# Patient Record
Sex: Male | Born: 1950 | Race: White | Hispanic: No | Marital: Married | Smoking: Former smoker
Health system: Southern US, Community
[De-identification: ages and names within clinical notes are randomized; demographics above are authoritative.]

## PROBLEM LIST (undated history)

## (undated) DIAGNOSIS — K56699 Other intestinal obstruction unspecified as to partial versus complete obstruction: Secondary | ICD-10-CM

## (undated) DIAGNOSIS — K219 Gastro-esophageal reflux disease without esophagitis: Secondary | ICD-10-CM

## (undated) DIAGNOSIS — Z720 Tobacco use: Secondary | ICD-10-CM

## (undated) HISTORY — DX: Tobacco use: Z72.0

## (undated) HISTORY — PX: TONSILLECTOMY: SUR1361

## (undated) HISTORY — PX: VASECTOMY: SHX75

## (undated) HISTORY — DX: Other intestinal obstruction unspecified as to partial versus complete obstruction: K56.699

---

## 2022-03-06 ENCOUNTER — Other Ambulatory Visit: Payer: Self-pay

## 2022-03-06 ENCOUNTER — Emergency Department (HOSPITAL_COMMUNITY)
Admission: EM | Admit: 2022-03-06 | Discharge: 2022-03-06 | Disposition: A | Discharge status: Home / Self Care | Payer: Medicare Other | Attending: Emergency Medicine | Admitting: Emergency Medicine

## 2022-03-06 ENCOUNTER — Encounter (HOSPITAL_COMMUNITY): Payer: Self-pay | Admitting: *Deleted

## 2022-03-06 ENCOUNTER — Emergency Department (HOSPITAL_COMMUNITY): Payer: Medicare Other

## 2022-03-06 DIAGNOSIS — Z7982 Long term (current) use of aspirin: Secondary | ICD-10-CM | POA: Diagnosis not present

## 2022-03-06 DIAGNOSIS — K5900 Constipation, unspecified: Secondary | ICD-10-CM | POA: Diagnosis not present

## 2022-03-06 DIAGNOSIS — R110 Nausea: Secondary | ICD-10-CM | POA: Insufficient documentation

## 2022-03-06 DIAGNOSIS — R1013 Epigastric pain: Secondary | ICD-10-CM | POA: Insufficient documentation

## 2022-03-06 LAB — CBC WITH DIFFERENTIAL/PLATELET
Abs Immature Granulocytes: 0.02 10*3/uL (ref 0.00–0.07)
Basophils Absolute: 0.1 10*3/uL (ref 0.0–0.1)
Basophils Relative: 2 %
Eosinophils Absolute: 0.3 10*3/uL (ref 0.0–0.5)
Eosinophils Relative: 4 %
HCT: 41.3 % (ref 39.0–52.0)
Hemoglobin: 13.8 g/dL (ref 13.0–17.0)
Immature Granulocytes: 0 %
Lymphocytes Relative: 28 %
Lymphs Abs: 2.1 10*3/uL (ref 0.7–4.0)
MCH: 32.3 pg (ref 26.0–34.0)
MCHC: 33.4 g/dL (ref 30.0–36.0)
MCV: 96.7 fL (ref 80.0–100.0)
Monocytes Absolute: 0.6 10*3/uL (ref 0.1–1.0)
Monocytes Relative: 8 %
Neutro Abs: 4.3 10*3/uL (ref 1.7–7.7)
Neutrophils Relative %: 58 %
Platelets: 331 10*3/uL (ref 150–400)
RBC: 4.27 MIL/uL (ref 4.22–5.81)
RDW: 12.7 % (ref 11.5–15.5)
WBC: 7.5 10*3/uL (ref 4.0–10.5)
nRBC: 0 % (ref 0.0–0.2)

## 2022-03-06 LAB — COMPREHENSIVE METABOLIC PANEL
ALT: 8 U/L (ref 0–44)
AST: 17 U/L (ref 15–41)
Albumin: 4 g/dL (ref 3.5–5.0)
Alkaline Phosphatase: 79 U/L (ref 38–126)
Anion gap: 5 (ref 5–15)
BUN: 17 mg/dL (ref 8–23)
CO2: 27 mmol/L (ref 22–32)
Calcium: 9.5 mg/dL (ref 8.9–10.3)
Chloride: 108 mmol/L (ref 98–111)
Creatinine, Ser: 0.97 mg/dL (ref 0.61–1.24)
GFR, Estimated: >60 mL/min (ref >60)
Glucose, Bld: 108 mg/dL — ABNORMAL HIGH (ref 70–99)
Potassium: 4.5 mmol/L (ref 3.5–5.1)
Sodium: 140 mmol/L (ref 135–145)
Total Bilirubin: 1.8 mg/dL — ABNORMAL HIGH (ref 0.3–1.2)
Total Protein: 7.1 g/dL (ref 6.5–8.1)

## 2022-03-06 LAB — LIPASE, BLOOD: Lipase: 42 U/L (ref 11–51)

## 2022-03-06 MED ORDER — ALUM & MAG HYDROXIDE-SIMETH 200-200-20 MG/5ML PO SUSP
30.0000 mL | Freq: Once | ORAL | Status: AC
  Administered 2022-03-06: 30 mL via ORAL
  Filled 2022-03-06: qty 30

## 2022-03-06 MED ORDER — FAMOTIDINE 20 MG PO TABS
20.0000 mg | ORAL_TABLET | Freq: Once | ORAL | Status: AC
  Administered 2022-03-06: 20 mg via ORAL
  Filled 2022-03-06: qty 1

## 2022-03-06 MED ORDER — OMEPRAZOLE 20 MG PO CPDR: 20.0000 mg | DELAYED_RELEASE_CAPSULE | Freq: Every day | ORAL | 0 refills | Status: DC

## 2022-03-06 NOTE — ED Provider Notes (Signed)
?[facility] ?Provider Note ? ? ?CSN: [id number] ?Arrival date & time: 03/06/22  0951 ? ?  ? ?History ? ?Chief Complaint  ?Patient presents with  ? Abdominal Pain  ? ? ?Nicholas Dominguez is a 71 y.o. male. ? ?HPI ? ?Without medical history presents  with complaints of epigastric tenderness.  has been going on for the last year but has gotten more frequent over the last couple weeks, he states pain is mainly his epigastric region described as a cramping like sensation, he notes its worsen after he lays down or is on his side, he notices worsening after large meals or few hours after he eats, he states he will have intermittent nausea without vomiting, he states that he has been slightly constipated last couple days last bowel movement was on Sunday, he has no severe abdominal history, denies alcohol use, does not smoke, he does state that he uses NSAIDs regularly, he has no history of stomach ulcers, no history of pancreatitis, he has no cardiac history, not treated for hypertension or diabetes, he does note that he does not frequent the doctor often.  He denies ever having any chest pain shortness of breath does not describe this pain as a stabbing or tearing like sensation does not rating to the back. ? ?Wife is at bedside able to validate the story. ? ?Home Medications ?Prior to Admission medications   ?Medication Sig Start Date End Date Taking? Authorizing Provider  ?aspirin 81 MG EC tablet Take 81 mg by mouth daily.   Yes [provider]  ?Multiple Vitamin (MULTI VITAMIN) TABS Take 1 tablet by mouth daily.   Yes [provider]  ?niacin 50 MG tablet Take 1 tablet by mouth daily.   Yes [provider]  ?Omega-3 Fatty Acids (FISH OIL) 1000 MG CAPS Take 1 capsule by mouth daily.   Yes [provider]  ?omeprazole (PRILOSEC) 20 MG capsule Take 1 capsule (20 mg total) by mouth daily. 03/06/22 04/05/22 Yes Marcello Fennel, PA-C  ?   ? ?Allergies    ?Patient has no  known allergies.   ? ?Review of Systems   ?Review of Systems  ?Constitutional:  Negative for chills and fever.  ?Respiratory:  Negative for shortness of breath.   ?Cardiovascular:  Negative for chest pain.  ?Gastrointestinal:  Positive for abdominal pain and constipation. Negative for nausea and vomiting.  ?Neurological:  Negative for headaches.  ? ?Physical Exam ?Updated Vital Signs ?BP 135/71   Pulse 71   Temp 97.8 ?F (36.6 ?C) (Oral)   Resp 17   Ht '5\' 11"'$  (1.803 m)   Wt 89.1 kg   SpO2 98%   BMI 27.41 kg/m?  ?Physical Exam ?Vitals and nursing note reviewed.  ?Constitutional:   ?   General: He is not in acute distress. ?   Appearance: He is not ill-appearing.  ?HENT:  ?   Head: Normocephalic and atraumatic.  ?   Nose: No congestion.  ?Eyes:  ?   Conjunctiva/sclera: Conjunctivae normal.  ?Cardiovascular:  ?   Rate and Rhythm: Normal rate and regular rhythm.  ?   Pulses: Normal pulses.  ?   Heart sounds: No murmur heard. ?  No friction rub. No gallop.  ?Pulmonary:  ?   Effort: No respiratory distress.  ?   Breath sounds: No wheezing, rhonchi or rales.  ?Abdominal:  ?   Palpations: Abdomen is soft.  ?   Tenderness: There is abdominal tenderness. There is no right CVA  tenderness or left CVA tenderness.  ?   Comments: Abdomen nondistended no active bowel sounds dull to percussion, there is no guarding rebound tenderness or peritoneal sign negative Murphy sign McBurney point, has slight tenderness in the lower abdomen.  ?Musculoskeletal:  ?   Right lower leg: No edema.  ?   Left lower leg: No edema.  ?Skin: ?   General: Skin is warm and dry.  ?Neurological:  ?   Mental Status: He is alert.  ?Psychiatric:     ?   Mood and Affect: Mood normal.  ? ? ?ED Results / Procedures / Treatments   ?Labs ?(all labs ordered are listed, but only abnormal results are displayed) ?Labs Reviewed  ?COMPREHENSIVE METABOLIC PANEL - Abnormal; Notable for the following components:  ?    Result Value  ? Glucose, Bld 108 (*)   ? Total  Bilirubin 1.8 (*)   ? All other components within normal limits  ?CBC WITH DIFFERENTIAL/PLATELET  ?LIPASE, BLOOD  ? ? ?EKG ?EKG Interpretation ? ?Date/Time:  Monday Mar 06 2022 11:08:32 EDT ?Ventricular Rate:  72 ?PR Interval:  188 ?QRS Duration: 102 ?QT Interval:  413 ?QTC Calculation: 452 ?R Axis:   51 ?Text Interpretation: Sinus rhythm No old tracing to compare Confirmed by Daleen Bo [phone]) on 03/06/2022 11:23:50 AM ? ?Radiology ?DG Abdomen Acute W/Chest ? ?Result Date: 03/06/2022 ?CLINICAL DATA:  Chest pain, epigastric pain EXAM: DG ABDOMEN ACUTE WITH 1 VIEW CHEST COMPARISON:  None Available. FINDINGS: Cardiac size is within normal limits. Left hemidiaphragm is elevated. Lung fields are clear of any infiltrates or pulmonary edema. There is no pleural effusion or pneumothorax. Bowel gas pattern is nonspecific. There is no pneumoperitoneum. Small amount of stool is seen in the colon. There is no fecal impaction in the rectosigmoid. No abnormal masses or calcifications are seen. Kidneys are partly obscured by bowel contents. Degenerative changes are noted in the lumbar spine. IMPRESSION: Nonspecific bowel gas pattern. No abnormal masses or calcifications are seen. There are no focal pulmonary infiltrates. Electronically Signed   By: Elmer Picker M.D.   On: 03/06/2022 11:51   ? ?Procedures ?Procedures  ? ? ?Medications Ordered in ED ?Medications  ?alum & mag hydroxide-simeth (MAALOX/MYLANTA) 200-200-20 MG/5ML suspension 30 mL (30 mLs Oral Given 03/06/22 1104)  ?famotidine (PEPCID) tablet 20 mg (20 mg Oral Given 03/06/22 1104)  ? ? ?ED Course/ Medical Decision Making/ A&P ?  ?                        ?Medical Decision Making ?Amount and/or Complexity of Data Reviewed ?Labs: ordered. ?Radiology: ordered. ? ?Risk ?OTC drugs. ? ? ?This patient presents to the ED for concern of epigastric pain, this involves an extensive number of treatment options, and is a complaint that carries with it a high risk of complications  and morbidity.  The differential diagnosis includes ACS, dissection, GERD, obstruction ? ? ? ?Additional history obtained: ? ?Additional history obtained from wife at bedside ?External records from outside source obtained and reviewed including N/A ? ? ?Co morbidities that complicate the patient evaluation ? ?N/A ? ?Social Determinants of Health: ? ?Does not see a primary care provider-we will provide 1 after discharge ? ? ? ?Lab Tests: ? ?I Ordered, and personally interpreted labs.  The pertinent results include: CBC unremarkable, CMP showed glucose of 108, T. bili 1.8, lipase 42 ? ? ?Imaging Studies ordered: ? ?I ordered imaging studies including acute chest abdomen ?I independently  visualized and interpreted imaging which showed negative acute findings ?I agree with the radiologist interpretation ? ? ?Cardiac Monitoring: ? ?The patient was maintained on a cardiac monitor.  I personally viewed and interpreted the cardiac monitored which showed an underlying rhythm of: EKG without signs of ischemia ? ? ?Medicines ordered and prescription drug management: ? ?I ordered medication including GI cocktail, Pepcid ?I have reviewed the patients home medicines and have made adjustments as needed ? ?Critical Interventions: ? ?N/A ? ? ?Reevaluation: ? ?Presents with epigastric tenderness, he has slight tenderness in his lower abdomen, endorses constipation, unclear etiology suspects likely GERD/hiatal hernia will obtain basic lab work-up imaging provide GI cocktail and reassess. ? ?Reassessed resting comfortably having no complaints, he is ready for discharge. ? ? ?Consultations Obtained: ? ?N/A ? ? ? ?Test Considered: ? ?CT and pelvis-we will defer as my suspicion for intra-abdominal abnormality is very low at this time, he has nonsurgical abdomen, abdomen soft nontender, lab work is unremarkable. ? ? ? ?Rule out ?I have low suspicion for ACS he denies any chest pain, shortness of breath, he has low risk factors, EKG without  signs of ischemia.  I have low suspicion for AAA/dissection as presentation atypical, no bulging mass, pain is not midline, does not rating to the back, is provoked with p.o. intake.low suspicion for lower lobe pn

## 2022-03-06 NOTE — ED Triage Notes (Signed)
Pt c/o epigastric pain that has gotten worse; pt describes the pain as a cramping feeling and states he has been belching more than usual ?

## 2022-03-06 NOTE — Discharge Instructions (Signed)
Lab work and exam are reassuring, possible you have GERD/hiatal hernia, start you on an acid pill please take as prescribed. ? ?Regards to your constipation  recommend taking MiraLAX 1 capful daily for the next 7 days please hydrate as this will only work if you are hydrated. ? ?Please follow-up with GI for further evaluation. ? ?Come back to the emergency department if you develop chest pain, shortness of breath, severe abdominal pain, uncontrolled nausea, vomiting, diarrhea. ? ?

## 2022-03-06 NOTE — ED Provider Notes (Signed)
?  Face-to-face evaluation ? ? ?History: Patient presenting with ongoing symptoms, worsening in frequency consistent with dyspepsia.  He is taking omeprazole without relief.  He occasionally uses Pepto-Bismol with partial relief.  Symptoms tend to occur more at nighttime ? ?Physical exam: Alert, calm, cooperative.  No respiratory distress.  Abdomen soft and nontender.  Findings discussed with the patient and all questions were answered ? ?MDM: Evaluation for  ?Chief Complaint  ?Patient presents with  ? Abdominal Pain  ?  ? ?Patient with signs and symptoms of GERD, ongoing for several weeks.  He is currently taking omeprazole without relief.  He is stable for discharge.  Will increase treatment regimen with Protonix twice daily and supplement that with antacid of choice.  Referred to GI for further evaluation and possible EGD ? ?Medical screening examination/treatment/procedure(s) were conducted as a shared visit with non-physician practitioner(s) and myself.  I personally evaluated the patient during the encounter ? ?  ?Daleen Bo, MD ?03/07/22 1524 ? ?

## 2022-03-07 ENCOUNTER — Telehealth: Payer: Self-pay | Admitting: Internal Medicine

## 2022-03-07 NOTE — Telephone Encounter (Signed)
ER REFERRAL  

## 2022-03-15 ENCOUNTER — Ambulatory Visit (INDEPENDENT_AMBULATORY_CARE_PROVIDER_SITE_OTHER): Payer: Medicare Other | Admitting: Internal Medicine

## 2022-03-15 ENCOUNTER — Encounter: Payer: Self-pay | Admitting: Internal Medicine

## 2022-03-15 ENCOUNTER — Other Ambulatory Visit: Payer: Self-pay

## 2022-03-15 VITALS — BP 130/77 | HR 74 | Temp 97.5°F | Ht 71.0 in | Wt 193.6 lb

## 2022-03-15 DIAGNOSIS — G8929 Other chronic pain: Secondary | ICD-10-CM

## 2022-03-15 DIAGNOSIS — K219 Gastro-esophageal reflux disease without esophagitis: Secondary | ICD-10-CM | POA: Diagnosis not present

## 2022-03-15 DIAGNOSIS — Z1211 Encounter for screening for malignant neoplasm of colon: Secondary | ICD-10-CM | POA: Diagnosis not present

## 2022-03-15 DIAGNOSIS — R197 Diarrhea, unspecified: Secondary | ICD-10-CM

## 2022-03-15 DIAGNOSIS — R1013 Epigastric pain: Secondary | ICD-10-CM | POA: Diagnosis not present

## 2022-03-15 DIAGNOSIS — R634 Abnormal weight loss: Secondary | ICD-10-CM

## 2022-03-15 NOTE — H&P (View-Only) (Signed)
Primary Care Physician:  Pcp, No Primary Gastroenterologist:  Dr. Abbey Chatters  Chief Complaint  Patient presents with   Abdominal Pain    Was given omeprazole 20 mg once daily in the ER. Was seeing some improvement until Sat when he went to [employer] and ate [place].     HPI:   Nicholas Dominguez is a 71 y.o. male who presents to clinic today for ER referral.  Patient states he has had abdominal pain new onset for the last month.  Primarily epigastric and periumbilical.  Comes and goes though has progressively worsened to now as it is occurring every day.  Also notes change in bowel movements with constipation and diarrhea.  No melena hematochezia.  Does note nearly 30 pound weight loss.  States his appetite is poor.  Eating does make his symptoms worse.  Has been on omeprazole which does not seem to help.  No dysphagia or odynophagia.  Does notice occasional reflux at night.  Symptoms of gotten so bad he cannot lay flat at times.  Work-up in the ER unremarkable including blood work.  Abdominal x-ray without obstruction.  No nausea or vomiting.  No previous upper endoscopy or colonoscopy.  No colon cancer modality.  Relatively healthy, does not take any medications besides a daily baby aspirin.  No family history of colorectal malignancy.  Past Medical History:  Diagnosis Date   Tobacco use     Past Surgical History:  Procedure Laterality Date   TONSILLECTOMY     VASECTOMY      Current Outpatient Medications  Medication Sig Dispense Refill   aspirin 81 MG EC tablet Take 81 mg by mouth daily.     Multiple Vitamin (MULTI VITAMIN) TABS Take 1 tablet by mouth daily.     niacin 500 MG tablet Take 1 tablet by mouth daily.     Omega-3 Fatty Acids (FISH OIL) 1000 MG CAPS Take 1 capsule by mouth daily.     omeprazole (PRILOSEC) 20 MG capsule Take 1 capsule (20 mg total) by mouth daily. 30 capsule 0   No current facility-administered medications for this visit.    Allergies as of 03/15/2022    (No Known Allergies)    Family History  Problem Relation Age of Onset   Heart disease Mother    Prostate cancer Father    Stroke Father     Social History   Socioeconomic History   Marital status: Married    Spouse name: Not on file   Number of children: Not on file   Years of education: Not on file   Highest education level: Not on file  Occupational History   Not on file  Tobacco Use   Smoking status: Some Days    Packs/day: 0.25    Types: Cigarettes   Smokeless tobacco: Never  Vaping Use   Vaping Use: Never used  Substance and Sexual Activity   Alcohol use: Not Currently   Drug use: Not Currently   Sexual activity: Yes  Other Topics Concern   Not on file  Social History Narrative   Not on file   Social Determinants of Health   Financial Resource Strain: Not on file  Food Insecurity: Not on file  Transportation Needs: Not on file  Physical Activity: Not on file  Stress: Not on file  Social Connections: Not on file  Intimate Partner Violence: Not on file    Subjective: Review of Systems  Constitutional:  Negative for chills and fever.  HENT:  Negative for congestion and hearing loss.   Eyes:  Negative for blurred vision and double vision.  Respiratory:  Negative for cough and shortness of breath.   Cardiovascular:  Negative for chest pain and palpitations.  Gastrointestinal:  Positive for abdominal pain. Negative for blood in stool, constipation, diarrhea, heartburn, melena and vomiting.  Genitourinary:  Negative for dysuria and urgency.  Musculoskeletal:  Negative for joint pain and myalgias.  Skin:  Negative for itching and rash.  Neurological:  Negative for dizziness and headaches.  Psychiatric/Behavioral:  Negative for depression. The patient is not nervous/anxious.       Objective: BP 130/77 (BP Location: Right Arm, Patient Position: Sitting, Cuff Size: Normal)   Pulse 74   Temp (!) 97.5 F (36.4 C) (Temporal)   Ht '5\' 11"'$  (1.803 m)   Wt 193  lb 9.6 oz (87.8 kg)   SpO2 99%   BMI 27.00 kg/m  Physical Exam Constitutional:      Appearance: Normal appearance.  HENT:     Head: Normocephalic and atraumatic.  Eyes:     Extraocular Movements: Extraocular movements intact.     Conjunctiva/sclera: Conjunctivae normal.  Cardiovascular:     Rate and Rhythm: Normal rate and regular rhythm.  Pulmonary:     Effort: Pulmonary effort is normal.     Breath sounds: Normal breath sounds.  Abdominal:     General: Bowel sounds are normal.     Palpations: Abdomen is soft.     Tenderness: There is abdominal tenderness.  Musculoskeletal:        General: Normal range of motion.     Cervical back: Normal range of motion and neck supple.  Skin:    General: Skin is warm.  Neurological:     General: No focal deficit present.     Mental Status: He is alert and oriented to person, place, and time.  Psychiatric:        Mood and Affect: Mood normal.        Behavior: Behavior normal.     Assessment: *Abdominal pain-worsening *GERD *Weight loss *Colon cancer screening *Constipation mixed with diarrhea  Plan: Etiology of patient's symptoms unclear.  Given the acute worsening nature of his symptoms as well as abdominal tenderness today, will order CT abdomen pelvis with contrast to further evaluate.  If unremarkable we will proceed with EGD to evaluate for peptic ulcer disease, esophagitis, gastritis, H. Pylori, duodenitis, or other. Will also evaluate for esophageal stricture, Schatzki's ring, esophageal web or other.   At the same time, I will perform colonoscopy for both colon cancer screening purposes as well as to evaluate for weight loss, change in bowel habits.  The risks including infection, bleed, or perforation as well as benefits, limitations, alternatives and imponderables have been reviewed with the patient. Potential for esophageal dilation, biopsy, etc. have also been reviewed.  Questions have been answered. All parties  agreeable.  Continue on omeprazole daily for now.  May need to make adjustments pending above work-up.  03/15/2022 2:16 PM   Disclaimer: This note was dictated with voice recognition software. Similar sounding words can inadvertently be transcribed and may not be corrected upon review.

## 2022-03-15 NOTE — Progress Notes (Signed)
? ? ?Primary Care Physician:  Pcp, No ?Primary Gastroenterologist:  Dr. Abbey Chatters ? ?Chief Complaint  ?Patient presents with  ? Abdominal Pain  ?  Was given omeprazole 20 mg once daily in the ER. Was seeing some improvement until Sat when he went to [employer] and ate [place].   ? ? ?HPI:   ?Nicholas Dominguez is a 71 y.o. male who presents to clinic today for ER referral.  Patient states he has had abdominal pain new onset for the last month.  Primarily epigastric and periumbilical.  Comes and goes though has progressively worsened to now as it is occurring every day.  Also notes change in bowel movements with constipation and diarrhea.  No melena hematochezia.  Does note nearly 30 pound weight loss.  States his appetite is poor.  Eating does make his symptoms worse.  Has been on omeprazole which does not seem to help.  No dysphagia or odynophagia.  Does notice occasional reflux at night.  Symptoms of gotten so bad he cannot lay flat at times.  Work-up in the ER unremarkable including blood work.  Abdominal x-ray without obstruction.  No nausea or vomiting.  No previous upper endoscopy or colonoscopy.  No colon cancer modality.  Relatively healthy, does not take any medications besides a daily baby aspirin.  No family history of colorectal malignancy. ? ?Past Medical History:  ?Diagnosis Date  ? Tobacco use   ? ? ?Past Surgical History:  ?Procedure Laterality Date  ? TONSILLECTOMY    ? VASECTOMY    ? ? ?Current Outpatient Medications  ?Medication Sig Dispense Refill  ? aspirin 81 MG EC tablet Take 81 mg by mouth daily.    ? Multiple Vitamin (MULTI VITAMIN) TABS Take 1 tablet by mouth daily.    ? niacin 500 MG tablet Take 1 tablet by mouth daily.    ? Omega-3 Fatty Acids (FISH OIL) 1000 MG CAPS Take 1 capsule by mouth daily.    ? omeprazole (PRILOSEC) 20 MG capsule Take 1 capsule (20 mg total) by mouth daily. 30 capsule 0  ? ?No current facility-administered medications for this visit.  ? ? ?Allergies as of 03/15/2022  ?  (No Known Allergies)  ? ? ?Family History  ?Problem Relation Age of Onset  ? Heart disease Mother   ? Prostate cancer Father   ? Stroke Father   ? ? ?Social History  ? ?Socioeconomic History  ? Marital status: Married  ?  Spouse name: Not on file  ? Number of children: Not on file  ? Years of education: Not on file  ? Highest education level: Not on file  ?Occupational History  ? Not on file  ?Tobacco Use  ? Smoking status: Some Days  ?  Packs/day: 0.25  ?  Types: Cigarettes  ? Smokeless tobacco: Never  ?Vaping Use  ? Vaping Use: Never used  ?Substance and Sexual Activity  ? Alcohol use: Not Currently  ? Drug use: Not Currently  ? Sexual activity: Yes  ?Other Topics Concern  ? Not on file  ?Social History Narrative  ? Not on file  ? ?Social Determinants of Health  ? ?Financial Resource Strain: Not on file  ?Food Insecurity: Not on file  ?Transportation Needs: Not on file  ?Physical Activity: Not on file  ?Stress: Not on file  ?Social Connections: Not on file  ?Intimate Partner Violence: Not on file  ? ? ?Subjective: ?Review of Systems  ?Constitutional:  Negative for chills and fever.  ?HENT:  Negative for congestion and hearing loss.   ?Eyes:  Negative for blurred vision and double vision.  ?Respiratory:  Negative for cough and shortness of breath.   ?Cardiovascular:  Negative for chest pain and palpitations.  ?Gastrointestinal:  Positive for abdominal pain. Negative for blood in stool, constipation, diarrhea, heartburn, melena and vomiting.  ?Genitourinary:  Negative for dysuria and urgency.  ?Musculoskeletal:  Negative for joint pain and myalgias.  ?Skin:  Negative for itching and rash.  ?Neurological:  Negative for dizziness and headaches.  ?Psychiatric/Behavioral:  Negative for depression. The patient is not nervous/anxious.    ? ? ? ?Objective: ?BP 130/77 (BP Location: Right Arm, Patient Position: Sitting, Cuff Size: Normal)   Pulse 74   Temp (!) 97.5 ?F (36.4 ?C) (Temporal)   Ht '5\' 11"'$  (1.803 m)   Wt 193  lb 9.6 oz (87.8 kg)   SpO2 99%   BMI 27.00 kg/m?  ?Physical Exam ?Constitutional:   ?   Appearance: Normal appearance.  ?HENT:  ?   Head: Normocephalic and atraumatic.  ?Eyes:  ?   Extraocular Movements: Extraocular movements intact.  ?   Conjunctiva/sclera: Conjunctivae normal.  ?Cardiovascular:  ?   Rate and Rhythm: Normal rate and regular rhythm.  ?Pulmonary:  ?   Effort: Pulmonary effort is normal.  ?   Breath sounds: Normal breath sounds.  ?Abdominal:  ?   General: Bowel sounds are normal.  ?   Palpations: Abdomen is soft.  ?   Tenderness: There is abdominal tenderness.  ?Musculoskeletal:     ?   General: Normal range of motion.  ?   Cervical back: Normal range of motion and neck supple.  ?Skin: ?   General: Skin is warm.  ?Neurological:  ?   General: No focal deficit present.  ?   Mental Status: He is alert and oriented to person, place, and time.  ?Psychiatric:     ?   Mood and Affect: Mood normal.     ?   Behavior: Behavior normal.  ? ? ? ?Assessment: ?*Abdominal pain-worsening ?*GERD ?*Weight loss ?*Colon cancer screening ?*Constipation mixed with diarrhea ? ?Plan: ?Etiology of patient's symptoms unclear.  Given the acute worsening nature of his symptoms as well as abdominal tenderness today, will order CT abdomen pelvis with contrast to further evaluate. ? ?If unremarkable we will proceed with EGD to evaluate for peptic ulcer disease, esophagitis, gastritis, H. Pylori, duodenitis, or other. Will also evaluate for esophageal stricture, Schatzki's ring, esophageal web or other.  ? ?At the same time, I will perform colonoscopy for both colon cancer screening purposes as well as to evaluate for weight loss, change in bowel habits. ? ?The risks including infection, bleed, or perforation as well as benefits, limitations, alternatives and imponderables have been reviewed with the patient. Potential for esophageal dilation, biopsy, etc. have also been reviewed.  Questions have been answered. All parties  agreeable. ? ?Continue on omeprazole daily for now.  May need to make adjustments pending above work-up. ? ?03/15/2022 2:16 PM ? ? ?Disclaimer: This note was dictated with voice recognition software. Similar sounding words can inadvertently be transcribed and may not be corrected upon review. ? ?

## 2022-03-15 NOTE — Progress Notes (Unsigned)
T

## 2022-03-15 NOTE — Patient Instructions (Signed)
I am going to order CT of your abdomen pelvis to further evaluate your abdominal pain.  We will call you with these results. ? ?If this is unremarkable, we will proceed with upper endoscopy and colonoscopy to further evaluate. ? ?Continue on omeprazole daily for now.  We may need to make adjustments pending above work-up. ? ?If you have acute worsening of your symptoms, would recommend you go to the ER for further evaluation. ? ?It was very nice meeting both of you today. ? ?Dr. Abbey Chatters ? ?At [facility] we value your feedback. You may receive a survey about your visit today. Please share your experience as we strive to create trusting relationships with our patients to provide genuine, compassionate, quality care. ? ?We appreciate your understanding and patience as we review any laboratory studies, imaging, and other diagnostic tests that are ordered as we care for you. Our office policy is 5 business days for review of these results, and any emergent or urgent results are addressed in a timely manner for your best interest. If you do not hear from our office in 1 week, please contact us.  ? ?We also encourage the use of MyChart, which contains your medical information for your review as well. If you are not enrolled in this feature, an access code is on this after visit summary for your convenience. Thank you for allowing [place] to be involved in your care. ? ?It was great to see you today!  I hope you have a great rest of your Spring! ? ? ? ?Nicholas Dominguez. Abbey Chatters, D.O. ?Gastroenterology and Hepatology ?[facility] ? ?

## 2022-03-22 ENCOUNTER — Ambulatory Visit (HOSPITAL_COMMUNITY)
Admission: RE | Admit: 2022-03-22 | Discharge: 2022-03-22 | Disposition: A | Discharge status: Home / Self Care | Payer: Medicare Other | Source: Ambulatory Visit | Attending: Internal Medicine | Admitting: Internal Medicine

## 2022-03-22 ENCOUNTER — Encounter (HOSPITAL_COMMUNITY): Payer: Self-pay | Admitting: Radiology

## 2022-03-22 DIAGNOSIS — R634 Abnormal weight loss: Secondary | ICD-10-CM | POA: Insufficient documentation

## 2022-03-22 DIAGNOSIS — R197 Diarrhea, unspecified: Secondary | ICD-10-CM | POA: Diagnosis present

## 2022-03-22 DIAGNOSIS — G8929 Other chronic pain: Secondary | ICD-10-CM | POA: Diagnosis present

## 2022-03-22 DIAGNOSIS — R1013 Epigastric pain: Secondary | ICD-10-CM | POA: Insufficient documentation

## 2022-03-22 MED ORDER — IOHEXOL 300 MG/ML  SOLN
100.0000 mL | Freq: Once | INTRAMUSCULAR | Status: AC | PRN
  Administered 2022-03-22: 100 mL via INTRAVENOUS

## 2022-03-29 ENCOUNTER — Telehealth: Payer: Self-pay

## 2022-03-29 NOTE — Telephone Encounter (Signed)
Nicholas Dominguez please let patient know CT unremarkable from a GI standpoint, need to proceed with EGD and colonoscopy to further evaluate.  [name] can we schedule patient for EGD and colonoscopy, diagnosis weight loss, abdominal pain, diarrhea, ASA 3.

## 2022-03-29 NOTE — Telephone Encounter (Signed)
Pt and his wife called to find out the results of CT scan. Please advise

## 2022-03-30 MED ORDER — PEG 3350-KCL-NA BICARB-NACL 420 G PO SOLR: ORAL | 0 refills | Status: DC

## 2022-03-30 NOTE — Telephone Encounter (Signed)
Noted  

## 2022-03-30 NOTE — Telephone Encounter (Signed)
Spoke with spouse and is aware of pre-op appt details. She voiced understanding

## 2022-03-30 NOTE — Addendum Note (Signed)
Addended by: Cheron Every on: 03/30/2022 08:40 AM   Modules accepted: Orders

## 2022-03-30 NOTE — Telephone Encounter (Addendum)
Spoke with spouse. Aware of results. Patient scheduled for procedure 6/8 at 11am. Aware will send instructions with prep Rx to pharmacy. Will call back with pre-op appt. Spouse aware to call back if [facility] does not give her the instructions.

## 2022-03-31 NOTE — Patient Instructions (Signed)
Nicholas Dominguez  03/31/2022     '@[email or web address]'$ @   Your procedure is scheduled on 04/06/22.  Report to [facility] at 0930 A.M.  Call this number if you have problems the morning of surgery:  [phone]   Remember:  Do not eat or drink after midnight.   Take these medicines the morning of surgery with A SIP OF WATER prilosec.    Do not wear jewelry, make-up or nail polish.  Do not wear lotions, powders, or perfumes, or deodorant.  Do not shave 48 hours prior to surgery.  Men may shave face and neck.  Do not bring valuables to the hospital.  [facility] is not responsible for any belongings or valuables.  Contacts, dentures or bridgework may not be worn into surgery.  Leave your suitcase in the car.  After surgery it may be brought to your room.  For patients admitted to the hospital, discharge time will be determined by your treatment team.  Patients discharged the day of surgery will not be allowed to drive home.   Name and phone number of your driver:   family  Special instructions:  FOLLOW DIET & PREP INSTRUCTIONS THAT WAS PROVIDED FROM THE DOCTOR'S OFFICE.  Please read over the following fact sheets that you were given. Anesthesia Post-op Instructions and Care and Recovery After Surgery      PATIENT INSTRUCTIONS POST-ANESTHESIA  IMMEDIATELY FOLLOWING SURGERY:  Do not drive or operate machinery for the first twenty four hours after surgery.  Do not make any important decisions for twenty four hours after surgery or while taking narcotic pain medications or sedatives.  If you develop intractable nausea and vomiting or a severe headache please notify your doctor immediately.  FOLLOW-UP:  Please make an appointment with your surgeon as instructed. You do not need to follow up with anesthesia unless specifically instructed to do so.  WOUND CARE INSTRUCTIONS (if applicable):  Keep a dry clean dressing on the anesthesia/puncture wound site if there is drainage.  Once the  wound has quit draining you may leave it open to air.  Generally you should leave the bandage intact for twenty four hours unless there is drainage.  If the epidural site drains for more than 36-48 hours please call the anesthesia department.  QUESTIONS?:  Please feel free to call your physician or the hospital operator if you have any questions, and they will be happy to assist you.      Colonoscopy, Adult A colonoscopy is a procedure to look at the entire large intestine. This procedure is done using a long, thin, flexible tube that has a camera on the end. You may have a colonoscopy: As a part of normal colorectal screening. If you have certain symptoms, such as: A low number of red blood cells in your blood (anemia). Diarrhea that does not go away. Pain in your abdomen. Blood in your stool. A colonoscopy can help screen for and diagnose medical problems, including: An abnormal growth of cells or tissue (tumor). Abnormal growths within the lining of your intestine (polyps). Inflammation. Areas of bleeding. Tell your health care provider about: Any allergies you have. All medicines you are taking, including vitamins, herbs, eye drops, creams, and over-the-counter medicines. Any problems you or family members have had with anesthetic medicines. Any bleeding problems you have. Any surgeries you have had. Any medical conditions you have. Any problems you have had with having bowel movements. Whether you are pregnant or may be pregnant. What are the risks?  Generally, this is a safe procedure. However, problems may occur, including: Bleeding. Damage to your intestine. Allergic reactions to medicines given during the procedure. Infection. This is rare. What happens before the procedure? Eating and drinking restrictions Follow instructions from your health care provider about eating or drinking restrictions, which may include: A few days before the procedure: Follow a low-fiber  diet. Avoid nuts, seeds, dried fruit, raw fruits, and vegetables. 1-3 days before the procedure: Eat only gelatin dessert or ice pops. Drink only clear liquids, such as water, clear juice, clear broth or bouillon, black coffee or tea, or clear soft drinks or sports drinks. Avoid liquids that contain red or purple dye. The day of the procedure: Do not eat solid foods. You may continue to drink clear liquids until up to 2 hours before the procedure. Do not eat or drink anything starting 2 hours before the procedure, or within the time period that your health care provider recommends. Bowel prep If you were prescribed a bowel prep to take by mouth (orally) to clean out your colon: Take it as told by your health care provider. Starting the day before your procedure, you will need to drink a large amount of liquid medicine. The liquid will cause you to have many bowel movements of loose stool until your stool becomes almost clear or light green. If your skin or the opening between the buttocks (anus) gets irritated from diarrhea, you may relieve the irritation using: Wipes with medicine in them, such as adult wet wipes with aloe and vitamin E. A product to soothe skin, such as petroleum jelly. If you vomit while drinking the bowel prep: Take a break for up to 60 minutes. Begin the bowel prep again. Call your health care provider if you keep vomiting or you cannot take the bowel prep without vomiting. To clean out your colon, you may also be given: Laxative medicines. These help you have a bowel movement. Instructions for enema use. An enema is liquid medicine injected into your rectum. Medicines Ask your health care provider about: Changing or stopping your regular medicines or supplements. This is especially important if you are taking iron supplements, diabetes medicines, or blood thinners. Taking medicines such as aspirin and ibuprofen. These medicines can thin your blood. Do not take these  medicines unless your health care provider tells you to take them. Taking over-the-counter medicines, vitamins, herbs, and supplements. General instructions Ask your health care provider what steps will be taken to help prevent infection. These may include washing skin with a germ-killing soap. If you will be going home right after the procedure, plan to have a responsible adult: Take you home from the hospital or clinic. You will not be allowed to drive. Care for you for the time you are told. What happens during the procedure?  An IV will be inserted into one of your veins. You will be given a medicine to make you fall asleep (general anesthetic). You will lie on your side with your knees bent. A lubricant will be put on the tube. Then the tube will be: Inserted into your anus. Gently eased through all parts of your large intestine. Air will be sent into your colon to keep it open. This may cause some pressure or cramping. Images will be taken with the camera and will appear on a screen. A small tissue sample may be removed to be looked at under a microscope (biopsy). The tissue may be sent to a lab for testing if any  signs of problems are found. If small polyps are found, they may be removed and checked for cancer cells. When the procedure is finished, the tube will be removed. The procedure may vary among health care providers and hospitals. What happens after the procedure? Your blood pressure, heart rate, breathing rate, and blood oxygen level will be monitored until you leave the hospital or clinic. You may have a small amount of blood in your stool. You may pass gas and have mild cramping or bloating in your abdomen. This is caused by the air that was used to open your colon during the exam. If you were given a sedative during the procedure, it can affect you for several hours. Do not drive or operate machinery until your health care provider says that it is safe. It is up to you to  get the results of your procedure. Ask your health care provider, or the department that is doing the procedure, when your results will be ready. Summary A colonoscopy is a procedure to look at the entire large intestine. Follow instructions from your health care provider about eating and drinking before the procedure. If you were prescribed an oral bowel prep to clean out your colon, take it as told by your health care provider. During the colonoscopy, a flexible tube with a camera on its end is inserted into the anus and then passed into all parts of the large intestine. This information is not intended to replace advice given to you by your health care provider. Make sure you discuss any questions you have with your health care provider. Document Revised: 10/10/2021 Document Reviewed: 06/08/2021 Elsevier Patient Education  [place]. Colonoscopy, Adult, Care After The following information offers guidance on how to care for yourself after your procedure. Your health care provider may also give you more specific instructions. If you have problems or questions, contact your health care provider. What can I expect after the procedure? After the procedure, it is common to have: A small amount of blood in your stool for 24 hours after the procedure. Some gas. Mild cramping or bloating of your abdomen. Follow these instructions at home: Eating and drinking  Drink enough fluid to keep your urine pale yellow. Follow instructions from your health care provider about eating or drinking restrictions. Resume your normal diet as told by your health care provider. Avoid heavy or fried foods that are hard to digest. Activity Rest as told by your health care provider. Avoid sitting for a long time without moving. Get up to take short walks every 1-2 hours. This is important to improve blood flow and breathing. Ask for help if you feel weak or unsteady. Return to your normal activities as told by  your health care provider. Ask your health care provider what activities are safe for you. Managing cramping and bloating  Try walking around when you have cramps or feel bloated. If directed, apply heat to your abdomen as told by your health care provider. Use the heat source that your health care provider recommends, such as a moist heat pack or a heating pad. Place a towel between your skin and the heat source. Leave the heat on for 20-30 minutes. Remove the heat if your skin turns bright red. This is especially important if you are unable to feel pain, heat, or cold. You have a greater risk of getting burned. General instructions If you were given a sedative during the procedure, it can affect you for several hours. Do  not drive or operate machinery until your health care provider says that it is safe. For the first 24 hours after the procedure: Do not sign important documents. Do not drink alcohol. Do your regular daily activities at a slower pace than normal. Eat soft foods that are easy to digest. Take over-the-counter and prescription medicines only as told by your health care provider. Keep all follow-up visits. This is important. Contact a health care provider if: You have blood in your stool 2-3 days after the procedure. Get help right away if: You have more than a small spotting of blood in your stool. You have large blood clots in your stool. You have swelling of your abdomen. You have nausea or vomiting. You have a fever. You have increasing pain in your abdomen that is not relieved with medicine. These symptoms may be an emergency. Get help right away. Call 911. Do not wait to see if the symptoms will go away. Do not drive yourself to the hospital. Summary After the procedure, it is common to have a small amount of blood in your stool. You may also have mild cramping and bloating of your abdomen. If you were given a sedative during the procedure, it can affect you for  several hours. Do not drive or operate machinery until your health care provider says that it is safe. Get help right away if you have a lot of blood in your stool, nausea or vomiting, a fever, or increased pain in your abdomen. This information is not intended to replace advice given to you by your health care provider. Make sure you discuss any questions you have with your health care provider. Document Revised: 06/08/2021 Document Reviewed: 06/08/2021 Elsevier Patient Education  [place] Endoscopy, Adult Upper endoscopy is a procedure to look inside the upper GI (gastrointestinal) tract. The upper GI tract is made up of: The esophagus. This is the part of the body that moves food from your mouth to your stomach. The stomach. The duodenum. This is the first part of your small intestine. This procedure is also called esophagogastroduodenoscopy (EGD) or gastroscopy. In this procedure, your health care provider passes a thin, flexible tube (endoscope) through your mouth and down your esophagus into your stomach and into your duodenum. A small camera is attached to the end of the tube. Images from the camera appear on a monitor in the exam room. During this procedure, your health care provider may also remove a small piece of tissue to be sent to a lab and examined under a microscope (biopsy). Your health care provider may do an upper endoscopy to diagnose cancers of the upper GI tract. You may also have this procedure to find the cause of other conditions, such as: Stomach pain. Heartburn. Pain or problems when swallowing. Nausea and vomiting. Stomach bleeding. Stomach ulcers. Tell a health care provider about: Any allergies you have. All medicines you are taking, including vitamins, herbs, eye drops, creams, and over-the-counter medicines. Any problems you or family members have had with anesthetic medicines. Any bleeding problems you have. Any surgeries you have had. Any  medical conditions you have. Whether you are pregnant or may be pregnant. What are the risks? Generally, this is a safe procedure. However, problems may occur, including: Infection. Bleeding. Allergic reactions to medicines. A tear or hole (perforation) in the esophagus, stomach, or duodenum. What happens before the procedure? When to stop eating and drinking Follow instructions from your health care provider about what you may  eat and drink before your procedure. These may include: 8 hours before your procedure Stop eating most foods. Do not eat meat, fried foods, or fatty foods. Eat only light foods, such as toast or crackers. All liquids are okay except energy drinks and alcohol. 6 hours before your procedure Stop eating. Drink only clear liquids, such as water, clear fruit juice, black coffee, plain tea, and sports drinks. Do not drink energy drinks or alcohol. 2 hours before your procedure Stop drinking all liquids. You may be allowed to take medicines with small sips of water. If you do not follow your health care provider's instructions, your procedure may be delayed or canceled. Medicines Ask your health care provider about: Changing or stopping your regular medicines. This is especially important if you are taking diabetes medicines or blood thinners. Taking medicines such as aspirin and ibuprofen. These medicines can thin your blood. Do not take these medicines unless your health care provider tells you to take them. Taking over-the-counter medicines, vitamins, herbs, and supplements. General instructions If you will be going home right after the procedure, plan to have a responsible adult: Take you home from the hospital or clinic. You will not be allowed to drive. Care for you for the time you are told. What happens during the procedure?  An IV will be inserted into one of your veins. You may be given one or more of the following: A medicine to help you relax  (sedative). A medicine to numb the throat (local anesthetic). You will lie on your left side on an exam table. Your health care provider will pass the endoscope through your mouth and down your esophagus. Your health care provider will use the scope to check the inside of your esophagus, stomach, and duodenum. Biopsies may be taken. The endoscope will be removed. The procedure may vary among health care providers and hospitals. What can I expect after the procedure? After your procedure, it is common to have: A sore throat. Mild stomach pain or discomfort. Bloating. Nausea. When your throat is no longer numb, you may be given some fluids to drink. Your blood pressure, heart rate, breathing rate, and blood oxygen level will be monitored until you leave the hospital or clinic. It is up to you to get the results of your procedure. Ask your health care provider, or the department that is doing the procedure, when your results will be ready. Follow these instructions at home:  Follow instructions from your health care provider about eating or drinking restrictions. Take over-the-counter and prescription medicines only as told by your health care provider. Return to your normal activities as told by your health care provider. Ask your health care provider what activities are safe for you. If you were given a sedative during the procedure, it can affect you for several hours. Do not drive or operate machinery until your health care provider says that it is safe. Keep all follow-up visits. This is important. Contact a health care provider if: You have a sore throat that lasts longer than 1 day. You have trouble swallowing. Get help right away if: You vomit blood or your vomit looks like coffee grounds. You have a fever. You have bloody, black, or tarry stools. You have a severe sore throat or you cannot swallow. You have difficulty breathing or severe pain in your chest. These symptoms may be  an emergency. Get help right away. Call 911. Do not wait to see if the symptoms will go away. Do not  drive yourself to the hospital. Summary Upper endoscopy is a procedure to look inside the upper GI tract. During the procedure, an IV will be inserted into one of your veins. You may be given a medicine to help you relax. The endoscope will be passed through your mouth and down your esophagus. After the procedure, follow instructions from your health care provider about eating or drinking restrictions. This information is not intended to replace advice given to you by your health care provider. Make sure you discuss any questions you have with your health care provider. Document Revised: 07/12/2021 Document Reviewed: 07/12/2021 Elsevier Patient Education  [place] Endoscopy, Adult, Care After This sheet gives you information about how to care for yourself after your procedure. Your health care provider may also give you more specific instructions. If you have problems or questions, contact your health care provider. What can I expect after the procedure? After the procedure, it is common to have: A sore throat. Mild stomach pain or discomfort. Bloating. Nausea. Follow these instructions at home:  Follow instructions from your health care provider about what to eat or drink after your procedure. Return to your normal activities as told by your health care provider. Ask your health care provider what activities are safe for you. Take over-the-counter and prescription medicines only as told by your health care provider. If you were given a sedative during the procedure, it can affect you for several hours. Do not drive or operate machinery until your health care provider says that it is safe. Keep all follow-up visits as told by your health care provider. This is important. Contact a health care provider if you have: A sore throat that lasts longer than one day. Trouble  swallowing. Get help right away if: You vomit blood or your vomit looks like coffee grounds. You have: A fever. Bloody, black, or tarry stools. A severe sore throat or you cannot swallow. Difficulty breathing. Severe pain in your chest or abdomen. Summary After the procedure, it is common to have a sore throat, mild stomach discomfort, bloating, and nausea. If you were given a sedative during the procedure, it can affect you for several hours. Do not drive or operate machinery until your health care provider says that it is safe. Follow instructions from your health care provider about what to eat or drink after your procedure. Return to your normal activities as told by your health care provider. This information is not intended to replace advice given to you by your health care provider. Make sure you discuss any questions you have with your health care provider. Document Revised: 08/22/2019 Document Reviewed: 03/18/2018 Elsevier Patient Education  [place].

## 2022-04-03 ENCOUNTER — Encounter (HOSPITAL_COMMUNITY)
Admission: RE | Admit: 2022-04-03 | Discharge: 2022-04-03 | Disposition: A | Discharge status: Home / Self Care | Payer: Medicare Other | Source: Ambulatory Visit | Attending: Internal Medicine | Admitting: Internal Medicine

## 2022-04-06 ENCOUNTER — Encounter (HOSPITAL_COMMUNITY)
Admission: RE | Disposition: A | Discharge status: Home / Self Care | Payer: Self-pay | Source: Home / Self Care | Attending: Internal Medicine

## 2022-04-06 ENCOUNTER — Ambulatory Visit (HOSPITAL_BASED_OUTPATIENT_CLINIC_OR_DEPARTMENT_OTHER): Payer: Medicare Other | Admitting: Certified Registered Nurse Anesthetist

## 2022-04-06 ENCOUNTER — Ambulatory Visit (HOSPITAL_COMMUNITY)
Admission: RE | Admit: 2022-04-06 | Discharge: 2022-04-06 | Disposition: A | Discharge status: Home / Self Care | Payer: Medicare Other | Attending: Internal Medicine | Admitting: Internal Medicine

## 2022-04-06 ENCOUNTER — Encounter (HOSPITAL_COMMUNITY): Payer: Self-pay

## 2022-04-06 ENCOUNTER — Ambulatory Visit (HOSPITAL_COMMUNITY): Payer: Medicare Other | Admitting: Certified Registered Nurse Anesthetist

## 2022-04-06 DIAGNOSIS — D120 Benign neoplasm of cecum: Secondary | ICD-10-CM | POA: Diagnosis not present

## 2022-04-06 DIAGNOSIS — K219 Gastro-esophageal reflux disease without esophagitis: Secondary | ICD-10-CM | POA: Insufficient documentation

## 2022-04-06 DIAGNOSIS — K2970 Gastritis, unspecified, without bleeding: Secondary | ICD-10-CM

## 2022-04-06 DIAGNOSIS — Z1211 Encounter for screening for malignant neoplasm of colon: Secondary | ICD-10-CM | POA: Diagnosis present

## 2022-04-06 DIAGNOSIS — F17200 Nicotine dependence, unspecified, uncomplicated: Secondary | ICD-10-CM | POA: Diagnosis not present

## 2022-04-06 DIAGNOSIS — K648 Other hemorrhoids: Secondary | ICD-10-CM

## 2022-04-06 DIAGNOSIS — R634 Abnormal weight loss: Secondary | ICD-10-CM | POA: Insufficient documentation

## 2022-04-06 DIAGNOSIS — K5730 Diverticulosis of large intestine without perforation or abscess without bleeding: Secondary | ICD-10-CM | POA: Diagnosis not present

## 2022-04-06 DIAGNOSIS — K635 Polyp of colon: Secondary | ICD-10-CM | POA: Insufficient documentation

## 2022-04-06 DIAGNOSIS — K2950 Unspecified chronic gastritis without bleeding: Secondary | ICD-10-CM | POA: Diagnosis not present

## 2022-04-06 SURGERY — COLONOSCOPY WITH PROPOFOL
Anesthesia: General

## 2022-04-06 MED ORDER — OMEPRAZOLE 40 MG PO CPDR: 40.0000 mg | DELAYED_RELEASE_CAPSULE | Freq: Every day | ORAL | 11 refills | Status: DC

## 2022-04-06 MED ORDER — PHENYLEPHRINE HCL (PRESSORS) 10 MG/ML IV SOLN
INTRAVENOUS | Status: DC | PRN
  Administered 2022-04-06: 50 ug via INTRAVENOUS

## 2022-04-06 MED ORDER — PROPOFOL 500 MG/50ML IV EMUL
INTRAVENOUS | Status: AC
  Filled 2022-04-06: qty 50

## 2022-04-06 MED ORDER — LACTATED RINGERS IV SOLN: INTRAVENOUS | Status: DC

## 2022-04-06 MED ORDER — PROPOFOL 10 MG/ML IV BOLUS
INTRAVENOUS | Status: DC | PRN
  Administered 2022-04-06: 100 mg via INTRAVENOUS

## 2022-04-06 MED ORDER — LACTATED RINGERS IV SOLN: INTRAVENOUS | Status: DC | PRN

## 2022-04-06 MED ORDER — PROPOFOL 500 MG/50ML IV EMUL
INTRAVENOUS | Status: DC | PRN
  Administered 2022-04-06: 180 ug/kg/min via INTRAVENOUS

## 2022-04-06 NOTE — Interval H&P Note (Signed)
History and Physical Interval Note:  04/06/2022 10:27 AM  Nicholas Dominguez  has presented today for surgery, with the diagnosis of weight loss, abdominal pain, diarrhea.  The various methods of treatment have been discussed with the patient and family. After consideration of risks, benefits and other options for treatment, the patient has consented to  Procedure(s) with comments: COLONOSCOPY WITH PROPOFOL (N/A) - 11:00am ESOPHAGOGASTRODUODENOSCOPY (EGD) WITH PROPOFOL (N/A) as a surgical intervention.  The patient's history has been reviewed, patient examined, no change in status, stable for surgery.  I have reviewed the patient's chart and labs.  Questions were answered to the patient's satisfaction.     Eloise Harman

## 2022-04-06 NOTE — Op Note (Signed)
[facility] Patient Name: Nicholas Dominguez Procedure Date: 04/06/2022 10:47 AM MRN: [id number] Date of Birth: 22-May-1951 Attending MD: Elon Alas. Abbey Chatters DO CSN: [id number] Age: 71 Admit Type: Outpatient Procedure:                Colonoscopy Indications:              Screening for colorectal malignant neoplasm Providers:                Elon Alas. Abbey Chatters, DO, Charlsie Quest. Insurance claims handler, Therapist, sports,                            Suzan Garibaldi. Risa Grill, Technician Referring MD:              Medicines:                See the Anesthesia note for documentation of the                            administered medications Complications:            No immediate complications. Estimated Blood Loss:     Estimated blood loss was minimal. Procedure:                Pre-Anesthesia Assessment:                           - The anesthesia plan was to use monitored                            anesthesia care (MAC).                           After obtaining informed consent, the colonoscope                            was passed under direct vision. Throughout the                            procedure, the patient's blood pressure, pulse, and                            oxygen saturations were monitored continuously. The                            PCF-HQ190L ([id number]) was introduced through the                            anus and advanced to the the cecum, identified by                            appendiceal orifice and ileocecal valve. The                            colonoscopy was performed without difficulty. The                            patient tolerated the  procedure well. The quality                            of the bowel preparation was evaluated using the                            BBPS [facility] Bowel Preparation Scale) with scores                            of: Right Colon = 3, Transverse Colon = 3 and Left                            Colon = 3 (entire mucosa seen well with no residual                             staining, small fragments of stool or opaque                            liquid). The total BBPS score equals 9. Scope In: 10:49:02 AM Scope Out: 11:04:50 AM Scope Withdrawal Time: 0 hours 12 minutes 52 seconds  Total Procedure Duration: 0 hours 15 minutes 48 seconds  Findings:      The perianal and digital rectal examinations were normal.      Non-bleeding internal hemorrhoids were found during endoscopy.      Multiple medium-mouthed diverticula were found in the sigmoid colon.      A 5 mm polyp was found in the sigmoid colon. The polyp was sessile. The       polyp was removed with a cold snare. Resection and retrieval were       complete.      A 7 mm polyp was found in the cecum. The polyp was flat. The polyp was       removed with a cold snare. Resection and retrieval were complete.      The terminal ileum appeared normal. Impression:               - Non-bleeding internal hemorrhoids.                           - Diverticulosis in the sigmoid colon.                           - One 5 mm polyp in the sigmoid colon, removed with                            a cold snare. Resected and retrieved.                           - One 7 mm polyp in the cecum, removed with a cold                            snare. Resected and retrieved.                           - The examined portion of the ileum was  normal. Moderate Sedation:      Per Anesthesia Care Recommendation:           - Patient has a contact number available for                            emergencies. The signs and symptoms of potential                            delayed complications were discussed with the                            patient. Return to normal activities tomorrow.                            Written discharge instructions were provided to the                            patient.                           - Resume previous diet.                           - Continue present medications.                           - Await  pathology results.                           - Repeat colonoscopy in 5 years for surveillance. Procedure Code(s):        --- Professional ---                           [phone], Colonoscopy, flexible; with removal of                            tumor(s), polyp(s), or other lesion(s) by snare                            technique Diagnosis Code(s):        --- Professional ---                           Z12.11, Encounter for screening for malignant                            neoplasm of colon                           K64.8, Other hemorrhoids                           K63.5, Polyp of colon                           K57.30, Diverticulosis of large intestine without  perforation or abscess without bleeding CPT copyright 2019 American [employer]. All rights reserved. The codes documented in this report are preliminary and upon coder review may  be revised to meet current compliance requirements. Elon Alas. Abbey Chatters, DO [place] Abbey Chatters, DO 04/06/2022 11:08:42 AM This report has been signed electronically. Number of Addenda: 0

## 2022-04-06 NOTE — Anesthesia Preprocedure Evaluation (Signed)
Anesthesia Evaluation  Patient identified by MRN, date of birth, ID band Patient awake    Reviewed: Allergy & Precautions, H&P , NPO status , Patient's Chart, lab work & pertinent test results, reviewed documented beta blocker date and time   Airway Mallampati: II  TM Distance: >3 FB Neck ROM: full    Dental no notable dental hx.    Pulmonary neg pulmonary ROS, Current Smoker and Patient abstained from smoking.,    Pulmonary exam normal breath sounds clear to auscultation       Cardiovascular Exercise Tolerance: Good negative cardio ROS   Rhythm:regular Rate:Normal     Neuro/Psych negative neurological ROS  negative psych ROS   GI/Hepatic Neg liver ROS, GERD  Medicated,  Endo/Other  negative endocrine ROS  Renal/GU negative Renal ROS  negative genitourinary   Musculoskeletal   Abdominal   Peds  Hematology negative hematology ROS (+)   Anesthesia Other Findings   Reproductive/Obstetrics negative OB ROS                             Anesthesia Physical Anesthesia Plan  ASA: 2  Anesthesia Plan: General   Post-op Pain Management:    Induction:   PONV Risk Score and Plan: Propofol infusion  Airway Management Planned:   Additional Equipment:   Intra-op Plan:   Post-operative Plan:   Informed Consent: I have reviewed the patients History and Physical, chart, labs and discussed the procedure including the risks, benefits and alternatives for the proposed anesthesia with the patient or authorized representative who has indicated his/her understanding and acceptance.     Dental Advisory Given  Plan Discussed with: CRNA  Anesthesia Plan Comments:         Anesthesia Quick Evaluation

## 2022-04-06 NOTE — Op Note (Signed)
[facility] Patient Name: Nicholas Dominguez Procedure Date: 04/06/2022 10:26 AM MRN: [id number] Date of Birth: 10/02/1951 Attending MD: Elon Alas. Edgar Frisk CSN: [id number] Age: 71 Admit Type: Outpatient Procedure:                Upper GI endoscopy Indications:              Epigastric abdominal pain, Weight loss Providers:                Elon Alas. Abbey Chatters, DO, Charlsie Quest. Insurance claims handler, Therapist, sports,                            Suzan Garibaldi. Risa Grill, Technician Referring MD:              Medicines:                See the Anesthesia note for documentation of the                            administered medications Complications:            No immediate complications. Estimated Blood Loss:     Estimated blood loss was minimal. Procedure:                Pre-Anesthesia Assessment:                           - The anesthesia plan was to use monitored                            anesthesia care (MAC).                           After obtaining informed consent, the endoscope was                            passed under direct vision. Throughout the                            procedure, the patient's blood pressure, pulse, and                            oxygen saturations were monitored continuously. The                            [id number]-H190 ([id number]) scope was introduced through the                            mouth, and advanced to the second part of duodenum.                            The upper GI endoscopy was accomplished without                            difficulty. The patient tolerated the procedure  well. Scope In: 10:42:01 AM Scope Out: 10:44:56 AM Total Procedure Duration: 0 hours 2 minutes 55 seconds  Findings:      There is no endoscopic evidence of bleeding, areas of erosion,       esophagitis, ulcerations or varices in the entire esophagus.      Patchy mild inflammation characterized by erythema was found in the       gastric body. Biopsies were taken with a cold  forceps for Helicobacter       pylori testing.      The duodenal bulb, first portion of the duodenum and second portion of       the duodenum were normal. Impression:               - Gastritis. Biopsied.                           - Normal duodenal bulb, first portion of the                            duodenum and second portion of the duodenum. Moderate Sedation:      Per Anesthesia Care Recommendation:           - Patient has a contact number available for                            emergencies. The signs and symptoms of potential                            delayed complications were discussed with the                            patient. Return to normal activities tomorrow.                            Written discharge instructions were provided to the                            patient.                           - Resume previous diet.                           - Continue present medications.                           - Await pathology results.                           - Use Prilosec (omeprazole) 40 mg PO daily. Procedure Code(s):        --- Professional ---                           [id number], Esophagogastroduodenoscopy, flexible,                            transoral; with biopsy, single or multiple Diagnosis Code(s):        ---  Professional ---                           K29.70, Gastritis, unspecified, without bleeding                           R10.13, Epigastric pain                           R63.4, Abnormal weight loss CPT copyright 2019 American Medical Association. All rights reserved. The codes documented in this report are preliminary and upon coder review may  be revised to meet current compliance requirements. Elon Alas. Abbey Chatters, DO [place] Abbey Chatters, DO 04/06/2022 10:47:52 AM This report has been signed electronically. Number of Addenda: 0

## 2022-04-06 NOTE — Anesthesia Postprocedure Evaluation (Signed)
Anesthesia Post Note  Patient: Nicholas Dominguez  Procedure(s) Performed: COLONOSCOPY WITH PROPOFOL ESOPHAGOGASTRODUODENOSCOPY (EGD) WITH PROPOFOL BIOPSY POLYPECTOMY  Patient location during evaluation: Phase II Anesthesia Type: General Level of consciousness: awake Pain management: pain level controlled Vital Signs Assessment: post-procedure vital signs reviewed and stable Respiratory status: spontaneous breathing and respiratory function stable Cardiovascular status: blood pressure returned to baseline and stable Postop Assessment: no headache and no apparent nausea or vomiting Anesthetic complications: no Comments: Late entry   No notable events documented.   Last Vitals:  Vitals:   04/06/22 0946 04/06/22 1110  BP: 137/70 99/62  Pulse: 68 75  Resp: 12 (!) 97  Temp: (!) 36.4 C (!) 36.3 C  SpO2: 97% 100%    Last Pain:  Vitals:   04/06/22 1110  TempSrc: Axillary  PainSc: 0-No pain                 Louann Sjogren

## 2022-04-06 NOTE — Transfer of Care (Signed)
Immediate Anesthesia Transfer of Care Note  Patient: Nicholas Dominguez  Procedure(s) Performed: COLONOSCOPY WITH PROPOFOL ESOPHAGOGASTRODUODENOSCOPY (EGD) WITH PROPOFOL BIOPSY POLYPECTOMY  Patient Location: PACU  Anesthesia Type:General  Level of Consciousness: awake, alert  and oriented  Airway & Oxygen Therapy: Patient Spontanous Breathing  Post-op Assessment: Report given to RN, Post -op Vital signs reviewed and stable, Patient moving all extremities X 4 and Patient able to stick tongue midline  Post vital signs: Reviewed  Last Vitals:  Vitals Value Taken Time  BP 93/54   Temp 97.4   Pulse 75   Resp 11   SpO2 100     Last Pain:  Vitals:   04/06/22 1041  TempSrc:   PainSc: 0-No pain      Patients Stated Pain Goal: 8 (66/81/59 [id number])  Complications: No notable events documented.

## 2022-04-06 NOTE — Discharge Instructions (Addendum)
EGD Discharge instructions Please read the instructions outlined below and refer to this sheet in the next few weeks. These discharge instructions provide you with general information on caring for yourself after you leave the hospital. Your doctor may also give you specific instructions. While your treatment has been planned according to the most current medical practices available, unavoidable complications occasionally occur. If you have any problems or questions after discharge, please call your doctor. ACTIVITY You may resume your regular activity but move at a slower pace for the next 24 hours.  Take frequent rest periods for the next 24 hours.  Walking will help expel (get rid of) the air and reduce the bloated feeling in your abdomen.  No driving for 24 hours (because of the anesthesia (medicine) used during the test).  You may shower.  Do not sign any important legal documents or operate any machinery for 24 hours (because of the anesthesia used during the test).  NUTRITION Drink plenty of fluids.  You may resume your normal diet.  Begin with a light meal and progress to your normal diet.  Avoid alcoholic beverages for 24 hours or as instructed by your caregiver.  MEDICATIONS You may resume your normal medications unless your caregiver tells you otherwise.  WHAT YOU CAN EXPECT TODAY You may experience abdominal discomfort such as a feeling of fullness or "gas" pains.  FOLLOW-UP Your doctor will discuss the results of your test with you.  SEEK IMMEDIATE MEDICAL ATTENTION IF ANY OF THE FOLLOWING OCCUR: Excessive nausea (feeling sick to your stomach) and/or vomiting.  Severe abdominal pain and distention (swelling).  Trouble swallowing.  Temperature over 101 F (37.8 C).  Rectal bleeding or vomiting of blood.     Colonoscopy Discharge Instructions  Read the instructions outlined below and refer to this sheet in the next few weeks. These discharge instructions provide you with  general information on caring for yourself after you leave the hospital. Your doctor may also give you specific instructions. While your treatment has been planned according to the most current medical practices available, unavoidable complications occasionally occur.   ACTIVITY You may resume your regular activity, but move at a slower pace for the next 24 hours.  Take frequent rest periods for the next 24 hours.  Walking will help get rid of the air and reduce the bloated feeling in your belly (abdomen).  No driving for 24 hours (because of the medicine (anesthesia) used during the test).   Do not sign any important legal documents or operate any machinery for 24 hours (because of the anesthesia used during the test).  NUTRITION Drink plenty of fluids.  You may resume your normal diet as instructed by your doctor.  Begin with a light meal and progress to your normal diet. Heavy or fried foods are harder to digest and may make you feel sick to your stomach (nauseated).  Avoid alcoholic beverages for 24 hours or as instructed.  MEDICATIONS You may resume your normal medications unless your doctor tells you otherwise.  WHAT YOU CAN EXPECT TODAY Some feelings of bloating in the abdomen.  Passage of more gas than usual.  Spotting of blood in your stool or on the toilet paper.  IF YOU HAD POLYPS REMOVED DURING THE COLONOSCOPY: No aspirin products for 7 days or as instructed.  No alcohol for 7 days or as instructed.  Eat a soft diet for the next 24 hours.  FINDING OUT THE RESULTS OF YOUR TEST Not all test results are  available during your visit. If your test results are not back during the visit, make an appointment with your caregiver to find out the results. Do not assume everything is normal if you have not heard from your caregiver or the medical facility. It is important for you to follow up on all of your test results.  SEEK IMMEDIATE MEDICAL ATTENTION IF: You have more than a spotting of  blood in your stool.  Your belly is swollen (abdominal distention).  You are nauseated or vomiting.  You have a temperature over 101.  You have abdominal pain or discomfort that is severe or gets worse throughout the day.   Your EGD revealed mild amount inflammation in your stomach.  I took biopsies of this to rule out infection with a bacteria called H. pylori.  Await pathology results, my office will contact you. I am going to increase your Omeprazole to 40 mg daily.   Your colonoscopy revealed 2 polyp(s) which I removed successfully. Await pathology results, my office will contact you. I recommend repeating colonoscopy in 5 years for surveillance purposes.   You also have diverticulosis and internal hemorrhoids. I would recommend increasing fiber in your diet or adding OTC Benefiber/Metamucil. Be sure to drink at least 4 to 6 glasses of water daily. Follow-up with GI as needed.    I hope you have a great rest of your week!  Elon Alas. Abbey Chatters, D.O. Gastroenterology and Hepatology [facility]

## 2022-04-07 LAB — SURGICAL PATHOLOGY

## 2022-04-20 ENCOUNTER — Encounter (HOSPITAL_COMMUNITY): Payer: Self-pay | Admitting: Internal Medicine

## 2022-07-19 ENCOUNTER — Ambulatory Visit (INDEPENDENT_AMBULATORY_CARE_PROVIDER_SITE_OTHER): Payer: Medicare Other | Admitting: Internal Medicine

## 2022-07-19 ENCOUNTER — Encounter: Payer: Self-pay | Admitting: Internal Medicine

## 2022-07-19 VITALS — BP 127/74 | HR 59 | Temp 98.2°F | Ht 71.0 in | Wt 180.0 lb

## 2022-07-19 DIAGNOSIS — K581 Irritable bowel syndrome with constipation: Secondary | ICD-10-CM

## 2022-07-19 DIAGNOSIS — K219 Gastro-esophageal reflux disease without esophagitis: Secondary | ICD-10-CM | POA: Diagnosis not present

## 2022-07-19 DIAGNOSIS — D122 Benign neoplasm of ascending colon: Secondary | ICD-10-CM

## 2022-07-19 DIAGNOSIS — R1033 Periumbilical pain: Secondary | ICD-10-CM | POA: Diagnosis not present

## 2022-07-19 NOTE — Patient Instructions (Signed)
For your chronic constipation and abdominal pain, I am going to give you samples of medication called Linzess 145 mcg daily.  Take this once in the morning.  You may have an initial washout period with this medication.  This should improve over 2 to 3 days.  Let us know in a week how you are doing.  If symptoms are improved then I will send in formal prescription.  We do have room to both increase or decrease the dose if needed.  I want you to also start taking over-the-counter Benefiber.  Be sure to drink at least 6 glasses of water daily.  Follow-up in 2 months.  It was very nice seeing both you again today.  Dr. Abbey Chatters

## 2022-07-19 NOTE — Progress Notes (Signed)
Referring Provider: No ref. provider found Primary Care Physician:  Pcp, No Primary GI:  Dr. Abbey Chatters  Chief Complaint  Patient presents with   Abdominal Pain    Follow up on GERD and constipation. Having a cramping pain in mid abdomen. Sometimes so bad he doubles over in pain. Has BM about every 2 -4 days. Concerns about weight loss. Has lost 13 lbs since last visit in May. Does not have an appetite.     HPI:   Nicholas Dominguez is a 71 y.o. male who presents to clinic today for follow-up visit.  History of chronic GERD well-controlled on omeprazole.  Upper endoscopy 04/06/2022 with gastritis, negative for H. pylori.  Colonoscopy 04/06/2022 with 2 polyps removed, 1 adenomatous colon polyp, 5-year recall.  Main complaint for me is ongoing abdominal pain, periumbilical, epigastric, mild to moderate severity.  Intermittent in nature.  Also with weight loss.  Has lost 13 pounds since visit in May.  Notes poor appetite.  CT abdomen pelvis with contrast 03/22/2022 unremarkable from GI standpoint.  Does note constipation having bowel movements once every 2 to 4 days.  Sometimes straining.  No melena hematochezia.  Past Medical History:  Diagnosis Date   Tobacco use     Past Surgical History:  Procedure Laterality Date   BIOPSY  04/06/2022   Procedure: BIOPSY;  Surgeon: Eloise Harman, DO;  [facility]: [facility];  Service: Endoscopy;;   COLONOSCOPY WITH PROPOFOL N/A 04/06/2022   Procedure: COLONOSCOPY WITH PROPOFOL;  Surgeon: Eloise Harman, DO;  Location: [facility];  Service: Endoscopy;  Laterality: N/A;  11:00am   ESOPHAGOGASTRODUODENOSCOPY (EGD) WITH PROPOFOL N/A 04/06/2022   Procedure: ESOPHAGOGASTRODUODENOSCOPY (EGD) WITH PROPOFOL;  Surgeon: Eloise Harman, DO;  [facility]: [facility];  Service: Endoscopy;  Laterality: N/A;   POLYPECTOMY  04/06/2022   Procedure: POLYPECTOMY;  Surgeon: Eloise Harman, DO;  [facility]: [facility];  Service: Endoscopy;;   TONSILLECTOMY      VASECTOMY      Current Outpatient Medications  Medication Sig Dispense Refill   aspirin 81 MG EC tablet Take 81 mg by mouth daily.     Multiple Vitamin (MULTI VITAMIN) TABS Take 1 tablet by mouth daily.     niacin 500 MG tablet Take 500 mg by mouth daily.     Omega-3 Fatty Acids (FISH OIL) 1000 MG CAPS Take 1,000 mg by mouth daily.     omeprazole (PRILOSEC) 40 MG capsule Take 1 capsule (40 mg total) by mouth daily. 30 capsule 11   No current facility-administered medications for this visit.    Allergies as of 07/19/2022   (No Known Allergies)    Family History  Problem Relation Age of Onset   Heart disease Mother    Prostate cancer Father    Stroke Father     Social History   Socioeconomic History   Marital status: Married    Spouse name: Not on file   Number of children: Not on file   Years of education: Not on file   Highest education level: Not on file  Occupational History   Not on file  Tobacco Use   Smoking status: Some Days    Packs/day: 0.25    Types: Cigarettes    Passive exposure: Current   Smokeless tobacco: Never  Vaping Use   Vaping Use: Never used  Substance and Sexual Activity   Alcohol use: Not Currently   Drug use: Not Currently   Sexual activity: Yes  Other Topics Concern   Not on file  Social History Narrative   Not on file   Social Determinants of Health   Financial Resource Strain: Not on file  Food Insecurity: Not on file  Transportation Needs: Not on file  Physical Activity: Not on file  Stress: Not on file  Social Connections: Not on file    Subjective: Review of Systems  Constitutional:  Negative for chills and fever.  HENT:  Negative for congestion and hearing loss.   Eyes:  Negative for blurred vision and double vision.  Respiratory:  Negative for cough and shortness of breath.   Cardiovascular:  Negative for chest pain and palpitations.  Gastrointestinal:  Positive for abdominal pain, constipation and heartburn. Negative  for blood in stool, diarrhea, melena and vomiting.  Genitourinary:  Negative for dysuria and urgency.  Musculoskeletal:  Negative for joint pain and myalgias.  Skin:  Negative for itching and rash.  Neurological:  Negative for dizziness and headaches.  Psychiatric/Behavioral:  Negative for depression. The patient is not nervous/anxious.      Objective: BP 127/74 (BP Location: Left Arm, Patient Position: Sitting, Cuff Size: Large)   Pulse (!) 59   Temp 98.2 F (36.8 C) (Oral)   Ht '5\' 11"'$  (1.803 m)   Wt 180 lb (81.6 kg)   BMI 25.10 kg/m  Physical Exam Constitutional:      Appearance: Normal appearance.  HENT:     Head: Normocephalic and atraumatic.  Eyes:     Extraocular Movements: Extraocular movements intact.     Conjunctiva/sclera: Conjunctivae normal.  Cardiovascular:     Rate and Rhythm: Normal rate and regular rhythm.  Pulmonary:     Effort: Pulmonary effort is normal.     Breath sounds: Normal breath sounds.  Abdominal:     General: Bowel sounds are normal.     Palpations: Abdomen is soft.  Musculoskeletal:        General: Normal range of motion.     Cervical back: Normal range of motion and neck supple.  Skin:    General: Skin is warm.  Neurological:     General: No focal deficit present.     Mental Status: He is alert and oriented to person, place, and time.  Psychiatric:        Mood and Affect: Mood normal.        Behavior: Behavior normal.      Assessment: *Chronic GERD-well-controlled on omeprazole *Abdominal pain *Constipation *Adenomatous colon polyp  Plan: Chronic GERD well-controlled on omeprazole.  We will continue.  In regards to his abdominal pain chronic constipation, likely has component of IBS.  Given samples of Linzess 145 mcg daily.  Counseled on initial washout period.  Patient to call office next week with update.  We will send in formal prescription if improved.  Recommend start taking Benefiber over-the-counter daily.  Counseled on  drinking at least 6 glasses of water daily.  Colonoscopy recall 2028.  Follow-up in 2 to 3 months  07/19/2022 10:18 AM   Disclaimer: This note was dictated with voice recognition software. Similar sounding words can inadvertently be transcribed and may not be corrected upon review.

## 2022-07-26 ENCOUNTER — Telehealth: Payer: Self-pay

## 2022-07-26 NOTE — Telephone Encounter (Signed)
Pt's wife called to advise that the samples of Linzess 145 mcg you gave to the pt really helped and they would like a formal Rx sent in to their pharmacy.

## 2022-07-27 ENCOUNTER — Other Ambulatory Visit: Payer: Self-pay | Admitting: Internal Medicine

## 2022-07-27 MED ORDER — LINACLOTIDE 145 MCG PO CAPS: 145.0000 ug | ORAL_CAPSULE | Freq: Every day | ORAL | 3 refills | Status: DC

## 2022-07-27 NOTE — Telephone Encounter (Signed)
Sent to pharmacy. Thanks

## 2022-07-27 NOTE — Telephone Encounter (Signed)
Phoned and advised the pt's wife that his Rx for Linzess 145 mcg was sent to his pharmacy.

## 2022-07-31 NOTE — Telephone Encounter (Signed)
Pt's wife phoned back and said pt's Rx for Linzess 145 mcg was $1000.00 and they could not afford it. I advised her that he does not have a Rx card listed with [place] and if he does have one they need to give me the information. Which she need. I will try a PA for the pt. Medicare Rx [place], ID# [id number], RXBIN [id number], TGGYI -[id number], RXGRP# [id number].

## 2022-07-31 NOTE — Telephone Encounter (Signed)
Pt's wife called and stated Pt's Linzess was $1000.00. I looked up his insurance. [employer] is recommending Movantik or Lubiprostone. Please change for the pt and I will advise the pt once this is done.

## 2022-08-02 ENCOUNTER — Other Ambulatory Visit: Payer: Self-pay | Admitting: Internal Medicine

## 2022-08-02 MED ORDER — LUBIPROSTONE 24 MCG PO CAPS: 24.0000 ug | ORAL_CAPSULE | Freq: Two times a day (BID) | ORAL | 5 refills | Status: DC

## 2022-08-02 NOTE — Telephone Encounter (Signed)
Lubiprostone 24 mg Bid sent to pharmacy, thanks

## 2022-08-03 NOTE — Telephone Encounter (Signed)
Spoke to pt's wife and advised of the Rx. Advised if it is still too high have pharmacy to send to [place] for a PA and she expressed understanding.

## 2022-08-29 ENCOUNTER — Emergency Department (HOSPITAL_COMMUNITY)
Admission: EM | Admit: 2022-08-29 | Discharge: 2022-08-29 | Disposition: A | Discharge status: Home / Self Care | Payer: Medicare Other | Attending: Emergency Medicine | Admitting: Emergency Medicine

## 2022-08-29 ENCOUNTER — Emergency Department (HOSPITAL_COMMUNITY): Payer: Medicare Other

## 2022-08-29 ENCOUNTER — Encounter (HOSPITAL_COMMUNITY): Payer: Self-pay | Admitting: Emergency Medicine

## 2022-08-29 ENCOUNTER — Other Ambulatory Visit: Payer: Self-pay

## 2022-08-29 DIAGNOSIS — Z7982 Long term (current) use of aspirin: Secondary | ICD-10-CM | POA: Diagnosis not present

## 2022-08-29 DIAGNOSIS — R1032 Left lower quadrant pain: Secondary | ICD-10-CM | POA: Insufficient documentation

## 2022-08-29 DIAGNOSIS — R1030 Lower abdominal pain, unspecified: Secondary | ICD-10-CM

## 2022-08-29 LAB — COMPREHENSIVE METABOLIC PANEL
ALT: 11 U/L (ref 0–44)
AST: 20 U/L (ref 15–41)
Albumin: 3.7 g/dL (ref 3.5–5.0)
Alkaline Phosphatase: 103 U/L (ref 38–126)
Anion gap: 10 (ref 5–15)
BUN: 16 mg/dL (ref 8–23)
CO2: 25 mmol/L (ref 22–32)
Calcium: 9.3 mg/dL (ref 8.9–10.3)
Chloride: 98 mmol/L (ref 98–111)
Creatinine, Ser: 0.79 mg/dL (ref 0.61–1.24)
GFR, Estimated: >60 mL/min (ref >60)
Glucose, Bld: 110 mg/dL — ABNORMAL HIGH (ref 70–99)
Potassium: 4.8 mmol/L (ref 3.5–5.1)
Sodium: 133 mmol/L — ABNORMAL LOW (ref 135–145)
Total Bilirubin: 1.5 mg/dL — ABNORMAL HIGH (ref 0.3–1.2)
Total Protein: 7 g/dL (ref 6.5–8.1)

## 2022-08-29 LAB — CBC
HCT: 39.1 % (ref 39.0–52.0)
Hemoglobin: 13.2 g/dL (ref 13.0–17.0)
MCH: 31.6 pg (ref 26.0–34.0)
MCHC: 33.8 g/dL (ref 30.0–36.0)
MCV: 93.5 fL (ref 80.0–100.0)
Platelets: 355 10*3/uL (ref 150–400)
RBC: 4.18 MIL/uL — ABNORMAL LOW (ref 4.22–5.81)
RDW: 12.8 % (ref 11.5–15.5)
WBC: 12.6 10*3/uL — ABNORMAL HIGH (ref 4.0–10.5)
nRBC: 0 % (ref 0.0–0.2)

## 2022-08-29 LAB — LIPASE, BLOOD: Lipase: 34 U/L (ref 11–51)

## 2022-08-29 MED ORDER — OXYCODONE-ACETAMINOPHEN 5-325 MG PO TABS: 1.0000 | ORAL_TABLET | Freq: Four times a day (QID) | ORAL | 0 refills | Status: DC | PRN

## 2022-08-29 MED ORDER — HYDROMORPHONE HCL 1 MG/ML IJ SOLN
0.5000 mg | Freq: Once | INTRAMUSCULAR | Status: AC
  Administered 2022-08-29: 0.5 mg via INTRAVENOUS
  Filled 2022-08-29: qty 0.5

## 2022-08-29 MED ORDER — SODIUM CHLORIDE 0.9 % IV BOLUS
1000.0000 mL | Freq: Once | INTRAVENOUS | Status: AC
  Administered 2022-08-29: 1000 mL via INTRAVENOUS

## 2022-08-29 MED ORDER — IOHEXOL 300 MG/ML  SOLN
100.0000 mL | Freq: Once | INTRAMUSCULAR | Status: AC | PRN
  Administered 2022-08-29: 100 mL via INTRAVENOUS

## 2022-08-29 MED ORDER — ONDANSETRON HCL 4 MG/2ML IJ SOLN
4.0000 mg | Freq: Once | INTRAMUSCULAR | Status: AC
  Administered 2022-08-29: 4 mg via INTRAVENOUS
  Filled 2022-08-29: qty 2

## 2022-08-29 MED ORDER — ONDANSETRON 4 MG PO TBDP: ORAL_TABLET | ORAL | 0 refills | Status: DC

## 2022-08-29 NOTE — ED Triage Notes (Signed)
Pt arrives POV c/o abd pain for "awhile". Pt has appt with gastro on Thursday but wasn't able to wait. Pt c/o generalized abd pain with nausea.

## 2022-08-29 NOTE — ED Provider Notes (Signed)
[facility] EMERGENCY DEPARTMENT Provider Note   CSN: [id number] Arrival date & time: 08/29/22  1836     History {Add pertinent medical, surgical, social history, OB history to HPI:1} Chief Complaint  Patient presents with   Abdominal Pain    Nicholas Dominguez is a 71 y.o. male.  Patient complains of left lower quad abdominal pain and some loose stools.  Patient states he has had pain for a number of weeks and has lost 50 pounds.  He had endoscopy done upper and lower by Dr. Abbey Chatters   Abdominal Pain      Home Medications Prior to Admission medications   Medication Sig Start Date End Date Taking? Authorizing Provider  ondansetron (ZOFRAN-ODT) 4 MG disintegrating tablet '4mg'$  ODT q4 hours prn nausea/vomit 08/29/22  Yes Milton Ferguson, MD  oxyCODONE-acetaminophen (PERCOCET/ROXICET) 5-325 MG tablet Take 1 tablet by mouth every 6 (six) hours as needed for severe pain. 08/29/22  Yes Milton Ferguson, MD  oxyCODONE-acetaminophen (PERCOCET/ROXICET) 5-325 MG tablet Take 1 tablet by mouth every 6 (six) hours as needed for severe pain. 08/29/22  Yes Milton Ferguson, MD  aspirin 81 MG EC tablet Take 81 mg by mouth daily.    [provider]  lubiprostone (AMITIZA) 24 MCG capsule Take 1 capsule (24 mcg total) by mouth 2 (two) times daily with a meal. 08/02/22 01/29/23  Eloise Harman, DO  Multiple Vitamin (MULTI VITAMIN) TABS Take 1 tablet by mouth daily.    [provider]  niacin 500 MG tablet Take 500 mg by mouth daily.    [provider]  Omega-3 Fatty Acids (FISH OIL) 1000 MG CAPS Take 1,000 mg by mouth daily.    [provider]  omeprazole (PRILOSEC) 40 MG capsule Take 1 capsule (40 mg total) by mouth daily. 04/06/22 04/06/23  Eloise Harman, DO      Allergies    Patient has no known allergies.    Review of Systems   Review of Systems  Gastrointestinal:  Positive for abdominal pain.    Physical Exam Updated Vital Signs BP (!) 143/78   Pulse 79    Temp 98.2 F (36.8 C) (Oral)   Resp 18   Ht '5\' 11"'$  (1.803 m)   Wt 77.7 kg   SpO2 97%   BMI 23.89 kg/m  Physical Exam  ED Results / Procedures / Treatments   Labs (all labs ordered are listed, but only abnormal results are displayed) Labs Reviewed  COMPREHENSIVE METABOLIC PANEL - Abnormal; Notable for the following components:      Result Value   Sodium 133 (*)    Glucose, Bld 110 (*)    Total Bilirubin 1.5 (*)    All other components within normal limits  CBC - Abnormal; Notable for the following components:   WBC 12.6 (*)    RBC 4.18 (*)    All other components within normal limits  LIPASE, BLOOD    EKG None  Radiology CT ABDOMEN PELVIS W CONTRAST  Result Date: 08/29/2022 CLINICAL DATA:  Acute abdominal pain.  Nausea.  Constipation. EXAM: CT ABDOMEN AND PELVIS WITH CONTRAST TECHNIQUE: Multidetector CT imaging of the abdomen and pelvis was performed using the standard protocol following bolus administration of intravenous contrast. RADIATION DOSE REDUCTION: This exam was performed according to the departmental dose-optimization program which includes automated exposure control, adjustment of the mA and/or kV according to patient size and/or use of iterative reconstruction technique. CONTRAST:  133m OMNIPAQUE IOHEXOL 300 MG/ML  SOLN COMPARISON:  CT 03/22/2022  FINDINGS: Lower chest: There are clustered tree-in-bud opacities in the right lower lobe series 4, images 1 and 2. Slight tree-in-bud opacities in the right middle lobe series 4, image 12. Right lower lobe bronchial thickening with mucoid impaction. Linear atelectasis in the right and left lower lobes. No pleural effusion. Hepatobiliary: No focal liver abnormality is seen. No gallstones, gallbladder wall thickening, or biliary dilatation. Pancreas: No ductal dilatation or inflammation. Spleen: Choose Adrenals/Urinary Tract: No adrenal nodule. No hydronephrosis. No significant perinephric edema. There are 2 cysts in the right  kidney, largest cyst is a thin calcified septation. No solid lesion. Partially distended urinary bladder, no bladder wall thickening. Stomach/Bowel: Stomach is only minimally distended. Normal positioning of the duodenum. Small bowel is diffusely filled and edematous primarily in the left and central abdomen. Marked small bowel wall thickening involving left abdominal bowel loops with adjacent mesenteric edema and inflammation. There is no bowel pneumatosis. The distal ileal bowel loops are nondistended, however there is no discrete transition point. No definite swirling of mesentery. Normal appendix is tentatively visualized. Small volume of formed stool in the colon. Left colon is nondistended. Minimal sigmoid diverticulosis without focal diverticulitis. Vascular/Lymphatic: Moderate aortic and branch atherosclerosis. The portal and central mesenteric veins are patent. There is central mesenteric adenopathy that is larger than typically seen with reactive lymph nodes. For example 16 mm low-density central mesenteric node series 2, image 45. Additional prominent and enlarged mesenteric nodes. There are no definite enlarged retroperitoneal or pelvic lymph nodes. Reproductive: Prominent prostate with coarse calcification. Other: Mesenteric edema with small amount of mesenteric free fluid. There is free fluid within the right upper quadrant and right pelvis. Small amount of free fluid in the left pericolic gutter. No free air. No focal fluid collection. No abdominal wall hernia. Musculoskeletal: Degenerative disc disease and facet hypertrophy most prominent at L5-S1. There are no acute or suspicious osseous abnormalities. IMPRESSION: 1. Marked fluid-filled small bowel with wall thickening involving left abdominal bowel loops with adjacent mesenteric edema and inflammation. Findings are most consistent with enteritis, possibly infectious or inflammatory in etiology. There is no discrete transition point to suggest  obstruction. 2. Central mesenteric adenopathy that is larger than typically seen with reactive lymph nodes. This recommend follow-up CT after resolution of acute event to exclude the possibility of lymphoproliferative disorder. 3. Clustered tree-in-bud opacities in the right lower lobe and right middle lobe of the included lung bases, likely infectious or inflammatory. Right lower lobe bronchial thickening with mucoid impaction. 4. Minimal sigmoid diverticulosis without focal diverticulitis. Aortic Atherosclerosis (ICD10-I70.0). Electronically Signed   By: Keith Rake M.D.   On: 08/29/2022 21:08    Procedures Procedures  {Document cardiac monitor, telemetry assessment procedure when appropriate:1}  Medications Ordered in ED Medications  sodium chloride 0.9 % bolus 1,000 mL (1,000 mLs Intravenous New Bag/Given 08/29/22 1953)  HYDROmorphone (DILAUDID) injection 0.5 mg (0.5 mg Intravenous Given 08/29/22 1951)  ondansetron (ZOFRAN) injection 4 mg (4 mg Intravenous Given 08/29/22 1952)  iohexol (OMNIPAQUE) 300 MG/ML solution 100 mL (100 mLs Intravenous Contrast Given 08/29/22 2051)    ED Course/ Medical Decision Making/ A&P  Patient with abdominal I spoke with Dr. Abbey Chatters and he felt like was reasonable to send the patient home for follow-up on Thursday                         Medical Decision Making Amount and/or Complexity of Data Reviewed Labs: ordered. Radiology: ordered.  Risk Prescription drug management.  Abdominal pain and enteritis.  Patient given a prepack of Percocet he will follow-up with GI.  He also was given some Zofran  {Document critical care time when appropriate:1} {Document review of labs and clinical decision tools ie heart score, Chads2Vasc2 etc:1}  {Document your independent review of radiology images, and any outside records:1} {Document your discussion with family members, caretakers, and with consultants:1} {Document social determinants of health affecting  pt's care:1} {Document your decision making why or why not admission, treatments were needed:1} Final Clinical Impression(s) / ED Diagnoses Final diagnoses:  Lower abdominal pain    Rx / DC Orders ED Discharge Orders          Ordered    oxyCODONE-acetaminophen (PERCOCET/ROXICET) 5-325 MG tablet  Every 6 hours PRN        08/29/22 2135    ondansetron (ZOFRAN-ODT) 4 MG disintegrating tablet        08/29/22 2135    oxyCODONE-acetaminophen (PERCOCET/ROXICET) 5-325 MG tablet  Every 6 hours PRN        08/29/22 2135

## 2022-08-29 NOTE — Discharge Instructions (Addendum)
Drink plenty of fluids.  Follow-up Thursday as planned.  I spoke with Dr. Abbey Chatters and he knows about your results.

## 2022-08-30 MED FILL — Oxycodone w/ Acetaminophen Tab 5-325 MG: ORAL | Qty: 6 | Status: AC

## 2022-08-31 ENCOUNTER — Telehealth: Payer: Self-pay | Admitting: *Deleted

## 2022-08-31 ENCOUNTER — Encounter: Payer: Self-pay | Admitting: Internal Medicine

## 2022-08-31 ENCOUNTER — Ambulatory Visit (INDEPENDENT_AMBULATORY_CARE_PROVIDER_SITE_OTHER): Payer: Medicare Other | Admitting: Internal Medicine

## 2022-08-31 VITALS — BP 116/70 | HR 68 | Temp 97.9°F | Ht 71.0 in | Wt 171.8 lb

## 2022-08-31 DIAGNOSIS — R634 Abnormal weight loss: Secondary | ICD-10-CM | POA: Diagnosis not present

## 2022-08-31 DIAGNOSIS — R9389 Abnormal findings on diagnostic imaging of other specified body structures: Secondary | ICD-10-CM

## 2022-08-31 DIAGNOSIS — G8929 Other chronic pain: Secondary | ICD-10-CM

## 2022-08-31 DIAGNOSIS — R053 Chronic cough: Secondary | ICD-10-CM | POA: Diagnosis not present

## 2022-08-31 DIAGNOSIS — R1013 Epigastric pain: Secondary | ICD-10-CM

## 2022-08-31 DIAGNOSIS — K529 Noninfective gastroenteritis and colitis, unspecified: Secondary | ICD-10-CM | POA: Diagnosis not present

## 2022-08-31 DIAGNOSIS — F17200 Nicotine dependence, unspecified, uncomplicated: Secondary | ICD-10-CM

## 2022-08-31 NOTE — Progress Notes (Signed)
Referring Provider: No ref. provider found Primary Care Physician:  Eloise Harman, DO Primary GI:  Dr. Abbey Chatters  Chief Complaint  Patient presents with   Follow-up    ED follow up, pt's wife states he is no better but he is on percocet every 6 hrs. Pt woke up at 6:30 am this morning    HPI:   Nicholas Dominguez is a 71 y.o. male who presents to clinic today for follow-up visit.  History of chronic GERD well-controlled on omeprazole.  Upper endoscopy 04/06/2022 with gastritis, negative for H. pylori.  Colonoscopy 04/06/2022 with 2 polyps removed, 1 adenomatous colon polyp, 5-year recall.  Main complaint for me is ongoing abdominal pain, periumbilical, epigastric, mild to moderate severity.  Intermittent in nature.  Also with weight loss.  Has lost another 9 lbs since last visit 9/20. Total of 55 lbs in 1 years. Notes poor appetite.   Recent ER visit 08/29/22. CT abdomen pelvis with contrast showed tree-in-bud opacities in the right middle lobe, bronchial thickening right lower lobe with mucoid impaction.  Small bowel diffusely filled and edematous primarily in the left and central abdomen with marked bowel wall thickening and adjacent mesenteric edema and inflammation no pneumatosis, central mesenteric adenopathy that is larger than typically seen with reactive lymph nodes.  Recommended repeat CT after resolution to exclude possibility of lymphoproliferative disorder.  CT abdomen pelvis with contrast 03/22/2022 unremarkable from GI standpoint.  Today, main complaint for me is ongoing abdominal pain, periumbilical, epigastric, mild to moderate severity.  Intermittent in nature.  Also with weight loss.  Has lost another 9 lbs since last visit 9/20. Total of 55 lbs in 1 years. Notes poor appetite.  Pain medication is helping.  Past Medical History:  Diagnosis Date   Tobacco use     Past Surgical History:  Procedure Laterality Date   BIOPSY  04/06/2022   Procedure: BIOPSY;  Surgeon: Eloise Harman, DO;  [facility]: [facility];  Service: Endoscopy;;   COLONOSCOPY WITH PROPOFOL N/A 04/06/2022   Procedure: COLONOSCOPY WITH PROPOFOL;  Surgeon: Eloise Harman, DO;  [facility];  Service: Endoscopy;  Laterality: N/A;  11:00am   ESOPHAGOGASTRODUODENOSCOPY (EGD) WITH PROPOFOL N/A 04/06/2022   Procedure: ESOPHAGOGASTRODUODENOSCOPY (EGD) WITH PROPOFOL;  Surgeon: Eloise Harman, DO;  [facility];  Service: Endoscopy;  Laterality: N/A;   POLYPECTOMY  04/06/2022   Procedure: POLYPECTOMY;  Surgeon: Eloise Harman, DO;  [facility];  Service: Endoscopy;;   TONSILLECTOMY     VASECTOMY      Current Outpatient Medications  Medication Sig Dispense Refill   aspirin 81 MG EC tablet Take 81 mg by mouth daily.     Multiple Vitamin (MULTI VITAMIN) TABS Take 1 tablet by mouth daily.     niacin 500 MG tablet Take 500 mg by mouth daily.     Omega-3 Fatty Acids (FISH OIL) 1000 MG CAPS Take 1,000 mg by mouth daily.     omeprazole (PRILOSEC) 40 MG capsule Take 1 capsule (40 mg total) by mouth daily. 30 capsule 11   oxyCODONE-acetaminophen (PERCOCET/ROXICET) 5-325 MG tablet Take 1 tablet by mouth every 6 (six) hours as needed for severe pain. 6 tablet 0   oxyCODONE-acetaminophen (PERCOCET/ROXICET) 5-325 MG tablet Take 1 tablet by mouth every 6 (six) hours as needed for severe pain. 10 tablet 0   lubiprostone (AMITIZA) 24 MCG capsule Take 1 capsule (24 mcg total) by mouth 2 (two) times daily with a  meal. (Patient not taking: Reported on 08/31/2022) 60 capsule 5   ondansetron (ZOFRAN-ODT) 4 MG disintegrating tablet '4mg'$  ODT q4 hours prn nausea/vomit (Patient not taking: Reported on 08/31/2022) 10 tablet 0   No current facility-administered medications for this visit.    Allergies as of 08/31/2022   (No Known Allergies)    Family History  Problem Relation Age of Onset   Heart disease Mother    Prostate cancer Father    Stroke Father     Social History    Socioeconomic History   Marital status: Married    Spouse name: Not on file   Number of children: Not on file   Years of education: Not on file   Highest education level: Not on file  Occupational History   Not on file  Tobacco Use   Smoking status: Some Days    Packs/day: 0.25    Types: Cigarettes    Passive exposure: Current   Smokeless tobacco: Never  Vaping Use   Vaping Use: Never used  Substance and Sexual Activity   Alcohol use: Not Currently   Drug use: Not Currently   Sexual activity: Yes  Other Topics Concern   Not on file  Social History Narrative   Not on file   Social Determinants of Health   Financial Resource Strain: Not on file  Food Insecurity: Not on file  Transportation Needs: Not on file  Physical Activity: Not on file  Stress: Not on file  Social Connections: Not on file    Subjective: Review of Systems  Constitutional:  Negative for chills and fever.  HENT:  Negative for congestion and hearing loss.   Eyes:  Negative for blurred vision and double vision.  Respiratory:  Negative for cough and shortness of breath.   Cardiovascular:  Negative for chest pain and palpitations.  Gastrointestinal:  Positive for abdominal pain, constipation and heartburn. Negative for blood in stool, diarrhea, melena and vomiting.  Genitourinary:  Negative for dysuria and urgency.  Musculoskeletal:  Negative for joint pain and myalgias.  Skin:  Negative for itching and rash.  Neurological:  Negative for dizziness and headaches.  Psychiatric/Behavioral:  Negative for depression. The patient is not nervous/anxious.      Objective: BP 116/70   Pulse 68   Temp 97.9 F (36.6 C)   Ht '5\' 11"'$  (1.803 m)   Wt 171 lb 12.8 oz (77.9 kg)   BMI 23.96 kg/m  Physical Exam Constitutional:      Appearance: Normal appearance.  HENT:     Head: Normocephalic and atraumatic.  Eyes:     Extraocular Movements: Extraocular movements intact.     Conjunctiva/sclera:  Conjunctivae normal.  Cardiovascular:     Rate and Rhythm: Normal rate and regular rhythm.  Pulmonary:     Effort: Pulmonary effort is normal.     Breath sounds: Normal breath sounds.  Abdominal:     General: Bowel sounds are normal.     Palpations: Abdomen is soft.  Musculoskeletal:        General: Normal range of motion.     Cervical back: Normal range of motion and neck supple.  Skin:    General: Skin is warm.  Neurological:     General: No focal deficit present.     Mental Status: He is alert and oriented to person, place, and time.  Psychiatric:        Mood and Affect: Mood normal.        Behavior: Behavior normal.  Assessment: *Chronic GERD-well-controlled on omeprazole *Abdominal pain *Weight loss *Abnormal CT imaging-enteritis and lymphadenopathy *Chronic mucoid cough  Plan: Chronic GERD well-controlled on omeprazole.  We will continue.  Discussed case in depth with general surgery who also reviewed CT imaging.  Will schedule for Givens capsule endoscopy to further evaluate findings.  Etiology unclear.  Given lack of nausea, vomiting, diarrhea, infectious enteritis seems less likely.  Pending Givens capsule findings, may need referral to general surgery for exploratory laparotomy versus repeat CT imaging in 4 to 6 weeks.  CT chest ordered to further evaluate lungs given abnormal lung findings on CT abdomen.  Refer to pulmonology for further evaluation given his chronic mucoid cough and chronic smoking history.  Follow-up 4 to 6 weeks.  08/31/2022 10:37 AM   Disclaimer: This note was dictated with voice recognition software. Similar sounding words can inadvertently be transcribed and may not be corrected upon review.

## 2022-08-31 NOTE — Telephone Encounter (Signed)
Referral sent to pulmonary

## 2022-08-31 NOTE — Patient Instructions (Signed)
I am going to discuss your case further with our general surgeon.  I do think you need to see a lung doctor given your abnormal lung findings on most recent CT and chronic mucus cough.  You may need repeat CT of your chest as well but I will hold off for now.  I will call you later today after I have spoken with the surgeon.  I am going to go ahead and put a follow-up visit to see me in 4 to 6 weeks just to have it on the books.  We can always cancel this if need be.  It was good seeing both you again today.  Dr. Abbey Chatters

## 2022-08-31 NOTE — Telephone Encounter (Signed)
Per Dr. Abbey Chatters "set him up for Givens capsule, Diagnosis enteritis, abnormal CT.  Also needs pulm referral for chronic mucoid cough, smoking, abnormal CT of the lungs. Can we also order a CAT scan of his lungs with contrast as well? "

## 2022-09-01 ENCOUNTER — Other Ambulatory Visit: Payer: Self-pay | Admitting: Internal Medicine

## 2022-09-01 MED ORDER — OXYCODONE-ACETAMINOPHEN 5-325 MG PO TABS: 1.0000 | ORAL_TABLET | Freq: Four times a day (QID) | ORAL | 0 refills | Status: AC | PRN

## 2022-09-01 NOTE — Telephone Encounter (Signed)
Called pt and spoke with spouse. Patient scheduled for givens 11/6 at 7:30am. Discussed instructions for given in detail. She voiced understanding. Ct chest scheduled for 11/20, arrival 8:15am, liquids only 4 hrs prior. She voiced understanding. Referral sent to pulmonary also.   She is wanting to know if Dr. Abbey Chatters would be willing to give Rx for his pain medication percocet 5-'325mg'$ . He is taking 1 every 6 hours. He got an rx for #10 when he went to ED and picked up yesterday but he will run out and patient has been in a lot of pain. Please advise Dr. Abbey Chatters. Thanks!

## 2022-09-01 NOTE — Telephone Encounter (Signed)
I have refilled his percocet with 10 tablets. Needs to use these sparingly.

## 2022-09-04 ENCOUNTER — Ambulatory Visit (HOSPITAL_COMMUNITY)
Admission: RE | Admit: 2022-09-04 | Discharge: 2022-09-04 | Disposition: A | Discharge status: Home / Self Care | Payer: Medicare Other | Source: Ambulatory Visit | Attending: Internal Medicine | Admitting: Internal Medicine

## 2022-09-04 ENCOUNTER — Encounter (HOSPITAL_COMMUNITY)
Admission: RE | Disposition: A | Discharge status: Home / Self Care | Payer: Self-pay | Source: Ambulatory Visit | Attending: Internal Medicine

## 2022-09-04 ENCOUNTER — Encounter: Payer: Self-pay | Admitting: Internal Medicine

## 2022-09-04 ENCOUNTER — Encounter (HOSPITAL_COMMUNITY): Payer: Self-pay

## 2022-09-04 ENCOUNTER — Ambulatory Visit (INDEPENDENT_AMBULATORY_CARE_PROVIDER_SITE_OTHER): Payer: Medicare Other | Admitting: Internal Medicine

## 2022-09-04 ENCOUNTER — Telehealth: Payer: Self-pay

## 2022-09-04 VITALS — BP 132/82 | HR 92 | Temp 97.9°F | Ht 71.0 in | Wt 167.8 lb

## 2022-09-04 DIAGNOSIS — Z86010 Personal history of colonic polyps: Secondary | ICD-10-CM | POA: Diagnosis not present

## 2022-09-04 DIAGNOSIS — K046 Periapical abscess with sinus: Secondary | ICD-10-CM

## 2022-09-04 DIAGNOSIS — Z8719 Personal history of other diseases of the digestive system: Secondary | ICD-10-CM | POA: Diagnosis not present

## 2022-09-04 DIAGNOSIS — R053 Chronic cough: Secondary | ICD-10-CM | POA: Insufficient documentation

## 2022-09-04 DIAGNOSIS — J340 Abscess, furuncle and carbuncle of nose: Secondary | ICD-10-CM | POA: Diagnosis not present

## 2022-09-04 DIAGNOSIS — R933 Abnormal findings on diagnostic imaging of other parts of digestive tract: Secondary | ICD-10-CM

## 2022-09-04 DIAGNOSIS — F17210 Nicotine dependence, cigarettes, uncomplicated: Secondary | ICD-10-CM

## 2022-09-04 DIAGNOSIS — R109 Unspecified abdominal pain: Secondary | ICD-10-CM | POA: Insufficient documentation

## 2022-09-04 DIAGNOSIS — R634 Abnormal weight loss: Secondary | ICD-10-CM

## 2022-09-04 HISTORY — PX: GIVENS CAPSULE STUDY: SHX5432

## 2022-09-04 SURGERY — IMAGING PROCEDURE, GI TRACT, INTRALUMINAL, VIA CAPSULE

## 2022-09-04 NOTE — Telephone Encounter (Signed)
Perfect! Thanks Raven! Were you able to have the sinus CT scheduled for the same day and time as well? If so, I can call and notify the patient.

## 2022-09-04 NOTE — Assessment & Plan Note (Addendum)
Active smoker - Onset  2022 ? Not related to covid as far as he can tell assoc with abd pain/wt loss - CT chest on abd cuts  08/29/22  Clustered tree-in-bud opacities in the right lower lobe and right middle lobe of the included lung bases, likely infectious or inflammatory. Right lower lobe bronchial thickening with mucoid impaction.  - Sputum samples 09/04/2022 >>> - Quant TB 09/04/2022 >>>   The abd pain and wt loss may be unrelated to lung finds  but the connection may be related to aspirating/ refluxing or has inflammatory bowel dz (esp Crohn's dz) so will check above lab results and have treat him symptomatically for now (avoiding abx due to GI symptoms ) and having him work on smoking cessation while preceding with HRCT chest and sinus CT to complete the w/u

## 2022-09-04 NOTE — Telephone Encounter (Signed)
The order was placed today. Thanks!

## 2022-09-04 NOTE — Patient Instructions (Addendum)
For cough > mucinex dm 1200 mg every 12 hours as needed  We will add a sinus CT on same day as your chest ct to complete your work up   Sputum cups 2 separate >>>  submit these as soon as soon as you can for testing   Stop fish oil continue the prilosec (40 mg)  Take 30-60 min before first meal of the day   GERD (REFLUX)  is an extremely common cause of respiratory symptoms just like yours , many times with no obvious heartburn at all.    It can be treated with medication, but also with lifestyle changes including elevation of the head of your bed (ideally with 6 -8inch blocks under the headboard of your bed),  Smoking cessation, avoidance of late meals, excessive alcohol, and avoid fatty foods, chocolate, peppermint, colas, red wine, and acidic juices such as orange juice.  NO MINT OR MENTHOL PRODUCTS SO NO COUGH DROPS  USE SUGARLESS CANDY INSTEAD (Jolley ranchers or Stover's or Life Savers) or even ice chips will also do - the key is to swallow to prevent all throat clearing. NO OIL BASED VITAMINS - use powdered substitutes.  Avoid fish oil when coughing.   The key is to stop smoking completely before smoking completely stops you!    I will call you after I get back your sputum samples and message Dr Abbey Chatters re your GI diagnosis    Please remember to go to the lab department   for your tests - we will call you with the results when they are available.

## 2022-09-04 NOTE — Telephone Encounter (Signed)
Order for HRCT placed.

## 2022-09-04 NOTE — Telephone Encounter (Signed)
Patient has a CT scheduled for 11/20 at [facility]. Dr. Melvyn Novas is wanting to change this to a High Res CT scan in addition to having a sinus CT done that day and time as well. PCCs please advise. Adding Dr. Melvyn Novas as well so he is aware of response.

## 2022-09-04 NOTE — Progress Notes (Signed)
Nicholas Dominguez, male    DOB: Mar 11, 1951    MRN: [id number]   Brief patient profile:  46  yowm active smoker  referred to pulmonary clinic in [place]  09/04/2022 by Dr Abbey Chatters  for cough in setting of rhinitis year round superimposed on chronic abd pain/ wt loss ? Etiology  -  he had a smoker's cough for years but this changed about the same time as abd pain developed      History of Present Illness  09/04/2022  Pulmonary/ 1st office eval/ [facility] Choy / [place] [facility]  Chief Complaint  Patient presents with   Consult    Chronic cough and abnormal CT scan  New CT scan ordered for 11/20  Weight loss   Dyspnea:  MMRC1 = can walk nl pace, flat grade, can't hurry or go uphills or steps s sob   Cough: variably present daytime, never wakes up with it brown mucus / never abx  Sleep: 45 degrees due to abd apin SABA use: none 02: none  Change in bowel habits = more constipated    No obvious day to day or daytime pattern/variability or assoc   mucus plugs or hemoptysis or cp or chest tightness, subjective wheeze or overt hb symptoms.   Sleeping  without nocturnal  or early am exacerbation  of respiratory  c/o's or need for noct saba. Also denies any obvious fluctuation of symptoms with weather or environmental changes or other aggravating or alleviating factors except as outlined above   No unusual exposure hx or h/o childhood pna/ asthma or knowledge of premature birth.  Current Allergies, Complete Past Medical History, Past Surgical History, Family History, and Social History were reviewed in [employer] record.  ROS  The following are not active complaints unless bolded Hoarseness, sore throat, dysphagia, dental problems, itching, sneezing,  nasal congestion or discharge of excess mucus or purulent secretions, ear ache,   fever, chills, sweats, unintended wt loss or wt gain, classically pleuritic or exertional cp,  orthopnea pnd or arm/hand swelling  or leg swelling,  presyncope, palpitations, abdominal pain, anorexia, nausea, vomiting, diarrhea  or change in bowel habits or change in bladder habits, change in stools or change in urine, dysuria, hematuria,  rash, arthralgias, visual complaints, headache, numbness, weakness or ataxia or problems with walking or coordination,  change in mood or  memory.             Past Medical History:  Diagnosis Date   Tobacco use     Outpatient Medications Prior to Visit  Medication Sig Dispense Refill   aspirin 81 MG EC tablet Take 81 mg by mouth daily.     Multiple Vitamin (MULTI VITAMIN) TABS Take 1 tablet by mouth daily.     niacin 500 MG tablet Take 500 mg by mouth daily.     Omega-3 Fatty Acids (FISH OIL) 1000 MG CAPS Take 1,000 mg by mouth daily.     omeprazole (PRILOSEC) 40 MG capsule Take 1 capsule (40 mg total) by mouth daily. 30 capsule 11   oxyCODONE-acetaminophen (PERCOCET/ROXICET) 5-325 MG tablet Take 1 tablet by mouth every 6 (six) hours as needed for up to 5 days for severe pain. 10 tablet 0   lubiprostone (AMITIZA) 24 MCG capsule Take 1 capsule (24 mcg total) by mouth 2 (two) times daily with a meal. (Patient not taking: Reported on 08/31/2022) 60 capsule 5   ondansetron (ZOFRAN-ODT) 4 MG disintegrating tablet '4mg'$  ODT q4 hours prn nausea/vomit 10 tablet 0  No facility-administered medications prior to visit.     Objective:     BP 132/82   Pulse 92   Temp 97.9 F (36.6 C) (Temporal)   Ht '5\' 11"'$  (1.803 m)   Wt 167 lb 12.8 oz (76.1 kg)   SpO2 95% Comment: room air  BMI 23.40 kg/m   SpO2: 95 % (room air)  amb wm     HEENT : Oropharynx  clear/ upper denture      Nasal turbinates mild edema   NECK :  without  apparent JVD/ palpable Nodes/TM    LUNGS: no acc muscle use,  Nl contour chest with minimal insp /exp rhonchi despite rattling chest cough on FVC    CV:  RRR  no s3 or murmur or increase in P2, and no edema   ABD:  soft and nontender with nl inspiratory excursion in the supine  position. No bruits or organomegaly appreciated   MS:  Nl gait/ ext warm without deformities Or obvious joint restrictions  calf tenderness, cyanosis or clubbing    SKIN: warm and dry without lesions    NEURO:  alert, approp, nl sensorium with  no motor or cerebellar deficits apparent.       Assessment   Chronic cough Active smoker - Onset  2022 ? Not related to covid as far as he can tell assoc with abd pain/wt loss - CT chest on abd cuts  08/29/22  Clustered tree-in-bud opacities in the right lower lobe and right middle lobe of the included lung bases, likely infectious or inflammatory. Right lower lobe bronchial thickening with mucoid impaction.  - Sputum samples 09/04/2022 >>> - Quant TB 09/04/2022 >>>   The abd pain and wt loss may be unrelated to lung finds  but the connection may be related to aspirating/ refluxing or has inflammatory bowel dz (esp Crohn's dz) so will check above lab results and have treat him symptomatically for now (avoiding abx due to GI symptoms ) and having him work on smoking cessation while preceding with HRCT chest and sinus CT to complete the w/u   Weight loss TSH sent to be complete  Cigarette smoker Counseled re importance of smoking cessation but did not meet time criteria for separate billing    Each maintenance medication was reviewed in detail including emphasizing most importantly the difference between maintenance and prns and under what circumstances the prns are to be triggered using an action plan format where appropriate.  Total time for H and P, chart review, counseling,   and generating customized AVS unique to this office visit / same day charting  > 45 mn with pt new to me         Christinia Gully, MD 09/04/2022

## 2022-09-04 NOTE — Telephone Encounter (Signed)
Phoned and advised of the pt's regarding his Rx for percocet. Pt expressed understanding. The pt will be back at the hospital today to have the equipment taken off and they are very eager to hear from you regarding the camera results. I advised them I would let you know of this.

## 2022-09-04 NOTE — Assessment & Plan Note (Signed)
TSH sent to be complete

## 2022-09-04 NOTE — Assessment & Plan Note (Addendum)
Counseled re importance of smoking cessation but did not meet time criteria for separate billing    Each maintenance medication was reviewed in detail including emphasizing most importantly the difference between maintenance and prns and under what circumstances the prns are to be triggered using an action plan format where appropriate.  Total time for H and P, chart review, counseling,   and generating customized AVS unique to this office visit / same day charting  > 45 mn with pt new to me

## 2022-09-05 LAB — TSH: TSH: 1.03 u[IU]/mL (ref 0.450–4.500)

## 2022-09-05 NOTE — Telephone Encounter (Signed)
Sinus CT has been scheduled. Nothing further needed

## 2022-09-06 LAB — QUANTIFERON-TB GOLD PLUS
QuantiFERON Mitogen Value: >10 IU/mL
QuantiFERON Nil Value: 0 IU/mL
QuantiFERON TB1 Ag Value: 0 IU/mL
QuantiFERON TB2 Ag Value: 0 IU/mL
QuantiFERON-TB Gold Plus: NEGATIVE

## 2022-09-06 NOTE — H&P (Signed)
Patient presenting for capsule endoscopy today due to abnormal CT findings, abdominal pain.

## 2022-09-07 ENCOUNTER — Telehealth: Payer: Self-pay

## 2022-09-07 DIAGNOSIS — K046 Periapical abscess with sinus: Secondary | ICD-10-CM

## 2022-09-07 DIAGNOSIS — J340 Abscess, furuncle and carbuncle of nose: Secondary | ICD-10-CM

## 2022-09-07 NOTE — Telephone Encounter (Signed)
Order has been changed

## 2022-09-07 NOTE — Telephone Encounter (Signed)
-----   Message from Dawayne Cirri sent at 09/07/2022 12:40 PM EST ----- Regarding: Wrong CPT Code for CT Maxillofacial I am working on [place] and the code in the chart [phone] is for CT Limited Localized follow up study.  The correct CPT code is [phone] CT Maxillofacial without contrast material. Can I please get a new order. Thanks

## 2022-09-08 ENCOUNTER — Telehealth: Payer: Self-pay | Admitting: Gastroenterology

## 2022-09-08 ENCOUNTER — Encounter (HOSPITAL_COMMUNITY): Payer: Self-pay | Admitting: Internal Medicine

## 2022-09-08 DIAGNOSIS — K529 Noninfective gastroenteritis and colitis, unspecified: Secondary | ICD-10-CM

## 2022-09-08 DIAGNOSIS — R9389 Abnormal findings on diagnostic imaging of other specified body structures: Secondary | ICD-10-CM

## 2022-09-08 LAB — CBC WITH DIFFERENTIAL/PLATELET
Basophils Absolute: 0.1 10*3/uL (ref 0.0–0.2)
Basos: 1 %
EOS (ABSOLUTE): 0.3 10*3/uL (ref 0.0–0.4)
Eos: 3 %
Hematocrit: 42.2 % (ref 37.5–51.0)
Hemoglobin: 13.7 g/dL (ref 13.0–17.7)
Immature Grans (Abs): 0 10*3/uL (ref 0.0–0.1)
Immature Granulocytes: 0 %
Lymphocytes Absolute: 2 10*3/uL (ref 0.7–3.1)
Lymphs: 20 %
MCH: 31.3 pg (ref 26.6–33.0)
MCHC: 32.5 g/dL (ref 31.5–35.7)
MCV: 96 fL (ref 79–97)
Monocytes Absolute: 0.8 10*3/uL (ref 0.1–0.9)
Monocytes: 8 %
Neutrophils Absolute: 6.6 10*3/uL (ref 1.4–7.0)
Neutrophils: 68 %
Platelets: 442 10*3/uL (ref 150–450)
RBC: 4.38 x10E6/uL (ref 4.14–5.80)
RDW: 12.4 % (ref 11.6–15.4)
WBC: 9.7 10*3/uL (ref 3.4–10.8)

## 2022-09-08 LAB — ALPHA-1-ANTITRYPSIN PHENOTYP: A-1 Antitrypsin: 246 mg/dL — ABNORMAL HIGH (ref 101–187)

## 2022-09-08 LAB — SEDIMENTATION RATE: Sed Rate: 2 mm/hr (ref 0–30)

## 2022-09-08 NOTE — Telephone Encounter (Signed)
Pt's wife Neoma Laming (DPR on file) was advised and verbalized understanding.

## 2022-09-08 NOTE — Addendum Note (Signed)
Addended by: Cheron Every on: 09/08/2022 08:00 AM   Modules accepted: Orders

## 2022-09-08 NOTE — Telephone Encounter (Signed)
Nicholas Dominguez when you call pt, he does not need appt for abd xray. He can walk in to [facility]

## 2022-09-08 NOTE — Telephone Encounter (Signed)
Please let patient know that Dr. Abbey Chatters is reading over his Givens small bowel capsule but it is noted that the capsule never reached the colon and there was significant amount of food debris obscuring the views of the small bowel. We will be in touch with further recommendations but he will need to have abdominal xray next week to see if the capsule passed.   Please arrange KUB next week to verify capsule passed.

## 2022-09-11 ENCOUNTER — Ambulatory Visit (HOSPITAL_COMMUNITY)
Admission: RE | Admit: 2022-09-11 | Discharge: 2022-09-11 | Disposition: A | Discharge status: Home / Self Care | Payer: Medicare Other | Source: Ambulatory Visit | Attending: Gastroenterology | Admitting: Gastroenterology

## 2022-09-11 ENCOUNTER — Telehealth: Payer: Self-pay

## 2022-09-11 DIAGNOSIS — K529 Noninfective gastroenteritis and colitis, unspecified: Secondary | ICD-10-CM | POA: Diagnosis present

## 2022-09-11 NOTE — Telephone Encounter (Signed)
Pt's wife just called and stated she is on the way to the ED with her husband. She LMOVM stating the pt had a terrible weekend with nausea, vomiting and diarrhea. Pt is going to have the abdomen xray ordered by Neil Crouch while they are there.

## 2022-09-11 NOTE — Telephone Encounter (Signed)
Please follow up with Dr. Abbey Chatters about this patient. I have never seen him, I read his capsule study only.

## 2022-09-11 NOTE — Telephone Encounter (Signed)
noted 

## 2022-09-13 ENCOUNTER — Telehealth: Payer: Self-pay | Admitting: Internal Medicine

## 2022-09-13 DIAGNOSIS — R634 Abnormal weight loss: Secondary | ICD-10-CM

## 2022-09-13 DIAGNOSIS — K529 Noninfective gastroenteritis and colitis, unspecified: Secondary | ICD-10-CM

## 2022-09-13 NOTE — Telephone Encounter (Signed)
I called patient's wife and discussed capsule results with her.  Appears capsule is still in his abdomen, likely small bowel, possibly colon based on x-ray.  Small bowel still dilated as well as his colon.  Also discussed case further with Dr. Arnoldo Morale again.  Patient is scheduled for CT of his chest on 09/18/2022.  [name], can we add CT abdomen pelvis to be performed at the same time?  You will likely need to call radiology to set this up.  He may need exploratory laparotomy to further evaluate.  Counseled patient's wife if he has intractable nausea vomiting, worsening abdominal pain, he needs to go to the ER for evaluation.  She is agreeable.  States he is eating the last 2 days though not much.  Having bowel movements.  No nausea and vomiting since Friday.

## 2022-09-14 DIAGNOSIS — R933 Abnormal findings on diagnostic imaging of other parts of digestive tract: Secondary | ICD-10-CM

## 2022-09-14 NOTE — Telephone Encounter (Signed)
noted 

## 2022-09-14 NOTE — Telephone Encounter (Signed)
Called central scheduling and CT a/p added same day has CT chest

## 2022-09-14 NOTE — Addendum Note (Signed)
Addended by: Cheron Every on: 09/14/2022 10:36 AM   Modules accepted: Orders

## 2022-09-14 NOTE — Op Note (Addendum)
  Small Bowel Givens Capsule Study Procedure date:  09/04/22  Referring Provider:  Dr. Abbey Chatters PCP:  Dr. Abbey Chatters, Elon Alas, DO  Indication for procedure:  abnormal CT imaging of small bowel, abdominal pain, weight loss. EGD 03/2022 with gastritis, H.pylori negative. Colonoscopy 03/2022 with two polyps removed, one which was adenomatous.  Patient data:  Wt: 77.9 kg Ht: 5'11'  Findings:  Capsule study incomplete. Capsule never reached the colon. Significant portions of small bowel could not been seen due to retained food particles/effluent.  At 25 minutes 18 seconds, possible lymphangiectasia. At 1 hour 59 minutes 1 second, possible superficial ulcer.  At 7 hours 19 minutes 11 seconds, possible superficial ulcer.  First Gastric image:  51 seconds First Duodenal image: 12 minutes 9 seconds First Ileo-Cecal Valve image: N/A First Cecal image: N/A Gastric Passage time: 11 minutes  Small Bowel Passage time: N/A   Summary & Recommendations:  Incomplete small bowel capsule study.  Capsule never reached the colon.  Significant portions of small bowel mucosa not seen.  Possible 2 superficial ulcers as outlined above.  Patient will need abdominal x-ray to verify capsule passes. Capsule reviewed by Dr. Abbey Chatters as well. Dr. Abbey Chatters to provide further recommendations after speaking with general surgery regarding CT findings.  Laureen Ochs. Bernarda Caffey [facility] [phone] 11/16/20231:06 PM

## 2022-09-18 ENCOUNTER — Ambulatory Visit (HOSPITAL_COMMUNITY)
Admission: RE | Admit: 2022-09-18 | Discharge: 2022-09-18 | Disposition: A | Discharge status: Home / Self Care | Payer: Medicare Other | Source: Ambulatory Visit | Attending: Internal Medicine | Admitting: Internal Medicine

## 2022-09-18 ENCOUNTER — Encounter: Payer: Self-pay | Admitting: Internal Medicine

## 2022-09-18 DIAGNOSIS — R634 Abnormal weight loss: Secondary | ICD-10-CM | POA: Insufficient documentation

## 2022-09-18 DIAGNOSIS — K529 Noninfective gastroenteritis and colitis, unspecified: Secondary | ICD-10-CM | POA: Insufficient documentation

## 2022-09-18 DIAGNOSIS — K046 Periapical abscess with sinus: Secondary | ICD-10-CM | POA: Insufficient documentation

## 2022-09-18 DIAGNOSIS — R053 Chronic cough: Secondary | ICD-10-CM

## 2022-09-18 DIAGNOSIS — K565 Intestinal adhesions [bands], unspecified as to partial versus complete obstruction: Secondary | ICD-10-CM | POA: Diagnosis not present

## 2022-09-18 DIAGNOSIS — J340 Abscess, furuncle and carbuncle of nose: Secondary | ICD-10-CM

## 2022-09-18 DIAGNOSIS — K56609 Unspecified intestinal obstruction, unspecified as to partial versus complete obstruction: Secondary | ICD-10-CM | POA: Diagnosis not present

## 2022-09-18 DIAGNOSIS — R911 Solitary pulmonary nodule: Secondary | ICD-10-CM | POA: Insufficient documentation

## 2022-09-18 MED ORDER — IOHEXOL 300 MG/ML  SOLN
100.0000 mL | Freq: Once | INTRAMUSCULAR | Status: AC | PRN
  Administered 2022-09-18: 100 mL via INTRAVENOUS

## 2022-09-19 ENCOUNTER — Telehealth: Payer: Self-pay

## 2022-09-19 ENCOUNTER — Inpatient Hospital Stay (HOSPITAL_COMMUNITY)
Admission: EM | Admit: 2022-09-19 | Discharge: 2022-09-25 | DRG: 329 | Disposition: A | Discharge status: Home / Self Care | Payer: Medicare Other | Attending: General Surgery | Admitting: General Surgery

## 2022-09-19 ENCOUNTER — Telehealth: Payer: Self-pay | Admitting: Internal Medicine

## 2022-09-19 ENCOUNTER — Other Ambulatory Visit: Payer: Self-pay

## 2022-09-19 ENCOUNTER — Inpatient Hospital Stay (HOSPITAL_COMMUNITY): Payer: Medicare Other

## 2022-09-19 ENCOUNTER — Other Ambulatory Visit: Payer: Self-pay | Admitting: Internal Medicine

## 2022-09-19 DIAGNOSIS — R053 Chronic cough: Secondary | ICD-10-CM | POA: Diagnosis present

## 2022-09-19 DIAGNOSIS — R590 Localized enlarged lymph nodes: Secondary | ICD-10-CM | POA: Diagnosis present

## 2022-09-19 DIAGNOSIS — F17210 Nicotine dependence, cigarettes, uncomplicated: Secondary | ICD-10-CM | POA: Diagnosis present

## 2022-09-19 DIAGNOSIS — J340 Abscess, furuncle and carbuncle of nose: Secondary | ICD-10-CM | POA: Diagnosis present

## 2022-09-19 DIAGNOSIS — K56609 Unspecified intestinal obstruction, unspecified as to partial versus complete obstruction: Secondary | ICD-10-CM | POA: Diagnosis present

## 2022-09-19 DIAGNOSIS — Z79899 Other long term (current) drug therapy: Secondary | ICD-10-CM | POA: Diagnosis not present

## 2022-09-19 DIAGNOSIS — K219 Gastro-esophageal reflux disease without esophagitis: Secondary | ICD-10-CM | POA: Diagnosis present

## 2022-09-19 DIAGNOSIS — E43 Unspecified severe protein-calorie malnutrition: Secondary | ICD-10-CM | POA: Diagnosis present

## 2022-09-19 DIAGNOSIS — Z6823 Body mass index (BMI) 23.0-23.9, adult: Secondary | ICD-10-CM | POA: Diagnosis not present

## 2022-09-19 DIAGNOSIS — K56699 Other intestinal obstruction unspecified as to partial versus complete obstruction: Secondary | ICD-10-CM | POA: Diagnosis not present

## 2022-09-19 DIAGNOSIS — K565 Intestinal adhesions [bands], unspecified as to partial versus complete obstruction: Principal | ICD-10-CM | POA: Diagnosis present

## 2022-09-19 DIAGNOSIS — R634 Abnormal weight loss: Secondary | ICD-10-CM | POA: Diagnosis present

## 2022-09-19 DIAGNOSIS — K529 Noninfective gastroenteritis and colitis, unspecified: Secondary | ICD-10-CM | POA: Diagnosis present

## 2022-09-19 DIAGNOSIS — K046 Periapical abscess with sinus: Secondary | ICD-10-CM | POA: Diagnosis present

## 2022-09-19 DIAGNOSIS — Z7982 Long term (current) use of aspirin: Secondary | ICD-10-CM | POA: Diagnosis not present

## 2022-09-19 DIAGNOSIS — R911 Solitary pulmonary nodule: Secondary | ICD-10-CM

## 2022-09-19 DIAGNOSIS — Z8249 Family history of ischemic heart disease and other diseases of the circulatory system: Secondary | ICD-10-CM | POA: Diagnosis not present

## 2022-09-19 LAB — COMPREHENSIVE METABOLIC PANEL
ALT: 8 U/L (ref 0–44)
AST: 19 U/L (ref 15–41)
Albumin: 3.7 g/dL (ref 3.5–5.0)
Alkaline Phosphatase: 117 U/L (ref 38–126)
Anion gap: 7 (ref 5–15)
BUN: 27 mg/dL — ABNORMAL HIGH (ref 8–23)
CO2: 26 mmol/L (ref 22–32)
Calcium: 9.3 mg/dL (ref 8.9–10.3)
Chloride: 103 mmol/L (ref 98–111)
Creatinine, Ser: 0.97 mg/dL (ref 0.61–1.24)
GFR, Estimated: >60 mL/min (ref >60)
Glucose, Bld: 107 mg/dL — ABNORMAL HIGH (ref 70–99)
Potassium: 3.7 mmol/L (ref 3.5–5.1)
Sodium: 136 mmol/L (ref 135–145)
Total Bilirubin: 1.5 mg/dL — ABNORMAL HIGH (ref 0.3–1.2)
Total Protein: 6.7 g/dL (ref 6.5–8.1)

## 2022-09-19 LAB — CBC WITH DIFFERENTIAL/PLATELET
Abs Immature Granulocytes: 0.03 10*3/uL (ref 0.00–0.07)
Basophils Absolute: 0.1 10*3/uL (ref 0.0–0.1)
Basophils Relative: 1 %
Eosinophils Absolute: 0.2 10*3/uL (ref 0.0–0.5)
Eosinophils Relative: 1 %
HCT: 40.3 % (ref 39.0–52.0)
Hemoglobin: 13.5 g/dL (ref 13.0–17.0)
Immature Granulocytes: 0 %
Lymphocytes Relative: 13 %
Lymphs Abs: 1.9 10*3/uL (ref 0.7–4.0)
MCH: 31.6 pg (ref 26.0–34.0)
MCHC: 33.5 g/dL (ref 30.0–36.0)
MCV: 94.4 fL (ref 80.0–100.0)
Monocytes Absolute: 1.1 10*3/uL — ABNORMAL HIGH (ref 0.1–1.0)
Monocytes Relative: 7 %
Neutro Abs: 11.4 10*3/uL — ABNORMAL HIGH (ref 1.7–7.7)
Neutrophils Relative %: 78 %
Platelets: 424 10*3/uL — ABNORMAL HIGH (ref 150–400)
RBC: 4.27 MIL/uL (ref 4.22–5.81)
RDW: 13.3 % (ref 11.5–15.5)
WBC: 14.6 10*3/uL — ABNORMAL HIGH (ref 4.0–10.5)
nRBC: 0 % (ref 0.0–0.2)

## 2022-09-19 LAB — SURGICAL PCR SCREEN
MRSA, PCR: NEGATIVE
Staphylococcus aureus: NEGATIVE

## 2022-09-19 LAB — LIPASE, BLOOD: Lipase: 76 U/L — ABNORMAL HIGH (ref 11–51)

## 2022-09-19 MED ORDER — LACTATED RINGERS IV SOLN: INTRAVENOUS | Status: DC

## 2022-09-19 MED ORDER — DIPHENHYDRAMINE HCL 12.5 MG/5ML PO ELIX: 12.5000 mg | ORAL_SOLUTION | Freq: Four times a day (QID) | ORAL | Status: DC | PRN

## 2022-09-19 MED ORDER — ENOXAPARIN SODIUM 40 MG/0.4ML IJ SOSY
40.0000 mg | PREFILLED_SYRINGE | INTRAMUSCULAR | Status: DC
  Administered 2022-09-19 – 2022-09-24 (×6): 40 mg via SUBCUTANEOUS
  Filled 2022-09-19 (×6): qty 0.4

## 2022-09-19 MED ORDER — PANTOPRAZOLE SODIUM 40 MG IV SOLR
40.0000 mg | Freq: Every day | INTRAVENOUS | Status: DC
  Administered 2022-09-19 – 2022-09-24 (×6): 40 mg via INTRAVENOUS
  Filled 2022-09-19 (×6): qty 10

## 2022-09-19 MED ORDER — MORPHINE SULFATE (PF) 2 MG/ML IV SOLN
2.0000 mg | INTRAVENOUS | Status: DC | PRN
  Administered 2022-09-19 – 2022-09-23 (×13): 2 mg via INTRAVENOUS
  Filled 2022-09-19 (×14): qty 1

## 2022-09-19 MED ORDER — DIPHENHYDRAMINE HCL 50 MG/ML IJ SOLN: 12.5000 mg | Freq: Four times a day (QID) | INTRAMUSCULAR | Status: DC | PRN

## 2022-09-19 MED ORDER — SIMETHICONE 80 MG PO CHEW: 40.0000 mg | CHEWABLE_TABLET | Freq: Four times a day (QID) | ORAL | Status: DC | PRN

## 2022-09-19 MED ORDER — MUPIROCIN 2 % EX OINT
1.0000 | TOPICAL_OINTMENT | Freq: Two times a day (BID) | CUTANEOUS | Status: AC
  Administered 2022-09-19 – 2022-09-24 (×9): 1 via NASAL
  Filled 2022-09-19: qty 22

## 2022-09-19 MED ORDER — ONDANSETRON HCL 4 MG/2ML IJ SOLN
4.0000 mg | Freq: Four times a day (QID) | INTRAMUSCULAR | Status: DC | PRN
  Administered 2022-09-19: 4 mg via INTRAVENOUS
  Filled 2022-09-19: qty 2

## 2022-09-19 MED ORDER — METOPROLOL TARTRATE 5 MG/5ML IV SOLN: 5.0000 mg | Freq: Four times a day (QID) | INTRAVENOUS | Status: DC | PRN

## 2022-09-19 MED ORDER — ONDANSETRON 4 MG PO TBDP: 4.0000 mg | ORAL_TABLET | Freq: Four times a day (QID) | ORAL | Status: DC | PRN

## 2022-09-19 NOTE — Telephone Encounter (Signed)
[facility] called regarding stat report on this pt. Please advise

## 2022-09-19 NOTE — ED Triage Notes (Signed)
Pt had CT scans done yesterday and Dr. Hurshel Keys called pt this morning to come back to hospital for admission, Per pt, states there is a bowel blockage

## 2022-09-19 NOTE — Progress Notes (Signed)
Called and spoke with the pt's spouse ok per DPR. Made aware of results/recs. CT ordered for 4.5 months.

## 2022-09-19 NOTE — H&P (Signed)
[facility] History and Physical  Reason for Referral: SBO, possible mass, capsule from SB study Referring Physician: Dr.Carver  Chief Complaint   Abdominal Pain     Nicholas Dominguez is a 71 y.o. male.  HPI: Nicholas Dominguez is a 71 yo with reported 60 lbs of weight loss and negative EGD and colonoscopy. Attempt at a SB capsule was unsuccessful with possible ulcers but now the capsule is stuck. The patient had a CT a few weeks back with possible enteritis. The patient has been having diarrhea and vomited on Sunday but has not vomited since. His intake has not been great. He is here with his wife.   Discussed the CT with the patient and his wife and concern for this SBO and possible mass in the area of the transition.   Past Medical History:  Diagnosis Date   Tobacco use     Past Surgical History:  Procedure Laterality Date   BIOPSY  04/06/2022   Procedure: BIOPSY;  Surgeon: Eloise Harman, DO;  Location: [facility];  Service: Endoscopy;;   COLONOSCOPY WITH PROPOFOL N/A 04/06/2022   Procedure: COLONOSCOPY WITH PROPOFOL;  Surgeon: Eloise Harman, DO;  Location: [facility];  Service: Endoscopy;  Laterality: N/A;  11:00am   ESOPHAGOGASTRODUODENOSCOPY (EGD) WITH PROPOFOL N/A 04/06/2022   Procedure: ESOPHAGOGASTRODUODENOSCOPY (EGD) WITH PROPOFOL;  Surgeon: Eloise Harman, DO;  Location: [facility];  Service: Endoscopy;  Laterality: N/A;   GIVENS CAPSULE STUDY N/A 09/04/2022   Procedure: GIVENS CAPSULE STUDY;  Surgeon: Eloise Harman, DO;  Location: [facility];  Service: Endoscopy;  Laterality: N/A;  7:30am   POLYPECTOMY  04/06/2022   Procedure: POLYPECTOMY;  Surgeon: Eloise Harman, DO;  Location: [facility];  Service: Endoscopy;;   TONSILLECTOMY     VASECTOMY      Family History  Problem Relation Age of Onset   Heart disease Mother    Prostate cancer Father    Stroke Father     Social History   Tobacco Use   Smoking status: Some Days     Packs/day: 0.25    Types: Cigarettes    Passive exposure: Current   Smokeless tobacco: Never  Vaping Use   Vaping Use: Never used  Substance Use Topics   Alcohol use: Not Currently   Drug use: Not Currently    Medications: I have reviewed the patient's current medications. Scheduled Meds:  enoxaparin (LOVENOX) injection  40 mg Subcutaneous Q24H   pantoprazole (PROTONIX) IV  40 mg Intravenous QHS   Continuous Infusions:  lactated ringers     lactated ringers     PRN Meds:.diphenhydrAMINE **OR** diphenhydrAMINE, metoprolol tartrate, morphine injection, ondansetron **OR** ondansetron (ZOFRAN) IV, simethicone  Current Facility-Administered Medications  Medication Dose Route Frequency Provider Last Rate Last Admin   diphenhydrAMINE (BENADRYL) 12.5 MG/5ML elixir 12.5 mg  12.5 mg Oral Q6H PRN [name], MD       Or   diphenhydrAMINE (BENADRYL) injection 12.5 mg  12.5 mg Intravenous Q6H PRN [name], MD       enoxaparin (LOVENOX) injection 40 mg  40 mg Subcutaneous Q24H [name], MD       lactated ringers infusion   Intravenous Continuous Godfrey Pick, MD       lactated ringers infusion   Intravenous Continuous [name], MD       metoprolol tartrate (LOPRESSOR) injection 5 mg  5 mg Intravenous Q6H PRN [name], MD  morphine (PF) 2 MG/ML injection 2 mg  2 mg Intravenous Q3H PRN [name], MD       ondansetron (ZOFRAN-ODT) disintegrating tablet 4 mg  4 mg Oral Q6H PRN [name], MD       Or   ondansetron [facility]) injection 4 mg  4 mg Intravenous Q6H PRN [name], MD       pantoprazole (PROTONIX) injection 40 mg  40 mg Intravenous QHS [name], MD       simethicone [facility]) chewable tablet 40 mg  40 mg Oral Q6H PRN [name], MD       Current Outpatient Medications  Medication Sig Dispense Refill Last Dose   aspirin 81 MG EC tablet Take 81 mg by mouth daily.      Multiple Vitamin (MULTI  VITAMIN) TABS Take 1 tablet by mouth daily.      niacin 500 MG tablet Take 500 mg by mouth daily.      Omega-3 Fatty Acids (FISH OIL) 1000 MG CAPS Take 1,000 mg by mouth daily.      omeprazole (PRILOSEC) 40 MG capsule Take 1 capsule (40 mg total) by mouth daily. 30 capsule 11     No Known Allergies   ROS: weight loss  A comprehensive review of systems was negative except for: Gastrointestinal: positive for diarrhea and bloating  Blood pressure 127/77, pulse 84, temperature 98.1 F (36.7 C), temperature source Oral, resp. rate 19, height '5\' 11"'$  (1.803 m), weight 75.2 kg, SpO2 99 %. Physical Exam Vitals reviewed.  Constitutional:      Appearance: He is well-developed.  HENT:     Head: Normocephalic.  Eyes:     Extraocular Movements: Extraocular movements intact.  Cardiovascular:     Rate and Rhythm: Normal rate.  Pulmonary:     Effort: Pulmonary effort is normal.  Abdominal:     General: Abdomen is protuberant. There is distension.     Palpations: Abdomen is soft.     Tenderness: There is abdominal tenderness.  Musculoskeletal:     Comments: Moves all extremities   Skin:    General: Skin is warm.  Neurological:     General: No focal deficit present.     Mental Status: He is alert and oriented to person, place, and time.  Psychiatric:        Mood and Affect: Mood normal.        Behavior: Behavior normal.     Results:Personally reviewed Ct abd and reviewed with Dr. Thornton Papas and Abbey Chatters; showed patient and wife- distended loops, small bowel capsule proximal and point of transition concerning for mass that is circumferential, lymph nodes enlarged  No results found for this or any previous visit (from the past 48 hour(s)).  CT Maxillofacial LTD WO CM  Result Date: 09/19/2022 CLINICAL DATA:  Periapical abscess with sinus K04.6 (ICD-10-CM). Abscess, furuncle and carbuncle of nose J34.0 (ICD-10-CM). EXAM: CT PARANASAL SINUS LIMITED WITHOUT CONTRAST TECHNIQUE: Non-contiguous  multidetector CT images of the paranasal sinuses were obtained in a single plane without contrast. RADIATION DOSE REDUCTION: This exam was performed according to the departmental dose-optimization program which includes automated exposure control, adjustment of the mA and/or kV according to patient size and/or use of iterative reconstruction technique. COMPARISON:  None Available. FINDINGS: Normally aerated paranasal sinuses. Patent nasal cavity. The nasal septum is deviated to the left with associated bony spur. The mastoids are well aerated. No gross soft tissue abnormality in this limited scan. IMPRESSION: 1. Normally  aerated paranasal sinuses. 2. The nasal septum is deviated to the left with associated bony spur. Electronically Signed   By: Pedro Earls M.D.   On: 09/19/2022 10:05   CT Abdomen Pelvis W Contrast  Result Date: 09/18/2022 CLINICAL DATA:  Evaluate for capsule. EXAM: CT ABDOMEN AND PELVIS WITH CONTRAST TECHNIQUE: Multidetector CT imaging of the abdomen and pelvis was performed using the standard protocol following bolus administration of intravenous contrast. RADIATION DOSE REDUCTION: This exam was performed according to the departmental dose-optimization program which includes automated exposure control, adjustment of the mA and/or kV according to patient size and/or use of iterative reconstruction technique. CONTRAST:  172m OMNIPAQUE IOHEXOL 300 MG/ML  SOLN COMPARISON:  CT abdomen and pelvis 08/29/2022. Abdominal x-ray 09/11/2022. FINDINGS: Lower chest: There is some tree-in-bud opacities in the right lower lobe, likely infectious/inflammatory, unchanged. Hepatobiliary: No focal liver abnormality is seen. No gallstones, gallbladder wall thickening, or biliary dilatation. Pancreas: Unremarkable. No pancreatic ductal dilatation or surrounding inflammatory changes. Spleen: Normal in size without focal abnormality. Adrenals/Urinary Tract: Bladder is under distended. There is no  hydronephrosis or perinephric fluid. Bilateral renal cysts are again seen. There is a complex cyst in the right kidney with thin septations measuring 4.9 cm, unchanged from prior. The adrenal glands are within normal limits. Stomach/Bowel: Again seen are multiple fluid-filled dilated small bowel loops with air-fluid levels throughout the abdomen and pelvis. Distal dilated small bowel loops demonstrate mild diffuse wall thickening and measure up to 5.5 cm in diameter. Degree of distention has increased compared to the prior examination. There is transition point in the right lower quadrant. Distal small bowel loops are decompressed. Terminal ileum is decompressed. There is no pneumatosis or free air. No significant mesenteric edema. Retained camera capsule identified within small bowel in the left mid abdomen. Stomach is moderately distended with fluid. Appendix is within normal limits. There is colonic diverticulosis. Vascular/Lymphatic: There are atherosclerotic calcifications of the aorta. There are stable enlarged central mesenteric lymph nodes measuring up to 1.6 x 3.3 cm image 2/51. aorta and IVC are normal in size. Reproductive: Prostate gland is within normal limits. Other: There is a small amount of free fluid in the lower abdomen and pelvis similar to prior. No focal abdominal wall hernia. Musculoskeletal: No acute or significant osseous findings. IMPRESSION: 1. Findings compatible with distal small bowel obstruction with transition point in the right lower quadrant. Degree of small bowel distention has increased when compared to the prior study. There is mild diffuse wall thickening of the dilated small bowel loops worrisome for enteritis. 2. Camera capsule identified in left-sided small bowel. 3. Stable small amount of free fluid in the lower abdomen and pelvis. 4. Stable central mesenteric lymphadenopathy. 5. Stable complex right renal cyst. 6. Stable tree-in-bud opacities in the right lower lobe, likely  infectious/inflammatory. Aortic Atherosclerosis (ICD10-I70.0). Electronically Signed   By: ARonney AstersM.D.   On: 09/18/2022 22:35   CT CHEST HIGH RESOLUTION  Result Date: 09/18/2022 CLINICAL DATA:  Chronic cough. EXAM: CT CHEST WITHOUT CONTRAST TECHNIQUE: Multidetector CT imaging of the chest was performed following the standard protocol without intravenous contrast. High resolution imaging of the lungs, as well as inspiratory and expiratory imaging, was performed. RADIATION DOSE REDUCTION: This exam was performed according to the departmental dose-optimization program which includes automated exposure control, adjustment of the mA and/or kV according to patient size and/or use of iterative reconstruction technique. COMPARISON:  None Available. FINDINGS: Cardiovascular: Normal heart size. No significant pericardial effusion/thickening. Three-vessel  coronary atherosclerosis. Atherosclerotic nonaneurysmal thoracic aorta. Normal caliber pulmonary arteries. Mediastinum/Nodes: No significant thyroid nodules. Unremarkable esophagus. No pathologically enlarged axillary, mediastinal or hilar lymph nodes, noting limited sensitivity for the detection of hilar adenopathy on this noncontrast study. Lungs/Pleura: No pneumothorax. No pleural effusion. Mild paraseptal and centrilobular emphysema with mild diffuse bronchial wall thickening. No acute consolidative airspace disease or lung masses. Moderate patchy tree-in-bud opacity and indistinct centrilobular nodularity in the right lower lobe with associated mild cylindrical bronchiectasis. Dominant 0.7 cm posteromedial right lower lobe pulmonary nodule (series 5/image 119). Thin parenchymal bands at the dependent lung bases bilaterally. No significant regions of subpleural reticulation, architectural distortion or frank honeycombing. No significant lobular air trapping or evidence of tracheobronchomalacia on the expiration sequence. Upper abdomen: Partially visualized 2.9  cm simple anterior upper right renal cyst, for which no follow-up imaging is recommended. Musculoskeletal: No aggressive appearing focal osseous lesions. Moderate thoracic spondylosis. IMPRESSION: 1. Moderate patchy tree-in-bud opacity and indistinct centrilobular nodularity in the right lower lobe with associated mild cylindrical bronchiectasis. 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 2. Dominant 0.7 cm posteromedial right lower lobe pulmonary nodule. Non-contrast chest CT at 3-6 months is recommended. If the nodules are stable at time of repeat CT, then future CT at 18-24 months (from today's scan) is considered optional for low-risk patients, but is recommended for high-risk patients. This recommendation follows the consensus statement: Guidelines for Management of Incidental Pulmonary Nodules Detected on CT Images: From the Fleischner Society 2017; Radiology 2017; 284:228-243. 3. Three-vessel coronary atherosclerosis. 4. Aortic Atherosclerosis (ICD10-I70.0) and Emphysema (ICD10-J43.9). Electronically Signed   By: Ilona Sorrel M.D.   On: 09/18/2022 10:03     Assessment & Plan:  Nicholas Dominguez is a 71 y.o. male with a SBO and concern or mass. He also has a capsule that will need to be removed. NG and NPO today. Discussed exploration tomorrow risk of bleeding, infection, need for resection, anastomotic leak, finding cancer.  PRN for pain IS, OOB HD ok NPO,  NG LR @ 75 Labs in AM EKG in ED SCDs, lovenox  Consented for procedure    All questions were answered to the satisfaction of the patient and family.   [name] 09/19/2022, 11:23 AM

## 2022-09-19 NOTE — Telephone Encounter (Signed)
noted 

## 2022-09-19 NOTE — ED Provider Notes (Signed)
[facility] EMERGENCY DEPARTMENT Provider Note   CSN: [id number] Arrival date & time: 09/19/22  1031     History  Chief Complaint  Patient presents with   Abdominal Pain    Nicholas Dominguez is a 71 y.o. male.  HPI Patient presents for concern of small bowel obstruction.  Medical history includes GERD, tobacco use, chronic cough.  He was seen in the ED 3 weeks ago for lower abdominal pain.  He was diagnosed with enteritis.  He followed up with gastroenterologist, Dr. Abbey Chatters.  He underwent capsule endoscopy on 11/6.  He was not able to pass the capsule.  On repeat CT imaging yesterday, he does appear to have a small bowel obstruction.  He was sent to the ED by Dr. Abbey Chatters for further evaluation.  Currently he reports abdominal distention with mild discomfort.  He denies any recent vomiting.  He was last able to eat yesterday midday.  He did not eat at all the day prior.  He has had loss of appetite which has been an ongoing symptom over the past several months.    Home Medications Prior to Admission medications   Medication Sig Start Date End Date Taking? Authorizing Provider  aspirin 81 MG EC tablet Take 81 mg by mouth daily.    [provider]  Multiple Vitamin (MULTI VITAMIN) TABS Take 1 tablet by mouth daily.    [provider]  niacin 500 MG tablet Take 500 mg by mouth daily.    [provider]  Omega-3 Fatty Acids (FISH OIL) 1000 MG CAPS Take 1,000 mg by mouth daily.    [provider]  omeprazole (PRILOSEC) 40 MG capsule Take 1 capsule (40 mg total) by mouth daily. 04/06/22 04/06/23  Eloise Harman, DO      Allergies    Patient has no known allergies.    Review of Systems   Review of Systems  Constitutional:  Positive for appetite change and unexpected weight change.  Gastrointestinal:  Positive for abdominal distention and abdominal pain.  All other systems reviewed and are negative.   Physical Exam Updated Vital Signs BP 134/81 (BP  Location: Right Arm)   Pulse 84   Temp 97.9 F (36.6 C) (Oral)   Resp 20   Ht '5\' 11"'$  (1.803 m)   Wt 75.2 kg   SpO2 99%   BMI 23.12 kg/m  Physical Exam Vitals and nursing note reviewed.  Constitutional:      General: He is not in acute distress.    Appearance: Normal appearance. He is well-developed. He is not ill-appearing, toxic-appearing or diaphoretic.  HENT:     Head: Normocephalic and atraumatic.     Right Ear: External ear normal.     Left Ear: External ear normal.     Nose: Nose normal.     Mouth/Throat:     Mouth: Mucous membranes are moist.     Pharynx: Oropharynx is clear.  Eyes:     Extraocular Movements: Extraocular movements intact.     Conjunctiva/sclera: Conjunctivae normal.  Cardiovascular:     Rate and Rhythm: Normal rate and regular rhythm.  Pulmonary:     Effort: Pulmonary effort is normal. No respiratory distress.  Abdominal:     General: There is distension.     Palpations: Abdomen is soft.     Tenderness: There is abdominal tenderness. There is no guarding or rebound.  Musculoskeletal:        General: No swelling. Normal range of motion.  Cervical back: Normal range of motion and neck supple.     Right lower leg: No edema.     Left lower leg: No edema.  Skin:    General: Skin is warm and dry.     Capillary Refill: Capillary refill takes less than 2 seconds.     Coloration: Skin is not jaundiced or pale.  Neurological:     General: No focal deficit present.     Mental Status: He is alert and oriented to person, place, and time.     Cranial Nerves: No cranial nerve deficit.     Sensory: No sensory deficit.     Motor: No weakness.     Coordination: Coordination normal.     Gait: Gait normal.  Psychiatric:        Mood and Affect: Mood normal.        Behavior: Behavior normal.        Thought Content: Thought content normal.        Judgment: Judgment normal.     ED Results / Procedures / Treatments   Labs (all labs ordered are listed,  but only abnormal results are displayed) Labs Reviewed  COMPREHENSIVE METABOLIC PANEL  LIPASE, BLOOD  CBC WITH DIFFERENTIAL/PLATELET  URINALYSIS, ROUTINE W REFLEX MICROSCOPIC    EKG None  Radiology CT Maxillofacial LTD WO CM  Result Date: 09/19/2022 CLINICAL DATA:  Periapical abscess with sinus K04.6 (ICD-10-CM). Abscess, furuncle and carbuncle of nose J34.0 (ICD-10-CM). EXAM: CT PARANASAL SINUS LIMITED WITHOUT CONTRAST TECHNIQUE: Non-contiguous multidetector CT images of the paranasal sinuses were obtained in a single plane without contrast. RADIATION DOSE REDUCTION: This exam was performed according to the departmental dose-optimization program which includes automated exposure control, adjustment of the mA and/or kV according to patient size and/or use of iterative reconstruction technique. COMPARISON:  None Available. FINDINGS: Normally aerated paranasal sinuses. Patent nasal cavity. The nasal septum is deviated to the left with associated bony spur. The mastoids are well aerated. No gross soft tissue abnormality in this limited scan. IMPRESSION: 1. Normally aerated paranasal sinuses. 2. The nasal septum is deviated to the left with associated bony spur. Electronically Signed   By: Pedro Earls M.D.   On: 09/19/2022 10:05   CT Abdomen Pelvis W Contrast  Result Date: 09/18/2022 CLINICAL DATA:  Evaluate for capsule. EXAM: CT ABDOMEN AND PELVIS WITH CONTRAST TECHNIQUE: Multidetector CT imaging of the abdomen and pelvis was performed using the standard protocol following bolus administration of intravenous contrast. RADIATION DOSE REDUCTION: This exam was performed according to the departmental dose-optimization program which includes automated exposure control, adjustment of the mA and/or kV according to patient size and/or use of iterative reconstruction technique. CONTRAST:  179m OMNIPAQUE IOHEXOL 300 MG/ML  SOLN COMPARISON:  CT abdomen and pelvis 08/29/2022. Abdominal x-ray  09/11/2022. FINDINGS: Lower chest: There is some tree-in-bud opacities in the right lower lobe, likely infectious/inflammatory, unchanged. Hepatobiliary: No focal liver abnormality is seen. No gallstones, gallbladder wall thickening, or biliary dilatation. Pancreas: Unremarkable. No pancreatic ductal dilatation or surrounding inflammatory changes. Spleen: Normal in size without focal abnormality. Adrenals/Urinary Tract: Bladder is under distended. There is no hydronephrosis or perinephric fluid. Bilateral renal cysts are again seen. There is a complex cyst in the right kidney with thin septations measuring 4.9 cm, unchanged from prior. The adrenal glands are within normal limits. Stomach/Bowel: Again seen are multiple fluid-filled dilated small bowel loops with air-fluid levels throughout the abdomen and pelvis. Distal dilated small bowel loops demonstrate mild diffuse wall  thickening and measure up to 5.5 cm in diameter. Degree of distention has increased compared to the prior examination. There is transition point in the right lower quadrant. Distal small bowel loops are decompressed. Terminal ileum is decompressed. There is no pneumatosis or free air. No significant mesenteric edema. Retained camera capsule identified within small bowel in the left mid abdomen. Stomach is moderately distended with fluid. Appendix is within normal limits. There is colonic diverticulosis. Vascular/Lymphatic: There are atherosclerotic calcifications of the aorta. There are stable enlarged central mesenteric lymph nodes measuring up to 1.6 x 3.3 cm image 2/51. aorta and IVC are normal in size. Reproductive: Prostate gland is within normal limits. Other: There is a small amount of free fluid in the lower abdomen and pelvis similar to prior. No focal abdominal wall hernia. Musculoskeletal: No acute or significant osseous findings. IMPRESSION: 1. Findings compatible with distal small bowel obstruction with transition point in the right  lower quadrant. Degree of small bowel distention has increased when compared to the prior study. There is mild diffuse wall thickening of the dilated small bowel loops worrisome for enteritis. 2. Camera capsule identified in left-sided small bowel. 3. Stable small amount of free fluid in the lower abdomen and pelvis. 4. Stable central mesenteric lymphadenopathy. 5. Stable complex right renal cyst. 6. Stable tree-in-bud opacities in the right lower lobe, likely infectious/inflammatory. Aortic Atherosclerosis (ICD10-I70.0). Electronically Signed   By: Ronney Asters M.D.   On: 09/18/2022 22:35   CT CHEST HIGH RESOLUTION  Result Date: 09/18/2022 CLINICAL DATA:  Chronic cough. EXAM: CT CHEST WITHOUT CONTRAST TECHNIQUE: Multidetector CT imaging of the chest was performed following the standard protocol without intravenous contrast. High resolution imaging of the lungs, as well as inspiratory and expiratory imaging, was performed. RADIATION DOSE REDUCTION: This exam was performed according to the departmental dose-optimization program which includes automated exposure control, adjustment of the mA and/or kV according to patient size and/or use of iterative reconstruction technique. COMPARISON:  None Available. FINDINGS: Cardiovascular: Normal heart size. No significant pericardial effusion/thickening. Three-vessel coronary atherosclerosis. Atherosclerotic nonaneurysmal thoracic aorta. Normal caliber pulmonary arteries. Mediastinum/Nodes: No significant thyroid nodules. Unremarkable esophagus. No pathologically enlarged axillary, mediastinal or hilar lymph nodes, noting limited sensitivity for the detection of hilar adenopathy on this noncontrast study. Lungs/Pleura: No pneumothorax. No pleural effusion. Mild paraseptal and centrilobular emphysema with mild diffuse bronchial wall thickening. No acute consolidative airspace disease or lung masses. Moderate patchy tree-in-bud opacity and indistinct centrilobular  nodularity in the right lower lobe with associated mild cylindrical bronchiectasis. Dominant 0.7 cm posteromedial right lower lobe pulmonary nodule (series 5/image 119). Thin parenchymal bands at the dependent lung bases bilaterally. No significant regions of subpleural reticulation, architectural distortion or frank honeycombing. No significant lobular air trapping or evidence of tracheobronchomalacia on the expiration sequence. Upper abdomen: Partially visualized 2.9 cm simple anterior upper right renal cyst, for which no follow-up imaging is recommended. Musculoskeletal: No aggressive appearing focal osseous lesions. Moderate thoracic spondylosis. IMPRESSION: 1. Moderate patchy tree-in-bud opacity and indistinct centrilobular nodularity in the right lower lobe with associated mild cylindrical bronchiectasis. 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 2. Dominant 0.7 cm posteromedial right lower lobe pulmonary nodule. Non-contrast chest CT at 3-6 months is recommended. If the nodules are stable at time of repeat CT, then future CT at 18-24 months (from today's scan) is considered optional for low-risk patients, but is recommended for high-risk patients. This  recommendation follows the consensus statement: Guidelines for Management of Incidental Pulmonary Nodules Detected on CT Images: From the Fleischner Society 2017; Radiology 2017; 284:228-243. 3. Three-vessel coronary atherosclerosis. 4. Aortic Atherosclerosis (ICD10-I70.0) and Emphysema (ICD10-J43.9). Electronically Signed   By: Ilona Sorrel M.D.   On: 09/18/2022 10:03    Procedures Procedures    Medications Ordered in ED Medications  lactated ringers infusion (has no administration in time range)    ED Course/ Medical Decision Making/ A&P                           Medical Decision Making  This  patient presents to the ED for concern of abdominal discomfort, this involves an extensive number of treatment options, and is a complaint that carries with it a high risk of complications and morbidity.  The differential diagnosis includes worsening enteritis, small bowel obstruction, perforated viscus, neoplasm   Co morbidities that complicate the patient evaluation  GERD, tobacco use, chronic cough   Additional history obtained:  Additional history obtained from patient's wife External records from outside source obtained and reviewed including EMR   Lab Tests:  I Ordered, and personally interpreted labs.  The pertinent results include: (Lab work pending at time of admission)   Imaging Studies ordered:  I independently visualized and interpreted imaging from yesterday which showed distal small bowel obstruction with transition point in right lower quadrant; mild diffuse wall thickening and dilated small bowel loops consistent with enteritis; camera capsule and left side small bowel; unchanged small amount of free fluid in lower abdomen and pelvis; unchanged central mesenteric lymphadenopathy; unchanged tree-in-bud opacities in right lower lobe I agree with the radiologist interpretation   Cardiac Monitoring: / EKG:  The patient was maintained on a cardiac monitor.  I personally viewed and interpreted the cardiac monitored which showed an underlying rhythm of: Sinus rhythm   Consultations Obtained:  I requested consultation with the general surgeon, Dr. Constance Haw,  and discussed lab and imaging findings as well as pertinent plan - they recommend: Admission to surgery service   Problem List / ED Course / Critical interventions / Medication management  Patient is a 71 year old male presenting for ongoing abdominal discomfort.  He underwent a CT scan in the ED 3 weeks ago which showed enteritis.  He followed up with gastroenterology and underwent capsule endoscopy.  Capsule was  unable to be passed.  He underwent repeat CT imaging yesterday which showed distal small bowel obstruction.  Patient does endorse recent worsening abdominal distention and discomfort.  He has had ongoing loss of appetite.  He has not had any vomiting.  Patient is overall well-appearing on arrival in the ED.  Vital signs are reassuring.  He does have abdominal distention on exam but abdomen is soft and he endorses only mild tenderness.  Laboratory work-up was initiated in preparation for admission.  Patient was agreeable to placement of NG tube.  I spoke with general surgeon on-call, Dr. Constance Haw, who will admit to her service.  Patient was admitted for further management. I ordered medication including IV fluids for hydration Reevaluation of the patient after these medicines showed that the patient stayed the same I have reviewed the patients home medicines and have made adjustments as needed   Social Determinants of Health:  Has access to outpatient care         Final Clinical Impression(s) / ED Diagnoses Final diagnoses:  Small bowel obstruction ([place])    Rx / DC  Orders ED Discharge Orders     None         Godfrey Pick, MD 09/19/22 [phone]

## 2022-09-19 NOTE — Telephone Encounter (Signed)
Personally reviewed CT scan.  Also discussed case with Dr. Arnoldo Morale who also reviewed CT scan.  Patient with worsening dilation of the small bowel with transition point.  Discussed this in depth with patient's wife Neoma Laming.  Capsule appears to still be in his bowel as well.  Patient to go to ER for further evaluation.  At this point may need exploratory laparotomy to further investigate his small bowel as well as for capsule removal.  All questions concerns answered.  I called ER physician and explained situation.  I have also reached out to Dr. Constance Haw who is covering for the surgery service this week.

## 2022-09-20 ENCOUNTER — Inpatient Hospital Stay (HOSPITAL_COMMUNITY): Payer: Medicare Other | Admitting: Certified Registered Nurse Anesthetist

## 2022-09-20 ENCOUNTER — Encounter (HOSPITAL_COMMUNITY): Payer: Self-pay | Admitting: General Surgery

## 2022-09-20 ENCOUNTER — Other Ambulatory Visit: Payer: Self-pay

## 2022-09-20 ENCOUNTER — Encounter (HOSPITAL_COMMUNITY)
Admission: EM | Disposition: A | Discharge status: Home / Self Care | Payer: Self-pay | Source: Home / Self Care | Attending: General Surgery

## 2022-09-20 DIAGNOSIS — K56609 Unspecified intestinal obstruction, unspecified as to partial versus complete obstruction: Secondary | ICD-10-CM

## 2022-09-20 DIAGNOSIS — K56699 Other intestinal obstruction unspecified as to partial versus complete obstruction: Secondary | ICD-10-CM

## 2022-09-20 HISTORY — PX: BOWEL RESECTION: SHX1257

## 2022-09-20 HISTORY — PX: LAPAROTOMY: SHX154

## 2022-09-20 LAB — BASIC METABOLIC PANEL
Anion gap: 6 (ref 5–15)
BUN: 25 mg/dL — ABNORMAL HIGH (ref 8–23)
CO2: 24 mmol/L (ref 22–32)
Calcium: 8.6 mg/dL — ABNORMAL LOW (ref 8.9–10.3)
Chloride: 106 mmol/L (ref 98–111)
Creatinine, Ser: 0.89 mg/dL (ref 0.61–1.24)
GFR, Estimated: >60 mL/min (ref >60)
Glucose, Bld: 85 mg/dL (ref 70–99)
Potassium: 3.4 mmol/L — ABNORMAL LOW (ref 3.5–5.1)
Sodium: 136 mmol/L (ref 135–145)

## 2022-09-20 LAB — CBC
HCT: 33.8 % — ABNORMAL LOW (ref 39.0–52.0)
Hemoglobin: 11.4 g/dL — ABNORMAL LOW (ref 13.0–17.0)
MCH: 31.9 pg (ref 26.0–34.0)
MCHC: 33.7 g/dL (ref 30.0–36.0)
MCV: 94.7 fL (ref 80.0–100.0)
Platelets: 347 10*3/uL (ref 150–400)
RBC: 3.57 MIL/uL — ABNORMAL LOW (ref 4.22–5.81)
RDW: 13.2 % (ref 11.5–15.5)
WBC: 10.9 10*3/uL — ABNORMAL HIGH (ref 4.0–10.5)
nRBC: 0 % (ref 0.0–0.2)

## 2022-09-20 LAB — MAGNESIUM: Magnesium: 1.7 mg/dL (ref 1.7–2.4)

## 2022-09-20 LAB — TYPE AND SCREEN
ABO/RH(D): A POS
Antibody Screen: NEGATIVE

## 2022-09-20 LAB — PHOSPHORUS: Phosphorus: 3.7 mg/dL (ref 2.5–4.6)

## 2022-09-20 LAB — ABO/RH: ABO/RH(D): A POS

## 2022-09-20 SURGERY — LAPAROTOMY, EXPLORATORY
Anesthesia: General | Site: Abdomen

## 2022-09-20 MED ORDER — ROCURONIUM BROMIDE 10 MG/ML (PF) SYRINGE
PREFILLED_SYRINGE | INTRAVENOUS | Status: DC | PRN
  Administered 2022-09-20: 10 mg via INTRAVENOUS
  Administered 2022-09-20: 50 mg via INTRAVENOUS

## 2022-09-20 MED ORDER — ROCURONIUM BROMIDE 10 MG/ML (PF) SYRINGE
PREFILLED_SYRINGE | INTRAVENOUS | Status: AC
  Filled 2022-09-20: qty 10

## 2022-09-20 MED ORDER — LABETALOL HCL 5 MG/ML IV SOLN
INTRAVENOUS | Status: DC | PRN
  Administered 2022-09-20 (×2): 5 mg via INTRAVENOUS

## 2022-09-20 MED ORDER — MIDAZOLAM HCL 2 MG/2ML IJ SOLN
INTRAMUSCULAR | Status: DC | PRN
  Administered 2022-09-20: 1 mg via INTRAVENOUS

## 2022-09-20 MED ORDER — LIDOCAINE HCL (PF) 2 % IJ SOLN
INTRAMUSCULAR | Status: AC
  Filled 2022-09-20: qty 5

## 2022-09-20 MED ORDER — OXYCODONE HCL 5 MG PO TABS: 5.0000 mg | ORAL_TABLET | Freq: Once | ORAL | Status: DC | PRN

## 2022-09-20 MED ORDER — PROPOFOL 10 MG/ML IV BOLUS
INTRAVENOUS | Status: AC
  Filled 2022-09-20: qty 20

## 2022-09-20 MED ORDER — OXYCODONE HCL 5 MG/5ML PO SOLN: 5.0000 mg | Freq: Once | ORAL | Status: DC | PRN

## 2022-09-20 MED ORDER — CHLORHEXIDINE GLUCONATE 0.12 % MT SOLN: 15.0000 mL | Freq: Once | OROMUCOSAL | Status: DC

## 2022-09-20 MED ORDER — DEXMEDETOMIDINE HCL IN NACL 80 MCG/20ML IV SOLN
INTRAVENOUS | Status: DC | PRN
  Administered 2022-09-20: 20 ug via BUCCAL

## 2022-09-20 MED ORDER — SUCCINYLCHOLINE CHLORIDE 200 MG/10ML IV SOSY
PREFILLED_SYRINGE | INTRAVENOUS | Status: AC
  Filled 2022-09-20: qty 10

## 2022-09-20 MED ORDER — LACTATED RINGERS IV SOLN: INTRAVENOUS | Status: DC

## 2022-09-20 MED ORDER — ORAL CARE MOUTH RINSE: 15.0000 mL | Freq: Once | OROMUCOSAL | Status: DC

## 2022-09-20 MED ORDER — BUPIVACAINE LIPOSOME 1.3 % IJ SUSP
INTRAMUSCULAR | Status: AC
  Filled 2022-09-20: qty 20

## 2022-09-20 MED ORDER — MIDAZOLAM HCL 2 MG/2ML IJ SOLN
INTRAMUSCULAR | Status: AC
  Filled 2022-09-20: qty 2

## 2022-09-20 MED ORDER — SODIUM CHLORIDE 0.9 % IV SOLN
INTRAVENOUS | Status: DC | PRN
  Administered 2022-09-20: 2 g via INTRAVENOUS

## 2022-09-20 MED ORDER — DEXAMETHASONE SODIUM PHOSPHATE 10 MG/ML IJ SOLN
INTRAMUSCULAR | Status: DC | PRN
  Administered 2022-09-20: 10 mg via INTRAVENOUS

## 2022-09-20 MED ORDER — BUPIVACAINE LIPOSOME 1.3 % IJ SUSP
INTRAMUSCULAR | Status: DC | PRN
  Administered 2022-09-20: 20 mL

## 2022-09-20 MED ORDER — SODIUM CHLORIDE 0.9 % IV SOLN
INTRAVENOUS | Status: AC
  Filled 2022-09-20: qty 2

## 2022-09-20 MED ORDER — SUGAMMADEX SODIUM 200 MG/2ML IV SOLN
INTRAVENOUS | Status: DC | PRN
  Administered 2022-09-20: 200 mg via INTRAVENOUS

## 2022-09-20 MED ORDER — SUCCINYLCHOLINE CHLORIDE 200 MG/10ML IV SOSY
PREFILLED_SYRINGE | INTRAVENOUS | Status: DC | PRN
  Administered 2022-09-20: 120 mg via INTRAVENOUS

## 2022-09-20 MED ORDER — SODIUM CHLORIDE 0.9 % IR SOLN
Status: DC | PRN
  Administered 2022-09-20 (×2): 1000 mL

## 2022-09-20 MED ORDER — EPHEDRINE SULFATE (PRESSORS) 50 MG/ML IJ SOLN
INTRAMUSCULAR | Status: DC | PRN
  Administered 2022-09-20: 5 mg via INTRAVENOUS

## 2022-09-20 MED ORDER — ONDANSETRON HCL 4 MG/2ML IJ SOLN
INTRAMUSCULAR | Status: DC | PRN
  Administered 2022-09-20: 4 mg via INTRAVENOUS

## 2022-09-20 MED ORDER — HYDROMORPHONE HCL 1 MG/ML IJ SOLN
INTRAMUSCULAR | Status: AC
  Filled 2022-09-20: qty 0.5

## 2022-09-20 MED ORDER — PHENYLEPHRINE HCL-NACL 20-0.9 MG/250ML-% IV SOLN
INTRAVENOUS | Status: AC
  Filled 2022-09-20: qty 250

## 2022-09-20 MED ORDER — FENTANYL CITRATE PF 50 MCG/ML IJ SOSY
25.0000 ug | PREFILLED_SYRINGE | INTRAMUSCULAR | Status: DC | PRN
  Administered 2022-09-20 (×3): 50 ug via INTRAVENOUS
  Filled 2022-09-20 (×3): qty 1

## 2022-09-20 MED ORDER — HYDROMORPHONE HCL 1 MG/ML IJ SOLN
0.5000 mg | Freq: Once | INTRAMUSCULAR | Status: AC
  Administered 2022-09-20: 0.5 mg via INTRAVENOUS

## 2022-09-20 MED ORDER — ONDANSETRON HCL 4 MG/2ML IJ SOLN: 4.0000 mg | Freq: Once | INTRAMUSCULAR | Status: DC | PRN

## 2022-09-20 MED ORDER — PHENYLEPHRINE 80 MCG/ML (10ML) SYRINGE FOR IV PUSH (FOR BLOOD PRESSURE SUPPORT)
PREFILLED_SYRINGE | INTRAVENOUS | Status: AC
  Filled 2022-09-20: qty 30

## 2022-09-20 MED ORDER — FENTANYL CITRATE (PF) 100 MCG/2ML IJ SOLN
INTRAMUSCULAR | Status: DC | PRN
  Administered 2022-09-20: 100 ug via INTRAVENOUS
  Administered 2022-09-20: 50 ug via INTRAVENOUS

## 2022-09-20 MED ORDER — PROPOFOL 10 MG/ML IV BOLUS
INTRAVENOUS | Status: DC | PRN
  Administered 2022-09-20: 120 mg via INTRAVENOUS

## 2022-09-20 MED ORDER — CEFAZOLIN SODIUM-DEXTROSE 2-4 GM/100ML-% IV SOLN
INTRAVENOUS | Status: AC
  Filled 2022-09-20: qty 100

## 2022-09-20 MED ORDER — DEXAMETHASONE SODIUM PHOSPHATE 10 MG/ML IJ SOLN
INTRAMUSCULAR | Status: AC
  Filled 2022-09-20: qty 1

## 2022-09-20 MED ORDER — FENTANYL CITRATE (PF) 250 MCG/5ML IJ SOLN
INTRAMUSCULAR | Status: AC
  Filled 2022-09-20: qty 5

## 2022-09-20 MED ORDER — LIDOCAINE HCL (CARDIAC) PF 100 MG/5ML IV SOSY
PREFILLED_SYRINGE | INTRAVENOUS | Status: DC | PRN
  Administered 2022-09-20: 60 mg via INTRATRACHEAL

## 2022-09-20 MED ORDER — LACTATED RINGERS IV SOLN: INTRAVENOUS | Status: DC | PRN

## 2022-09-20 MED ORDER — ONDANSETRON HCL 4 MG/2ML IJ SOLN
INTRAMUSCULAR | Status: AC
  Filled 2022-09-20: qty 2

## 2022-09-20 SURGICAL SUPPLY — 39 items
CHLORAPREP W/TINT 26 (MISCELLANEOUS) ×2 IMPLANT
CLOTH BEACON ORANGE TIMEOUT ST (SAFETY) ×2 IMPLANT
COVER LIGHT HANDLE STERIS (MISCELLANEOUS) ×4 IMPLANT
DRAPE WARM FLUID 44X44 (DRAPES) ×2 IMPLANT
DRSG OPSITE POSTOP 4X8 (GAUZE/BANDAGES/DRESSINGS) IMPLANT
ELECT REM PT RETURN 9FT ADLT (ELECTROSURGICAL) ×2
ELECTRODE REM PT RTRN 9FT ADLT (ELECTROSURGICAL) ×2 IMPLANT
GLOVE BIO SURGEON STRL SZ 6.5 (GLOVE) ×2 IMPLANT
GLOVE BIO SURGEON STRL SZ7 (GLOVE) IMPLANT
GLOVE BIOGEL PI IND STRL 6.5 (GLOVE) ×2 IMPLANT
GLOVE BIOGEL PI IND STRL 7.0 (GLOVE) ×4 IMPLANT
GLOVE BIOGEL PI IND STRL 7.5 (GLOVE) IMPLANT
GOWN STRL REUS W/TWL LRG LVL3 (GOWN DISPOSABLE) ×6 IMPLANT
HANDLE SUCTION POOLE (INSTRUMENTS) ×2 IMPLANT
INST SET MAJOR GENERAL (KITS) ×2 IMPLANT
KIT TURNOVER KIT A (KITS) ×2 IMPLANT
MANIFOLD NEPTUNE II (INSTRUMENTS) ×2 IMPLANT
NDL HYPO 21X1.5 SAFETY (NEEDLE) ×2 IMPLANT
NEEDLE HYPO 21X1.5 SAFETY (NEEDLE) ×2 IMPLANT
NS IRRIG 1000ML POUR BTL (IV SOLUTION) ×4 IMPLANT
PACK MAJOR ABDOMINAL (CUSTOM PROCEDURE TRAY) ×2 IMPLANT
PAD ARMBOARD 7.5X6 YLW CONV (MISCELLANEOUS) ×2 IMPLANT
PENCIL SMOKE EVACUATOR COATED (MISCELLANEOUS) ×2 IMPLANT
RELOAD PROXIMATE 75MM BLUE (ENDOMECHANICALS) ×8 IMPLANT
RELOAD STAPLE 75 3.8 BLU REG (ENDOMECHANICALS) IMPLANT
RETRACTOR WOUND ALXS 18CM MED (MISCELLANEOUS) IMPLANT
RTRCTR WOUND ALEXIS O 18CM MED (MISCELLANEOUS) ×2
SET BASIN LINEN APH (SET/KITS/TRAYS/PACK) ×2 IMPLANT
SPONGE T-LAP 18X18 ~~LOC~~+RFID (SPONGE) ×2 IMPLANT
STAPLER GUN LINEAR PROX 60 (STAPLE) IMPLANT
STAPLER PROXIMATE 100MM BLUE (MISCELLANEOUS) IMPLANT
STAPLER PROXIMATE 75MM BLUE (STAPLE) IMPLANT
STAPLER VISISTAT (STAPLE) ×2 IMPLANT
SUCTION POOLE HANDLE (INSTRUMENTS) ×2
SUT CHROMIC 0 SH (SUTURE) IMPLANT
SUT PDS AB CT VIOLET #0 27IN (SUTURE) ×4 IMPLANT
SUT SILK 3 0 SH CR/8 (SUTURE) ×2 IMPLANT
SYR 20ML LL LF (SYRINGE) ×4 IMPLANT
SYR BULB IRRIG 60ML STRL (SYRINGE) IMPLANT

## 2022-09-20 NOTE — Anesthesia Preprocedure Evaluation (Signed)
Anesthesia Evaluation  Patient identified by MRN, date of birth, ID band Patient awake    Reviewed: Allergy & Precautions, H&P , NPO status , Patient's Chart, lab work & pertinent test results, reviewed documented beta blocker date and time   Airway Mallampati: II  TM Distance: >3 FB Neck ROM: full    Dental no notable dental hx.    Pulmonary neg pulmonary ROS, Current Smoker and Patient abstained from smoking.   Pulmonary exam normal breath sounds clear to auscultation       Cardiovascular Exercise Tolerance: Good negative cardio ROS  Rhythm:regular Rate:Normal     Neuro/Psych negative neurological ROS  negative psych ROS   GI/Hepatic negative GI ROS, Neg liver ROS,GERD  ,,  Endo/Other  negative endocrine ROS    Renal/GU negative Renal ROS  negative genitourinary   Musculoskeletal   Abdominal   Peds  Hematology negative hematology ROS (+)   Anesthesia Other Findings   Reproductive/Obstetrics negative OB ROS                             Anesthesia Physical Anesthesia Plan  ASA: 2  Anesthesia Plan: General and General ETT   Post-op Pain Management:    Induction:   PONV Risk Score and Plan: Ondansetron  Airway Management Planned:   Additional Equipment:   Intra-op Plan:   Post-operative Plan:   Informed Consent: I have reviewed the patients History and Physical, chart, labs and discussed the procedure including the risks, benefits and alternatives for the proposed anesthesia with the patient or authorized representative who has indicated his/her understanding and acceptance.     Dental Advisory Given  Plan Discussed with: CRNA  Anesthesia Plan Comments:        Anesthesia Quick Evaluation

## 2022-09-20 NOTE — TOC Progression Note (Signed)
  Transition of Care [facility]) Screening Note   Patient Details  Name: Nicholas Dominguez Date of Birth: [birth year]   Transition of Care [facility]) CM/SW Contact:    Boneta Lucks, RN Phone Number: 09/20/2022, 10:44 AM    Transition of Care Department [facility]) has reviewed patient and no TOC needs have been identified at this time. We will continue to monitor patient advancement through interdisciplinary progression rounds. If new patient transition needs arise, please place a TOC consult.      Barriers to Discharge: Continued Medical Work up   Social Determinants of Health (SDOH) Interventions Food Insecurity Interventions: Intervention Not Indicated Housing Interventions: Intervention Not Indicated Transportation Interventions: Intervention Not Indicated Utilities Interventions: Intervention Not Indicated

## 2022-09-20 NOTE — Care Management Important Message (Signed)
Important Message  Patient Details  Name: Nicholas Dominguez MRN: [id number] Date of Birth: 05/21/1951   Medicare Important Message Given:  N/A - LOS <3 / Initial given by admissions     Tommy Medal 09/20/2022, 1:24 PM

## 2022-09-20 NOTE — Transfer of Care (Signed)
Immediate Anesthesia Transfer of Care Note  Patient: Nicholas Dominguez  Procedure(s) Performed: EXPLORATORY LAPAROTOMY, removal of capsule (Abdomen) SMALL BOWEL RESECTION (Abdomen)  Patient Location: PACU  Anesthesia Type:General  Level of Consciousness: awake, alert , and patient cooperative  Airway & Oxygen Therapy: Patient Spontanous Breathing and Patient connected to nasal cannula oxygen  Post-op Assessment: Report given to RN and Post -op Vital signs reviewed and stable  Post vital signs: Reviewed and stable  Last Vitals:  Vitals Value Taken Time  BP 127/77 09/20/22 1042  Temp    Pulse 88 09/20/22 1044  Resp 21 09/20/22 1044  SpO2 95 % 09/20/22 1044  Vitals shown include unvalidated device data.  Last Pain:  Vitals:   09/20/22 0825  TempSrc:   PainSc: 0-No pain      Patients Stated Pain Goal: 0 (36/12/24 4975)  Complications: No notable events documented.

## 2022-09-20 NOTE — Interval H&P Note (Signed)
History and Physical Interval Note:  09/20/2022 7:54 AM  Nicholas Dominguez  has presented today for surgery, with the diagnosis of smal bowel obstruction, capsule in small bowel.  The various methods of treatment have been discussed with the patient and family. After consideration of risks, benefits and other options for treatment, the patient has consented to  Procedure(s): EXPLORATORY LAPAROTOMY, possible small bowel resection, lysis of adhesions, removal of capsule (N/A) as a surgical intervention.  The patient's history has been reviewed, patient examined, no change in status, stable for surgery.  I have reviewed the patient's chart and labs.  Questions were answered to the patient's satisfaction.     [name]

## 2022-09-20 NOTE — Progress Notes (Signed)
[facility]  Updated family. Small bowel mass/ stricture removed. Had to leave some nodes due to the proximity to SMA.   PRN for pain IS, OOB NPO, NG can have oral meds if hold suction LR @ 75 Labs in AM SCDs, lovenox  Will be a few days before any pathology back. Maybe even Monday.   Curlene Labrum, MD [facility] [address] [place], [place] [address] [phone] (office)

## 2022-09-20 NOTE — Progress Notes (Signed)
LVM on patient VM.

## 2022-09-20 NOTE — Anesthesia Procedure Notes (Signed)
Procedure Name: Intubation Date/Time: 09/20/2022 9:06 AM  Performed by: Karna Dupes, CRNAPre-anesthesia Checklist: Patient identified, Emergency Drugs available, Suction available and Patient being monitored Patient Re-evaluated:Patient Re-evaluated prior to induction Oxygen Delivery Method: Circle system utilized Preoxygenation: Pre-oxygenation with 100% oxygen Induction Type: IV induction Ventilation: Mask ventilation without difficulty Laryngoscope Size: Mac and 4 Grade View: Grade I Tube type: Oral Tube size: 7.5 mm Number of attempts: 1 Airway Equipment and Method: Stylet Placement Confirmation: ETT inserted through vocal cords under direct vision, positive ETCO2 and breath sounds checked- equal and bilateral Secured at: 22 cm Tube secured with: Tape Dental Injury: Teeth and Oropharynx as per pre-operative assessment

## 2022-09-20 NOTE — Op Note (Signed)
[facility] Operative Note  09/20/22  Preoperative Diagnosis: Small bowel obstruction, capsule retained    Postoperative Diagnosis: Same   Procedure(s) Performed: Exploratory laparotomy, small bowel resection, side to side anastomosis, retrieval of capsule    Surgeon: Lanell Matar. Constance Haw, MD   Assistants: No qualified resident was available    Anesthesia: General endotracheal   Anesthesiologist: Louann Sjogren, MD    Specimens:  Small bowel with strictured mass (proximal staple line opened to retrieve capsule); additional failed anastomosis    Estimated Blood Loss: Minimal   Blood Replacement: None    Complications: None   Wound Class: Clean contaminated    Operative Indications: Mr. Groesbeck is a 71 yo with a SBO and concern for area of stricture from a mass and a retained capsule from a GI capsule study. We discussed exploration, resection, risk of bleeding, infection, anastomotic leak, finding something like cancer and needing more treatment.   Findings: Strictured area circumferential with proximal bowel dilated to 6-8cm, distal bowel normal, abrupt transition, capsule retrieved, large lymph nodes in mesentery, unable to resect all due to proximity to SMA    Procedure: The patient was taken to the operating room and placed supine. General endotracheal anesthesia was induced. Intravenous antibiotics were administered per protocol.  A nasogastric tube was already positioned to decompress the stomach. The abdomen was prepared and draped in the usual sterile fashion.   A midline incision was made and carried down to the fascia and the fascia was entered with care. A wound protector was placed. The small bowel was eviscerated and ran proximal to distal. In the ileum there was an abrupt transition from dilated bowel 6-8cm that was fluid filled to decompressed normal bowel and adjacent large lymph nodes tracking down to the SMA.  The capsule was felt and milked down to  the point of transection proximal. Using a 100 mm linear cutting staple load the proximal point of transection was determined and using a 75 mm linear cutting stapler the distal point was determined. The mesentery was taken with a Ligasure.   The small bowel was opened on the proximal staple line and the capsule was confirmed to be retrieved.  From here an anastomosis was done in the standard side to side fashion using a linear cutting 75 mm stapler and a TA 60 stapler.  Unfortunately due to the size of the colon there was a gap in the common enterotomy closure, so this anastomosis was resected with two 75 mm linear cutting staples, getting back to less dilated proximal bowel.  The mesentery was taken with a ligasure. The same process for the side to  side anastomosis was performed on the antimesenteric portion of the small bowel using a 75 mm linear cutting stapler and a TA 72m to close the common enterotomy. The staple lines were investigated and everything was intact. Two crotch sutures with 3-0 Silk were placed and the staple line was oversewn with 3-0 silk Lembert as the proximal bowel was very edematous and thickened.  The mesentery was closed with a 3-0 Chromic gut. The anastomosis looked healthy and viable at completion.  I was unable to remove all the lymph nodes in the mesentery due to the proximity to the vasculature, which I felt was very close to the SMA.   Irrigation was performed. Hemostasis was confirmed. The bowel was placed into the abdomen without any twisting. The NG was confirmed.  The wound protector was removed.   The fascia was closed in  the standard fashion using 0 PDS suture. The skin was closed with staples and a honeycomb dressing was placed.   Final inspection revealed acceptable hemostasis. All counts were correct at the end of the case. The patient was awakened from anesthesia and extubated without complication.  The patient went to the PACU in stable condition.   Curlene Labrum, MD [facility] [address] [place], [place] [address] [phone] (office)

## 2022-09-21 DIAGNOSIS — K56699 Other intestinal obstruction unspecified as to partial versus complete obstruction: Secondary | ICD-10-CM

## 2022-09-21 LAB — CBC WITH DIFFERENTIAL/PLATELET
Abs Immature Granulocytes: 0.09 10*3/uL — ABNORMAL HIGH (ref 0.00–0.07)
Basophils Absolute: 0 10*3/uL (ref 0.0–0.1)
Basophils Relative: 0 %
Eosinophils Absolute: 0 10*3/uL (ref 0.0–0.5)
Eosinophils Relative: 0 %
HCT: 34.6 % — ABNORMAL LOW (ref 39.0–52.0)
Hemoglobin: 11.7 g/dL — ABNORMAL LOW (ref 13.0–17.0)
Immature Granulocytes: 1 %
Lymphocytes Relative: 11 %
Lymphs Abs: 1.9 10*3/uL (ref 0.7–4.0)
MCH: 31.6 pg (ref 26.0–34.0)
MCHC: 33.8 g/dL (ref 30.0–36.0)
MCV: 93.5 fL (ref 80.0–100.0)
Monocytes Absolute: 1.5 10*3/uL — ABNORMAL HIGH (ref 0.1–1.0)
Monocytes Relative: 8 %
Neutro Abs: 14.2 10*3/uL — ABNORMAL HIGH (ref 1.7–7.7)
Neutrophils Relative %: 80 %
Platelets: 338 10*3/uL (ref 150–400)
RBC: 3.7 MIL/uL — ABNORMAL LOW (ref 4.22–5.81)
RDW: 13.2 % (ref 11.5–15.5)
WBC: 17.7 10*3/uL — ABNORMAL HIGH (ref 4.0–10.5)
nRBC: 0 % (ref 0.0–0.2)

## 2022-09-21 LAB — MAGNESIUM: Magnesium: 1.7 mg/dL (ref 1.7–2.4)

## 2022-09-21 LAB — BASIC METABOLIC PANEL
Anion gap: 6 (ref 5–15)
BUN: 22 mg/dL (ref 8–23)
CO2: 22 mmol/L (ref 22–32)
Calcium: 8.2 mg/dL — ABNORMAL LOW (ref 8.9–10.3)
Chloride: 106 mmol/L (ref 98–111)
Creatinine, Ser: 0.88 mg/dL (ref 0.61–1.24)
GFR, Estimated: >60 mL/min (ref >60)
Glucose, Bld: 97 mg/dL (ref 70–99)
Potassium: 3.9 mmol/L (ref 3.5–5.1)
Sodium: 134 mmol/L — ABNORMAL LOW (ref 135–145)

## 2022-09-21 LAB — PHOSPHORUS: Phosphorus: 3.6 mg/dL (ref 2.5–4.6)

## 2022-09-21 MED ORDER — MAGNESIUM SULFATE 2 GM/50ML IV SOLN
2.0000 g | Freq: Once | INTRAVENOUS | Status: AC
  Administered 2022-09-21: 2 g via INTRAVENOUS
  Filled 2022-09-21: qty 50

## 2022-09-21 NOTE — Progress Notes (Addendum)
[facility] Progress Note  1 Day Post-Op  Subjective: Sore but nothing major. NG with dark bilious fluid. No flatus.   Objective: Vital signs in last 24 hours: Temp:  [97.4 F (36.3 C)-99.1 F (37.3 C)] 98.5 F (36.9 C) (11/23 0448) Pulse Rate:  [80-98] 90 (11/23 0448) Resp:  [15-22] 20 (11/23 0448) BP: (111-130)/(62-77) 126/63 (11/23 0448) SpO2:  [93 %-97 %] 93 % (11/23 0448) Weight:  [75 kg] 75 kg (11/22 0825) Last BM Date : 09/19/22  Intake/Output from previous day: 11/22 0701 - 11/23 0700 In: 1690 [I.V.:1590; IV Piggyback:100] Out: 290 [Urine:200; Blood:10] Intake/Output this shift: Total I/O In: 1202.1 [I.V.:1202.1] Out: 400 [Urine:100; Emesis/NG output:300]  General appearance: alert and no distress Resp: normal work of breathing GI: soft, mildly distended, appropriately tender, staples c/d/I with honeycomb dressing, dry stained blood  Lab Results:  Recent Labs    09/20/22 0339 09/21/22 0437  WBC 10.9* 17.7*  HGB 11.4* 11.7*  HCT 33.8* 34.6*  PLT 347 338   BMET Recent Labs    09/20/22 0339 09/21/22 0437  NA 136 134*  K 3.4* 3.9  CL 106 106  CO2 24 22  GLUCOSE 85 97  BUN 25* 22  CREATININE 0.89 0.88  CALCIUM 8.6* 8.2*   PT/INR No results for input(s): "LABPROT", "INR" in the last 72 hours.  Studies/Results: DG Abd Portable 1 View  Result Date: 09/19/2022 CLINICAL DATA:  Provided history: NG tube placement. EXAM: PORTABLE ABDOMEN - 1 VIEW COMPARISON:  CT abdomen/pelvis 09/18/2022. FINDINGS: The nasogastric tube passes below the level left hemidiaphragm with tip terminating in the expected location of the gastric body. Multiple dilated loops of small bowel within the left upper quadrant and central abdomen, measuring up to 4.7 cm in diameter. No evidence of intraperitoneal free air on this semi-upright examination. Elevation of the left hemidiaphragm. Redemonstrated retained camera capsule within the left hemiabdomen. Levocurvature  of the lumbar spine. IMPRESSION: The nasogastric tube terminates in the expected location of the gastric body. Persistently dilated small bowel loops, consistent with the known small bowel obstruction. Electronically Signed   By: Kellie Simmering D.O.   On: 09/19/2022 12:35    Anti-infectives: Anti-infectives (From admission, onward)    Start     Dose/Rate Route Frequency Ordered Stop   09/20/22 0843  sodium chloride 0.9 % with cefoTEtan (CEFOTAN) ADS Med       Note to Pharmacy: Rushie Chestnut J: cabinet override      09/20/22 0843 09/20/22 0916   09/20/22 0841  ceFAZolin (ANCEF) 2-4 GM/100ML-% IVPB  Status:  Discontinued       Note to Pharmacy: Rushie Chestnut J: cabinet override      09/20/22 0841 09/20/22 0848       Assessment/Plan: Patient s/p SBR and capsule removal for SBO/ stricture. Doing well. PRN for pain IS, OOB Ambulate today NPO, ice ok, NG to suction unless ambulating Labs in AM, Leukocytosis post op reactive, monitor Mg replaced, urinating,  Cr wnl SCDs, lovenox Will await pathology Updated patient and wife at bedside.  Take off  telemetry    LOS: 2 days    [name] 09/21/2022

## 2022-09-22 LAB — BASIC METABOLIC PANEL
Anion gap: 8 (ref 5–15)
BUN: 19 mg/dL (ref 8–23)
CO2: 22 mmol/L (ref 22–32)
Calcium: 8.3 mg/dL — ABNORMAL LOW (ref 8.9–10.3)
Chloride: 104 mmol/L (ref 98–111)
Creatinine, Ser: 0.8 mg/dL (ref 0.61–1.24)
GFR, Estimated: >60 mL/min (ref >60)
Glucose, Bld: 93 mg/dL (ref 70–99)
Potassium: 3.8 mmol/L (ref 3.5–5.1)
Sodium: 134 mmol/L — ABNORMAL LOW (ref 135–145)

## 2022-09-22 LAB — CBC WITH DIFFERENTIAL/PLATELET
Abs Immature Granulocytes: 0.07 10*3/uL (ref 0.00–0.07)
Basophils Absolute: 0.1 10*3/uL (ref 0.0–0.1)
Basophils Relative: 0 %
Eosinophils Absolute: 0.1 10*3/uL (ref 0.0–0.5)
Eosinophils Relative: 1 %
HCT: 33.2 % — ABNORMAL LOW (ref 39.0–52.0)
Hemoglobin: 11.2 g/dL — ABNORMAL LOW (ref 13.0–17.0)
Immature Granulocytes: 0 %
Lymphocytes Relative: 9 %
Lymphs Abs: 1.7 10*3/uL (ref 0.7–4.0)
MCH: 32 pg (ref 26.0–34.0)
MCHC: 33.7 g/dL (ref 30.0–36.0)
MCV: 94.9 fL (ref 80.0–100.0)
Monocytes Absolute: 1.1 10*3/uL — ABNORMAL HIGH (ref 0.1–1.0)
Monocytes Relative: 6 %
Neutro Abs: 15 10*3/uL — ABNORMAL HIGH (ref 1.7–7.7)
Neutrophils Relative %: 84 %
Platelets: 277 10*3/uL (ref 150–400)
RBC: 3.5 MIL/uL — ABNORMAL LOW (ref 4.22–5.81)
RDW: 13.2 % (ref 11.5–15.5)
WBC: 18 10*3/uL — ABNORMAL HIGH (ref 4.0–10.5)
nRBC: 0 % (ref 0.0–0.2)

## 2022-09-22 LAB — PHOSPHORUS: Phosphorus: 2.2 mg/dL — ABNORMAL LOW (ref 2.5–4.6)

## 2022-09-22 LAB — SURGICAL PATHOLOGY

## 2022-09-22 LAB — MAGNESIUM: Magnesium: 2 mg/dL (ref 1.7–2.4)

## 2022-09-22 MED ORDER — VITAMIN C 500 MG PO TABS
500.0000 mg | ORAL_TABLET | Freq: Every day | ORAL | Status: DC
  Administered 2022-09-22 – 2022-09-25 (×4): 500 mg via ORAL
  Filled 2022-09-22 (×4): qty 1

## 2022-09-22 MED ORDER — SODIUM PHOSPHATES 45 MMOLE/15ML IV SOLN
30.0000 mmol | Freq: Once | INTRAVENOUS | Status: AC
  Administered 2022-09-22: 30 mmol via INTRAVENOUS
  Filled 2022-09-22: qty 10

## 2022-09-22 NOTE — Anesthesia Postprocedure Evaluation (Signed)
Anesthesia Post Note  Patient: Nicholas Dominguez  Procedure(s) Performed: EXPLORATORY LAPAROTOMY, removal of capsule (Abdomen) SMALL BOWEL RESECTION (Abdomen)  Patient location during evaluation: Phase II Anesthesia Type: General Level of consciousness: awake Pain management: pain level controlled Vital Signs Assessment: post-procedure vital signs reviewed and stable Respiratory status: spontaneous breathing and respiratory function stable Cardiovascular status: blood pressure returned to baseline and stable Postop Assessment: no headache and no apparent nausea or vomiting Anesthetic complications: no Comments: Late entry   No notable events documented.   Last Vitals:  Vitals:   09/22/22 0423 09/22/22 1145  BP: 131/70 132/70  Pulse: 86 79  Resp: 18 20  Temp: 36.8 C 36.7 C  SpO2: 92% 96%    Last Pain:  Vitals:   09/22/22 1145  TempSrc: Oral  PainSc:                  Louann Sjogren

## 2022-09-22 NOTE — Progress Notes (Signed)
[facility]  Updated patient and wife on pathology.  No malignancy or cancer noted. Just a stricture with adhesions.  FINAL MICROSCOPIC DIAGNOSIS:  A. SMALL BOWEL, RESECTION: -  Small intestine with serosal adhesions -  Small intestinal mucosa with mild architectural disarray  Note: Sections of the small intestinal mucosa show evidence of architectural disarray with mild crypt branching and questionable villous blunting; however, this is in the absence of evidence of activity, full-thickness inflammation, or granulomas.  There is focal evidence of adhesions.  A mass is not identified grossly or in the microscopic sections examined.  The overall findings are nonspecific.   GROSS DESCRIPTION:  Specimen: Received in formalin are 2 segments of bowel Larger segment of bowel: The larger segment measures 27.5 cm in length by up to 3.8 cm in diameter.  The serosal surface is tan-pink, smooth, and without disruption.  There is a notable area of stricture that measures 2.2 cm in diameter that is 9 cm from the closer margin and 15.5 cm from the further margin.  Opening the bowel reveals a tan glistening mucosa, including at the area of stricture.  No further distinct lesions are identified within the larger segment of bowel.  The wall has a thickness of 0.5 cm. Smaller segment of bowel: The smaller segment measures 6 cm in length by 2.0 cm in diameter.  There is a staple line present connecting 2 segments of bowel that extends into the bowel mucosa, presumably an anastomotic site (consistent with the op note on EPIC database 09/20/2022).  No further distinct lesions are grossly identified.  Curlene Labrum, MD [facility] [address] [place], [place] [address] [phone] (office)

## 2022-09-22 NOTE — Care Management Important Message (Signed)
Important Message  Patient Details  Name: Nicholas Dominguez MRN: [id number] Date of Birth: Mar 21, 1951   Medicare Important Message Given:  Yes     Tommy Medal 09/22/2022, 2:07 PM

## 2022-09-22 NOTE — Progress Notes (Signed)
[facility] Progress Note  2 Days Post-Op  Subjective: Doing good. Ambulating. Having flatus.   Objective: Vital signs in last 24 hours: Temp:  [98.2 F (36.8 C)-99.1 F (37.3 C)] 98.2 F (36.8 C) (11/24 0423) Pulse Rate:  [86-100] 86 (11/24 0423) Resp:  [16-18] 18 (11/24 0423) BP: (131-133)/(61-70) 131/70 (11/24 0423) SpO2:  [92 %-93 %] 92 % (11/24 0423) Last BM Date : 09/19/22  Intake/Output from previous day: 11/23 0701 - 11/24 0700 In: 2368.8 [I.V.:2368.8] Out: 1550 [Urine:550; Emesis/NG output:1000] Intake/Output this shift: Total I/O In: 769.4 [I.V.:769.4] Out: -   General appearance: alert and no distress Resp: normal work of breathing GI: soft, distended, staples c/d/I with dry blood on honeycomb dressing, binder in place   Lab Results:  Recent Labs    09/21/22 0437 09/22/22 0432  WBC 17.7* 18.0*  HGB 11.7* 11.2*  HCT 34.6* 33.2*  PLT 338 277   BMET Recent Labs    09/21/22 0437 09/22/22 0432  NA 134* 134*  K 3.9 3.8  CL 106 104  CO2 22 22  GLUCOSE 97 93  BUN 22 19  CREATININE 0.88 0.80  CALCIUM 8.2* 8.3*   PT/INR No results for input(s): "LABPROT", "INR" in the last 72 hours.  Studies/Results: No results found.  Anti-infectives: Anti-infectives (From admission, onward)    Start     Dose/Rate Route Frequency Ordered Stop   09/20/22 0843  sodium chloride 0.9 % with cefoTEtan (CEFOTAN) ADS Med       Note to Pharmacy: Rushie Chestnut J: cabinet override      09/20/22 0843 09/20/22 0916   09/20/22 0841  ceFAZolin (ANCEF) 2-4 GM/100ML-% IVPB  Status:  Discontinued       Note to Pharmacy: Rushie Chestnut J: cabinet override      09/20/22 0841 09/20/22 0848       Assessment/Plan: Patient s/p SBR and capsule removal for SBO/ stricture. Doing well. PRN for pain IS, OOB Ambulate  NPO, ice ok, NG to suction unless ambulating Labs in AM, Leukocytosis post op reactive, monitor Phos replaced urinating,  Cr wnl SCDs,  lovenox Will await pathology Updated patient and wife at bedside.    LOS: 3 days    [name] 09/22/2022

## 2022-09-22 NOTE — Progress Notes (Signed)
Initial Nutrition Assessment  DOCUMENTATION CODES:   Severe malnutrition in context of chronic illness  INTERVENTION:  Diet advancement per MD   When ready for oral nutrition: Start Boost Breeze TID when pt is ready for clear liquids and ProSource 45 ml TID  JUVEN BID  Vitamin C 500 mg daily  NUTRITION DIAGNOSIS:   Severe Malnutrition related to altered GI function (small bowel obstruction) as evidenced by percent weight loss, energy intake < or equal to 75% for > or equal to 1 month, severe fat depletion, severe muscle depletion, moderate muscle depletion, moderate fat depletion.  GOAL:  Pt to meet >/= 90% of their estimated nutrition needs    MONITOR:   Diet advancement, Labs, I & O's, Weight trends, Supplement acceptance  REASON FOR ASSESSMENT:   Malnutrition Screening Tool    ASSESSMENT: Patient is a 71 yo male with hx of GERD, tobacco use, who presents with concern for small bowel obstruction. Patient having nausea, vomiting and diarrhea and significant weight loss over the past 8 months per spouse.  Patient NPO following small bowel resection on 11/22 and capsule retrieved. No cancer or malignancy per MD note. NGT (16 fr) left nare. Yesterday dark bilious fluid but today minimal output observed.   Patient has been up walking twice today with his wife. No BM as yet. He is asking about diet advancement. Talked with nursing who reports possible diet advancement after NGT removed- possibly tomorrow?  Medications: protonix.   Patient endorses 66 lb weight loss over the past 8 months. According to chart his weight is down (30.8 lb/14 kg)15.8% x 6 months which is severe loss. He has been unable to eat sufficiently due to ongoing pain in stomach per patient. Energy intake </= 75% for >/= 1 month.   IVF lactated ringers @ 75 ml/hr.      Latest Ref Rng & Units 09/22/2022    4:32 AM 09/21/2022    4:37 AM 09/20/2022    3:39 AM  BMP  Glucose 70 - 99 mg/dL 93  97  85   BUN  8 - 23 mg/dL '19  22  25   '$ Creatinine 0.61 - 1.24 mg/dL 0.80  0.88  0.89   Sodium 135 - 145 mmol/L 134  134  136   Potassium 3.5 - 5.1 mmol/L 3.8  3.9  3.4   Chloride 98 - 111 mmol/L 104  106  106   CO2 22 - 32 mmol/L '22  22  24   '$ Calcium 8.9 - 10.3 mg/dL 8.3  8.2  8.6       NUTRITION - FOCUSED PHYSICAL EXAM:  Flowsheet Row Most Recent Value  Orbital Region Moderate depletion  Upper Arm Region Moderate depletion  Thoracic and Lumbar Region Severe depletion  Buccal Region Severe depletion  Temple Region Mild depletion  Clavicle Bone Region Severe depletion  Clavicle and Acromion Bone Region Moderate depletion  Dorsal Hand Severe depletion  Patellar Region Severe depletion  Anterior Thigh Region Moderate depletion  Posterior Calf Region Mild depletion  Edema (RD Assessment) None  Hair Reviewed  Eyes Reviewed  Mouth Reviewed  Skin Reviewed  Nails Reviewed      Diet Order:   Diet Order             Diet NPO time specified Except for: Ice Chips, Sips with Meds  Diet effective now                   EDUCATION NEEDS:  Education needs  have been addressed  Skin:  Skin Assessment: Skin Integrity Issues: Skin Integrity Issues:: Incisions Incisions: abdomen  Last BM:  11/21  Height:   Ht Readings from Last 1 Encounters:  09/20/22 '5\' 11"'$  (1.803 m)    Weight:   Wt Readings from Last 1 Encounters:  09/20/22 75 kg    Ideal Body Weight:    78 kg  BMI:  Body mass index is 23.06 kg/m.  Estimated Nutritional Needs:   Kcal:  2250-2400 kcal  Protein:  113-128 gr  Fluid:  > 2liters  Colman Cater MS,RD,CSG,LDN Contact: Shea Evans

## 2022-09-23 DIAGNOSIS — E43 Unspecified severe protein-calorie malnutrition: Secondary | ICD-10-CM | POA: Insufficient documentation

## 2022-09-23 LAB — CBC WITH DIFFERENTIAL/PLATELET
Abs Immature Granulocytes: 0.04 10*3/uL (ref 0.00–0.07)
Basophils Absolute: 0.1 10*3/uL (ref 0.0–0.1)
Basophils Relative: 1 %
Eosinophils Absolute: 0.6 10*3/uL — ABNORMAL HIGH (ref 0.0–0.5)
Eosinophils Relative: 5 %
HCT: 32.5 % — ABNORMAL LOW (ref 39.0–52.0)
Hemoglobin: 11.1 g/dL — ABNORMAL LOW (ref 13.0–17.0)
Immature Granulocytes: 0 %
Lymphocytes Relative: 13 %
Lymphs Abs: 1.5 10*3/uL (ref 0.7–4.0)
MCH: 32.2 pg (ref 26.0–34.0)
MCHC: 34.2 g/dL (ref 30.0–36.0)
MCV: 94.2 fL (ref 80.0–100.0)
Monocytes Absolute: 0.8 10*3/uL (ref 0.1–1.0)
Monocytes Relative: 7 %
Neutro Abs: 8.6 10*3/uL — ABNORMAL HIGH (ref 1.7–7.7)
Neutrophils Relative %: 74 %
Platelets: 286 10*3/uL (ref 150–400)
RBC: 3.45 MIL/uL — ABNORMAL LOW (ref 4.22–5.81)
RDW: 13.2 % (ref 11.5–15.5)
WBC: 11.6 10*3/uL — ABNORMAL HIGH (ref 4.0–10.5)
nRBC: 0 % (ref 0.0–0.2)

## 2022-09-23 LAB — BASIC METABOLIC PANEL
Anion gap: 10 (ref 5–15)
BUN: 13 mg/dL (ref 8–23)
CO2: 23 mmol/L (ref 22–32)
Calcium: 8.4 mg/dL — ABNORMAL LOW (ref 8.9–10.3)
Chloride: 104 mmol/L (ref 98–111)
Creatinine, Ser: 0.67 mg/dL (ref 0.61–1.24)
GFR, Estimated: >60 mL/min (ref >60)
Glucose, Bld: 74 mg/dL (ref 70–99)
Potassium: 3.5 mmol/L (ref 3.5–5.1)
Sodium: 137 mmol/L (ref 135–145)

## 2022-09-23 LAB — PHOSPHORUS: Phosphorus: 3 mg/dL (ref 2.5–4.6)

## 2022-09-23 MED ORDER — POTASSIUM CHLORIDE 20 MEQ PO PACK
60.0000 meq | PACK | Freq: Once | ORAL | Status: AC
  Administered 2022-09-23: 60 meq via ORAL
  Filled 2022-09-23: qty 3

## 2022-09-23 NOTE — Progress Notes (Signed)
NG tube removed per MD order, pt tolerated well.

## 2022-09-23 NOTE — Progress Notes (Signed)
[facility] Progress Note  3 Days Post-Op  Subjective: Doing well. Pathology of small bowel benign but discussed that he did have nodes in there that were enlarged. This is hopefully reactive but will need a CT in 8 weeks to confirm those have decreased in size. Do not want to miss a primary mesenteric lesion.   Having flatus.   Objective: Vital signs in last 24 hours: Temp:  [97.3 F (36.3 C)-98.1 F (36.7 C)] 97.8 F (36.6 C) (11/25 0553) Pulse Rate:  [72-79] 72 (11/25 0553) Resp:  [16-20] 16 (11/25 0553) BP: (132-145)/(70-80) 133/80 (11/25 0553) SpO2:  [95 %-99 %] 95 % (11/25 0553) Last BM Date : 09/19/22  Intake/Output from previous day: 11/24 0701 - 11/25 0700 In: 1103.3 [I.V.:979.3; IV Piggyback:124] Out: 400 [Emesis/NG output:400] Intake/Output this shift: No intake/output data recorded.  General appearance: alert and no distress Resp: normal work of breathing GI: soft, mildly distended, appropriately tender, binder in place   Lab Results:  Recent Labs    09/22/22 0432 09/23/22 0501  WBC 18.0* 11.6*  HGB 11.2* 11.1*  HCT 33.2* 32.5*  PLT 277 286   BMET Recent Labs    09/22/22 0432 09/23/22 0501  NA 134* 137  K 3.8 3.5  CL 104 104  CO2 22 23  GLUCOSE 93 74  BUN 19 13  CREATININE 0.80 0.67  CALCIUM 8.3* 8.4*   PT/INR No results for input(s): "LABPROT", "INR" in the last 72 hours.  Studies/Results: No results found.  Anti-infectives: Anti-infectives (From admission, onward)    Start     Dose/Rate Route Frequency Ordered Stop   09/20/22 0843  sodium chloride 0.9 % with cefoTEtan (CEFOTAN) ADS Med       Note to Pharmacy: Rushie Chestnut J: cabinet override      09/20/22 0843 09/20/22 0916   09/20/22 0841  ceFAZolin (ANCEF) 2-4 GM/100ML-% IVPB  Status:  Discontinued       Note to Pharmacy: Rushie Chestnut J: cabinet override      09/20/22 0841 09/20/22 0848       Assessment/Plan: Patient s/p SBR and capsule removal for  SBO/ stricture. Doing well. Pathology benign but will need to ensure that node in the mesentery was only reactive with CT scans. I was unable to resect it due to feeling that it was adjacent to the SMA.   PRN for pain IS, OOB Ambulate  Ng to gravity, can have juice and water  Labs in AM, Leukocytosis resolving, monitor SCDs, lovenox   LOS: 4 days    [name] 09/23/2022

## 2022-09-24 LAB — BASIC METABOLIC PANEL
Anion gap: 7 (ref 5–15)
BUN: 8 mg/dL (ref 8–23)
CO2: 24 mmol/L (ref 22–32)
Calcium: 8.4 mg/dL — ABNORMAL LOW (ref 8.9–10.3)
Chloride: 105 mmol/L (ref 98–111)
Creatinine, Ser: 0.67 mg/dL (ref 0.61–1.24)
GFR, Estimated: >60 mL/min (ref >60)
Glucose, Bld: 90 mg/dL (ref 70–99)
Potassium: 3.7 mmol/L (ref 3.5–5.1)
Sodium: 136 mmol/L (ref 135–145)

## 2022-09-24 LAB — CBC WITH DIFFERENTIAL/PLATELET
Abs Immature Granulocytes: 0.02 10*3/uL (ref 0.00–0.07)
Basophils Absolute: 0.1 10*3/uL (ref 0.0–0.1)
Basophils Relative: 1 %
Eosinophils Absolute: 0.6 10*3/uL — ABNORMAL HIGH (ref 0.0–0.5)
Eosinophils Relative: 7 %
HCT: 33.5 % — ABNORMAL LOW (ref 39.0–52.0)
Hemoglobin: 11.4 g/dL — ABNORMAL LOW (ref 13.0–17.0)
Immature Granulocytes: 0 %
Lymphocytes Relative: 20 %
Lymphs Abs: 1.7 10*3/uL (ref 0.7–4.0)
MCH: 31.8 pg (ref 26.0–34.0)
MCHC: 34 g/dL (ref 30.0–36.0)
MCV: 93.6 fL (ref 80.0–100.0)
Monocytes Absolute: 0.7 10*3/uL (ref 0.1–1.0)
Monocytes Relative: 9 %
Neutro Abs: 5.5 10*3/uL (ref 1.7–7.7)
Neutrophils Relative %: 63 %
Platelets: 294 10*3/uL (ref 150–400)
RBC: 3.58 MIL/uL — ABNORMAL LOW (ref 4.22–5.81)
RDW: 13 % (ref 11.5–15.5)
WBC: 8.6 10*3/uL (ref 4.0–10.5)
nRBC: 0 % (ref 0.0–0.2)

## 2022-09-24 MED ORDER — OXYCODONE HCL 5 MG PO TABS: 5.0000 mg | ORAL_TABLET | ORAL | Status: DC | PRN

## 2022-09-24 MED ORDER — POTASSIUM CHLORIDE 20 MEQ PO PACK
40.0000 meq | PACK | Freq: Two times a day (BID) | ORAL | Status: DC
  Administered 2022-09-24 – 2022-09-25 (×3): 40 meq via ORAL
  Filled 2022-09-24 (×3): qty 2

## 2022-09-24 NOTE — Progress Notes (Signed)
Pt given prn morphine x 1 this shift for ABD pain.  Dressing to ABD D/I with old drainage.  Active bowel sounds, BM x 2 yesterday.  No nausea.  Ambulatory independently in room.

## 2022-09-24 NOTE — Progress Notes (Signed)
[facility]  Doing well. Eating and having Bms.  BP 132/77 (BP Location: Left Arm)   Pulse 80   Temp 98.7 F (37.1 C) (Oral)   Resp 19   Ht '5\' 11"'$  (1.803 m)   Wt 75 kg   SpO2 95%   BMI 23.06 kg/m  Soft, minimally distended, staples c/d/I with honeycomb with dry staining   Patient s/p Ex lap, SBR for obstruction from a stricture, pathology all benign. Nodes in the mesentery could not be removed so will need follow up CT to ensure that these resolve.   Diet Prn for pain Hopefully home tomorrow  Curlene Labrum, MD [facility] [address] [place], [place] [address] [phone] (office)

## 2022-09-25 NOTE — Discharge Instructions (Signed)
Discharge Open Abdominal Surgery Instructions:  Common Complaints: Pain at the incision site is common. This will improve with time. Take your pain medications as described below. Some nausea is common and poor appetite. The main goal is to stay hydrated the first few days after surgery.   Diet/ Activity: Diet as tolerated. You have started and tolerated a diet in the hospital, and should continue to increase what you are able to eat.   You may not have a large appetite, but it is important to stay hydrated. Drink 64 ounces of water a day. Your appetite will return with time.  Keep a dry dressing in place over your staples daily or as needed. Some minor pink/ blood tinged drainage is expected. This will stop in a few days after surgery.  Shower per your regular routine daily.  Do not take hot showers. Take warm showers that are less than 10 minutes. Pat the incision dry. Wear an abdominal binder daily with activity. You do not have to wear this while sleeping or sitting.  Rest and listen to your body, but do not remain in bed all day.  Walk everyday for at least 15-20 minutes. Deep cough and move around every 1-2 hours in the first few days after surgery.  Do not lift > 10 lbs, perform excessive bending, pushing, pulling, squatting for 6-8 weeks after surgery.  The activity restrictions and the abdominal binder are to prevent hernia formation at your incision while you are healing.  Do not place lotions or balms on your incision unless instructed to specifically by Dr. Constance Haw.   Pain Expectations and Narcotics: -After surgery you will have pain associated with your incisions and this is normal. The pain is muscular and nerve pain, and will get better with time. -You are encouraged and expected to take non narcotic medications like tylenol and ibuprofen (when able) to treat pain as multiple modalities can aid with pain treatment. -Narcotics are only used when pain is severe or there is  breakthrough pain. -You are not expected to have a pain score of 0 after surgery, as we cannot prevent pain. A pain score of 3-4 that allows you to be functional, move, walk, and tolerate some activity is the goal. The pain will continue to improve over the days after surgery and is dependent on your surgery. -Due to [place] law, we are only able to give a certain amount of pain medication to treat post operative pain, and we only give additional narcotics on a patient by patient basis.  -For most laparoscopic surgery, studies have shown that the majority of patients only need 10-15 narcotic pills, and for open surgeries most patients only need 15-20.   -Having appropriate expectations of pain and knowledge of pain management with non narcotics is important as we do not want anyone to become addicted to narcotic pain medication.  -Using ice packs in the first 48 hours and heating pads after 48 hours, wearing an abdominal binder (when recommended), and using over the counter medications are all ways to help with pain management.   -Simple acts like meditation and mindfulness practices after surgery can also help with pain control and research has proven the benefit of these practices.  Medication: Take tylenol and ibuprofen as needed for pain control, alternating every 4-6 hours.  Example:  Tylenol '1000mg'$  @ 6am, 12noon, 6pm, 37mdnight (Do not exceed '4000mg'$  of tylenol a day). Ibuprofen '800mg'$  @ 9am, 3pm, 9pm, 3am (Do not exceed '3600mg'$  of ibuprofen a day).  Take Roxicodone for breakthrough pain every 4 hours.  Take Colace for constipation related to narcotic pain medication. If you do not have a bowel movement in 2 days, take Miralax over the counter.  Drink plenty of water to also prevent constipation.   Contact Information: If you have questions or concerns, please call our office, [phone], Monday- Thursday 8AM-5PM and Friday 8AM-12Noon.  If it is after hours or on the weekend, please call [facility]  Main Number, [phone], [phone], and ask to speak to the surgeon on call for Dr. Constance Haw at [facility].

## 2022-09-25 NOTE — Progress Notes (Signed)
LM on VM regarding CT scan results x 2.  Will mail result letter to patient home address.

## 2022-09-25 NOTE — Progress Notes (Signed)
Ng Discharge Note  Admit Date:  09/19/2022 Discharge date: 09/25/2022   Nicholas Dominguez to be D/C'd Home per MD order.  AVS completed. Patient/caregiver able to verbalize understanding.  Discharge Medication: Allergies as of 09/25/2022   No Known Allergies      Medication List     TAKE these medications    aspirin EC 81 MG tablet Take 81 mg by mouth daily.   omeprazole 40 MG capsule Commonly known as: PRILOSEC Take 1 capsule (40 mg total) by mouth daily. What changed:  when to take this reasons to take this        Discharge Assessment: Vitals:   09/24/22 2007 09/25/22 0323  BP: 117/71 127/73  Pulse: 64 (!) 59  Resp: 18 18  Temp: 98.9 F (37.2 C) (!) 97 F (36.1 C)  SpO2: 95% 95%   Skin clean, dry and intact without evidence of skin break down, no evidence of skin tears noted. IV catheter discontinued intact. Site without signs and symptoms of complications - no redness or edema noted at insertion site, patient denies c/o pain - only slight tenderness at site.  Dressing with slight pressure applied.  D/c Instructions-Education: Discharge instructions given to patient/family with verbalized understanding. D/c education completed with patient/family including follow up instructions, medication list, d/c activities limitations if indicated, with other d/c instructions as indicated by MD - patient able to verbalize understanding, all questions fully answered. Patient instructed to return to ED, call 911, or call MD for any changes in condition.  Patient escorted via [place], and D/C home via private auto.  Tsosie Billing, LPN 82/[id number]/8984 2:10 PM

## 2022-09-25 NOTE — Discharge Summary (Signed)
Physician Discharge Summary  Patient ID: Nicholas Dominguez MRN: [id number] DOB/AGE: 11-20-1950 71 y.o.  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 He had been having issues for several weeks and EGD and colonoscopy were negative. He had this CT that demonstrated dilated bowel and concern for transition in the small bowel. He was taken to the OR and a segment of small bowel was removed. His pathology came back benign but there were mesenteric nodes that were not able to be sampled due to the proximity of the SMA and I discussed with him and his wife we would need to assess this area in 8 weeks with a CT to ensure they had resolved and were secondary not the primary issue.   Prior to dc home he was eating, ambulating and having bowel function.   Consults: None  Significant Diagnostic Studies:  None   Treatments: IV hydration and Ex lap, SBR with primary anastomosis   Discharge Exam: Blood pressure 127/73, pulse (!) 59, temperature (!) 97 F (36.1 C), resp. rate 18, height '5\' 11"'$  (1.803 m), weight 75 kg, SpO2 95 %. General appearance: alert and no distress GI: soft, nondistended, appropriately tender, staple with erythema just at the staple insertion, not spreading out, looks reactive, told patient to monitor, no drainage   Disposition: Discharge disposition: 01-Home or Self Care       Discharge Instructions     Call MD for:  difficulty breathing, headache or visual disturbances   Complete by: As directed    Call MD for:  extreme fatigue   Complete by: As directed    Call MD for:  persistant dizziness or light-headedness   Complete by: As directed     Call MD for:  persistant nausea and vomiting   Complete by: As directed    Call MD for:  redness, tenderness, or signs of infection (pain, swelling, redness, odor or green/yellow discharge around incision site)   Complete by: As directed    Call MD for:  severe uncontrolled pain   Complete by: As directed    Call MD for:  temperature >100.4   Complete by: As directed    Increase activity slowly   Complete by: As directed       Allergies as of 09/25/2022   No Known Allergies      Medication List     TAKE these medications    aspirin EC 81 MG tablet Take 81 mg by mouth daily.   omeprazole 40 MG capsule Commonly known as: PRILOSEC Take 1 capsule (40 mg total) by mouth daily. What changed:  when to take this reasons to take this        Follow-up Information     [name], MD Follow up on 10/05/2022.   Specialty: General Surgery Why: staple removal Contact information: [address] [place] [facility] [id number] [phone]                 Signed: [name] 09/25/2022, 12:47 PM

## 2022-09-28 ENCOUNTER — Encounter (HOSPITAL_COMMUNITY): Payer: Self-pay | Admitting: General Surgery

## 2022-10-05 ENCOUNTER — Ambulatory Visit (INDEPENDENT_AMBULATORY_CARE_PROVIDER_SITE_OTHER): Payer: Medicare Other | Admitting: General Surgery

## 2022-10-05 ENCOUNTER — Encounter: Payer: Self-pay | Admitting: General Surgery

## 2022-10-05 VITALS — BP 113/68 | HR 67 | Temp 98.1°F | Resp 12 | Ht 71.0 in | Wt 157.0 lb

## 2022-10-05 DIAGNOSIS — K56609 Unspecified intestinal obstruction, unspecified as to partial versus complete obstruction: Secondary | ICD-10-CM

## 2022-10-05 NOTE — Progress Notes (Signed)
[facility]  Doing well. Having Bms and healing. Eating. Appetite improving.  BP 113/68   Pulse 67   Temp 98.1 F (36.7 C) (Oral)   Resp 12   Ht '5\' 11"'$  (1.803 m)   Wt 157 lb (71.2 kg)   SpO2 90%   BMI 21.90 kg/m  Incision healing, no erythema or drainage Staples removed, steri strips placed   Patient s/p Ex lap, SBR for SB stricture that came back benign. Had lymph nodes in the mesentery I could not resect due to proximity to the SMA. Have discussed plans for CT with patient and his wife at about 8 weeks post op to give this time to resolve if it is going to resolve. Discussed that I do not want to miss anything like a mesenteric lymphoma or neuroendocrine tumor that was actually the primary issue and led to small bowel ischemia and the stricture since the stricture was benign. Repeating a CT will let us know if we need to do any biopsy of these nodes/ mass in the future.  Keep stools regular and soft.  Take stool softener as needed. Ok to increase diet as you tolerate. Would still try to avoid raw vegetables.   Future Appointments  Date Time Provider [place]  10/11/2022  1:45 PM Eloise Harman, DO RGA-R[facility]  11/14/2022 10:15 AM [name], MD RS-RS None  01/26/2023  1:00 PM GI-315 CT 1 GI-315CT [address] [place], MD [facility] [address] [place],  [id number] [phone] (office)

## 2022-10-05 NOTE — Patient Instructions (Signed)
Keep stools regular and soft.  Take stool softener as needed. Ok to increase diet as you tolerate. Would still try to avoid raw vegetables.

## 2022-10-11 ENCOUNTER — Encounter: Payer: Self-pay | Admitting: Internal Medicine

## 2022-10-11 ENCOUNTER — Ambulatory Visit (INDEPENDENT_AMBULATORY_CARE_PROVIDER_SITE_OTHER): Payer: Medicare Other | Admitting: Internal Medicine

## 2022-10-11 VITALS — BP 123/72 | HR 60 | Temp 97.7°F | Ht 71.0 in | Wt 161.6 lb

## 2022-10-11 DIAGNOSIS — K5904 Chronic idiopathic constipation: Secondary | ICD-10-CM

## 2022-10-11 DIAGNOSIS — K56699 Other intestinal obstruction unspecified as to partial versus complete obstruction: Secondary | ICD-10-CM | POA: Diagnosis not present

## 2022-10-11 DIAGNOSIS — K219 Gastro-esophageal reflux disease without esophagitis: Secondary | ICD-10-CM

## 2022-10-11 DIAGNOSIS — R634 Abnormal weight loss: Secondary | ICD-10-CM | POA: Diagnosis not present

## 2022-10-11 NOTE — Patient Instructions (Addendum)
I just spoke with Dr. Constance Haw.  She is planning on order your CT during your office visit with her on January 16.  I am happy to hear that you are doing well.  Continue to monitor your weight.  You can try MiraLAX 1-2 capfuls daily and see if this works better than the over-the-counter stool softener.  Follow-up with me in 4 months.  I hope you both have a very Merry Christmas.  Dr. Abbey Chatters    At [facility] we value your feedback. If you would like to leave [place] a Google review, please share your experience as we strive to create trusting relationships with our patients to provide genuine, compassionate, quality care.

## 2022-10-11 NOTE — Progress Notes (Signed)
Referring Provider: Eloise Harman, DO Primary Care Physician:  Eloise Harman, DO Primary GI:  Dr. Abbey Chatters  Chief Complaint  Patient presents with   Hospitalization Follow-up    Follow up hospitalization for small bowel obstruction. Takes omeprazole at night and wonders if he needs to continue on med.     HPI:   Nicholas Dominguez is a 71 y.o. male who presents to clinic today for follow-up visit.  History of chronic GERD well-controlled on omeprazole.  Upper endoscopy 04/06/2022 with gastritis, negative for H. pylori.  Colonoscopy 04/06/2022 with 2 polyps removed, 1 adenomatous colon polyp, 5-year recall.  Previously seen for weight loss, abdominal pain, abnormal enteritis with stricture.  He underwent exploratory laparotomy with small bowel resection of small bowel stricture 09/20/2022 by Dr. Constance Haw.  Pathology benign.  Did have some enlarged lymph nodes in the area which were unresectable due to proximity to the SMA.  Today's states he is doing very well.  Eating well.  Some mild constipation for which she is taken over-the-counter stool softeners.  He is very happy overall.  Past Medical History:  Diagnosis Date   Tobacco use     Past Surgical History:  Procedure Laterality Date   BIOPSY  04/06/2022   Procedure: BIOPSY;  Surgeon: Eloise Harman, DO;  [facility];  Service: Endoscopy;;   BOWEL RESECTION  09/20/2022   Procedure: SMALL BOWEL RESECTION;  Surgeon: [name], MD;  Location: [facility] ORS;  Service: General;;   COLONOSCOPY WITH PROPOFOL N/A 04/06/2022   Procedure: COLONOSCOPY WITH PROPOFOL;  Surgeon: Eloise Harman, DO;  Location: [facility];  Service: Endoscopy;  Laterality: N/A;  11:00am   ESOPHAGOGASTRODUODENOSCOPY (EGD) WITH PROPOFOL N/A 04/06/2022   Procedure: ESOPHAGOGASTRODUODENOSCOPY (EGD) WITH PROPOFOL;  Surgeon: Eloise Harman, DO;  Location[facility];  Service: Endoscopy;  Laterality: N/A;   GIVENS CAPSULE STUDY N/A 09/04/2022    Procedure: GIVENS CAPSULE STUDY;  Surgeon: Eloise Harman, DO;  Location[facility];  Service: Endoscopy;  Laterality: N/A;  7:30am   LAPAROTOMY N/A 09/20/2022   Procedure: EXPLORATORY LAPAROTOMY, removal of capsule;  Surgeon: [name], MD;  Location: [facility];  Service: General;  Laterality: N/A;   POLYPECTOMY  04/06/2022   Procedure: POLYPECTOMY;  Surgeon: Eloise Harman, DO;  Location: [facility];  Service: Endoscopy;;   TONSILLECTOMY     VASECTOMY      Current Outpatient Medications  Medication Sig Dispense Refill   aspirin 81 MG EC tablet Take 81 mg by mouth daily.     Multiple Vitamin (MULTIVITAMIN) tablet Take 1 tablet by mouth daily.     NIACIN PO Take by mouth. Otc one daily     omeprazole (PRILOSEC) 40 MG capsule Take 1 capsule (40 mg total) by mouth daily. 30 capsule 11   No current facility-administered medications for this visit.    Allergies as of 10/11/2022   (No Known Allergies)    Family History  Problem Relation Age of Onset   Heart disease Mother    Prostate cancer Father    Stroke Father     Social History   Socioeconomic History   Marital status: Married    Spouse name: Not on file   Number of children: Not on file   Years of education: Not on file   Highest education level: Not on file  Occupational History   Not on file  Tobacco Use   Smoking status: Former  Packs/day: 0.25    Types: Cigarettes    Passive exposure: Past   Smokeless tobacco: Never  Vaping Use   Vaping Use: Never used  Substance and Sexual Activity   Alcohol use: Not Currently   Drug use: Not Currently   Sexual activity: Yes  Other Topics Concern   Not on file  Social History Narrative   Not on file   Social Determinants of Health   Financial Resource Strain: Not on file  Food Insecurity: No Food Insecurity (09/19/2022)   Hunger Vital Sign    Worried About Running Out of Food in the Last Year: Never true    Ran Out of Food in the Last Year:  Never true  Transportation Needs: No Transportation Needs (09/19/2022)   PRAPARE - Hydrologist (Medical): No    Lack of Transportation (Non-Medical): No  Physical Activity: Not on file  Stress: Not on file  Social Connections: Not on file    Subjective: Review of Systems  Constitutional:  Negative for chills and fever.  HENT:  Negative for congestion and hearing loss.   Eyes:  Negative for blurred vision and double vision.  Respiratory:  Negative for cough and shortness of breath.   Cardiovascular:  Negative for chest pain and palpitations.  Gastrointestinal:  Positive for abdominal pain, constipation and heartburn. Negative for blood in stool, diarrhea, melena and vomiting.  Genitourinary:  Negative for dysuria and urgency.  Musculoskeletal:  Negative for joint pain and myalgias.  Skin:  Negative for itching and rash.  Neurological:  Negative for dizziness and headaches.  Psychiatric/Behavioral:  Negative for depression. The patient is not nervous/anxious.      Objective: BP 123/72 (BP Location: Left Arm, Patient Position: Sitting, Cuff Size: Normal)   Pulse 60   Temp 97.7 F (36.5 C) (Oral)   Ht '5\' 11"'$  (1.803 m)   Wt 161 lb 9.6 oz (73.3 kg)   BMI 22.54 kg/m  Physical Exam Constitutional:      Appearance: Normal appearance.  HENT:     Head: Normocephalic and atraumatic.  Eyes:     Extraocular Movements: Extraocular movements intact.     Conjunctiva/sclera: Conjunctivae normal.  Cardiovascular:     Rate and Rhythm: Normal rate and regular rhythm.  Pulmonary:     Effort: Pulmonary effort is normal.     Breath sounds: Normal breath sounds.  Abdominal:     General: Bowel sounds are normal.     Palpations: Abdomen is soft.  Musculoskeletal:        General: Normal range of motion.     Cervical back: Normal range of motion and neck supple.  Skin:    General: Skin is warm.  Neurological:     General: No focal deficit present.     Mental  Status: He is alert and oriented to person, place, and time.  Psychiatric:        Mood and Affect: Mood normal.        Behavior: Behavior normal.      Assessment: *Chronic GERD-well-controlled on omeprazole *Abdominal pain-resolved *Weight loss improving *Small bowel stricture with lymphadenopathy s/p ex lap with SBR  Plan: Chronic GERD well-controlled on omeprazole.  We will continue.  Overall, patient looks great in clinic today.  Much improved.  Already putting back on weight.  Continue over-the-counter stool softener, can try MiraLAX instead to see if this helps more.  Discussed case with Dr. Constance Haw.  Plan to repeat CT imaging approximately 8 weeks  postop to reevaluate lymphadenopathy.  Hopefully this was reactive and has resolved.  Follow-up with GI in 4 months  10/11/2022 3:45 PM   Disclaimer: This note was dictated with voice recognition software. Similar sounding words can inadvertently be transcribed and may not be corrected upon review.

## 2022-10-28 LAB — GRAM STAIN W/SPUTUM CULT RFLX

## 2022-10-28 LAB — AFB CULTURE WITH SMEAR (NOT AT ARMC)
Acid Fast Culture: NEGATIVE
Acid Fast Smear: NEGATIVE

## 2022-11-14 ENCOUNTER — Encounter: Payer: Medicare Other | Admitting: General Surgery

## 2022-11-15 ENCOUNTER — Encounter: Payer: Self-pay | Admitting: General Surgery

## 2022-11-15 ENCOUNTER — Ambulatory Visit (INDEPENDENT_AMBULATORY_CARE_PROVIDER_SITE_OTHER): Payer: Medicare Other | Admitting: General Surgery

## 2022-11-15 VITALS — BP 119/72 | HR 69 | Temp 98.2°F | Resp 14 | Ht 71.0 in | Wt 175.0 lb

## 2022-11-15 DIAGNOSIS — R599 Enlarged lymph nodes, unspecified: Secondary | ICD-10-CM

## 2022-11-15 DIAGNOSIS — K56609 Unspecified intestinal obstruction, unspecified as to partial versus complete obstruction: Secondary | ICD-10-CM

## 2022-11-15 NOTE — Progress Notes (Signed)
[facility] Note   HPI:  72 y.o. Male presents to clinic for follow-up evaluation of his mesenteric adenopathy. He had a SBR for obstruction and stricture and had associated lymph nodes that I could not resect due to the proximity to the SMA. Given this I had talked to him and his wife and discussed the need to repeat his CT to ensure the adenopathy was resolved since we are hoping the adenopathy was the secondary pathology / reactive and not the primary pathology.   He is doing well and eating. Having no pain or issues. He is having Bms.   Review of Systems:  Regular Bms All other review of systems: otherwise negative   Vital Signs:  BP 119/72   Pulse 69   Temp 98.2 F (36.8 C) (Oral)   Resp 14   Ht '5\' 11"'$  (1.803 m)   Wt 175 lb (79.4 kg)   SpO2 96%   BMI 24.41 kg/m    Physical Exam:  Physical Exam Vitals reviewed.  Cardiovascular:     Rate and Rhythm: Normal rate.  Pulmonary:     Effort: Pulmonary effort is normal.  Abdominal:     General: There is no distension.     Palpations: Abdomen is soft.     Tenderness: There is no abdominal tenderness.     Hernia: No hernia is present.  Neurological:     Mental Status: He is alert.      Assessment:  72 y.o. yo Male with recent SBR for obstruction/ stricture and retained SB capsule that had mesenteric adenopathy on his imaging and on exam. The adenopathy could not safely be resected. His pathology was benign for the small bowel but I want to verify the adenopathy is improved/ resolving and that we were not dealing with lymphoma or a neuroendocrine tumor as the primary issue that then lead to the stricture from poor blood flow.    Plan:  CT scan has been ordered and will be scheduled. Someone should call you in the next week with a time. It may be in February before the appt.  If anything changes, give [place] a call.  I will call you with the CT results and plan.   Future Appointments  Date Time Provider  [place]  12/28/2022  4:00 PM AP-CT 1 AP-CT [place] [facility]  01/26/2023  1:00 PM GI-315 CT 1 GI-315CT GI-[address]  02/08/2023  9:30 AM Eloise Harman, DO RGA-RGA RGA   Everything was discussed with the patient and family and they are in agreement.   Curlene Labrum, MD [facility] [address] [place], [place] [phone] (office)

## 2022-11-15 NOTE — Patient Instructions (Addendum)
CT scan has been ordered and will be scheduled. Someone should call you in the next week with a time. It may be in February before the appt.  If anything changes, give [place] a call.  I will call you with the CT results and plan.

## 2022-11-30 DIAGNOSIS — I213 ST elevation (STEMI) myocardial infarction of unspecified site: Secondary | ICD-10-CM

## 2022-11-30 HISTORY — DX: ST elevation (STEMI) myocardial infarction of unspecified site: I21.3

## 2022-12-14 ENCOUNTER — Encounter (HOSPITAL_COMMUNITY): Payer: Self-pay

## 2022-12-14 ENCOUNTER — Inpatient Hospital Stay (HOSPITAL_COMMUNITY)
Admission: EM | Disposition: A | Discharge status: Home / Self Care | Payer: Self-pay | Source: Home / Self Care | Attending: Thoracic Surgery (Cardiothoracic Vascular Surgery)

## 2022-12-14 ENCOUNTER — Other Ambulatory Visit: Payer: Self-pay

## 2022-12-14 ENCOUNTER — Emergency Department (HOSPITAL_COMMUNITY): Payer: Medicare Other

## 2022-12-14 ENCOUNTER — Inpatient Hospital Stay (HOSPITAL_COMMUNITY)
Admission: EM | Admit: 2022-12-14 | Discharge: 2022-12-23 | DRG: 234 | Disposition: A | Discharge status: Home / Self Care | Payer: Medicare Other | Attending: Thoracic Surgery (Cardiothoracic Vascular Surgery) | Admitting: Thoracic Surgery (Cardiothoracic Vascular Surgery)

## 2022-12-14 DIAGNOSIS — I2102 ST elevation (STEMI) myocardial infarction involving left anterior descending coronary artery: Secondary | ICD-10-CM

## 2022-12-14 DIAGNOSIS — Z01810 Encounter for preprocedural cardiovascular examination: Secondary | ICD-10-CM | POA: Diagnosis not present

## 2022-12-14 DIAGNOSIS — Z7982 Long term (current) use of aspirin: Secondary | ICD-10-CM | POA: Diagnosis not present

## 2022-12-14 DIAGNOSIS — J432 Centrilobular emphysema: Secondary | ICD-10-CM

## 2022-12-14 DIAGNOSIS — E871 Hypo-osmolality and hyponatremia: Secondary | ICD-10-CM | POA: Diagnosis present

## 2022-12-14 DIAGNOSIS — Z8249 Family history of ischemic heart disease and other diseases of the circulatory system: Secondary | ICD-10-CM | POA: Diagnosis not present

## 2022-12-14 DIAGNOSIS — I252 Old myocardial infarction: Secondary | ICD-10-CM | POA: Diagnosis not present

## 2022-12-14 DIAGNOSIS — Z8042 Family history of malignant neoplasm of prostate: Secondary | ICD-10-CM

## 2022-12-14 DIAGNOSIS — Z823 Family history of stroke: Secondary | ICD-10-CM

## 2022-12-14 DIAGNOSIS — I213 ST elevation (STEMI) myocardial infarction of unspecified site: Secondary | ICD-10-CM | POA: Diagnosis not present

## 2022-12-14 DIAGNOSIS — E8770 Fluid overload, unspecified: Secondary | ICD-10-CM | POA: Diagnosis not present

## 2022-12-14 DIAGNOSIS — Z9911 Dependence on respirator [ventilator] status: Secondary | ICD-10-CM | POA: Diagnosis not present

## 2022-12-14 DIAGNOSIS — I4891 Unspecified atrial fibrillation: Secondary | ICD-10-CM | POA: Diagnosis not present

## 2022-12-14 DIAGNOSIS — I2510 Atherosclerotic heart disease of native coronary artery without angina pectoris: Secondary | ICD-10-CM | POA: Diagnosis present

## 2022-12-14 DIAGNOSIS — D62 Acute posthemorrhagic anemia: Secondary | ICD-10-CM | POA: Diagnosis not present

## 2022-12-14 DIAGNOSIS — I214 Non-ST elevation (NSTEMI) myocardial infarction: Secondary | ICD-10-CM | POA: Diagnosis present

## 2022-12-14 DIAGNOSIS — Z79899 Other long term (current) drug therapy: Secondary | ICD-10-CM

## 2022-12-14 DIAGNOSIS — Z1152 Encounter for screening for COVID-19: Secondary | ICD-10-CM | POA: Diagnosis not present

## 2022-12-14 DIAGNOSIS — E782 Mixed hyperlipidemia: Secondary | ICD-10-CM | POA: Diagnosis present

## 2022-12-14 DIAGNOSIS — I10 Essential (primary) hypertension: Secondary | ICD-10-CM | POA: Diagnosis present

## 2022-12-14 DIAGNOSIS — K219 Gastro-esophageal reflux disease without esophagitis: Secondary | ICD-10-CM | POA: Diagnosis present

## 2022-12-14 DIAGNOSIS — R911 Solitary pulmonary nodule: Secondary | ICD-10-CM | POA: Diagnosis present

## 2022-12-14 DIAGNOSIS — Z9049 Acquired absence of other specified parts of digestive tract: Secondary | ICD-10-CM | POA: Diagnosis not present

## 2022-12-14 DIAGNOSIS — R739 Hyperglycemia, unspecified: Secondary | ICD-10-CM | POA: Diagnosis present

## 2022-12-14 DIAGNOSIS — Z87891 Personal history of nicotine dependence: Secondary | ICD-10-CM | POA: Diagnosis not present

## 2022-12-14 DIAGNOSIS — I808 Phlebitis and thrombophlebitis of other sites: Secondary | ICD-10-CM | POA: Diagnosis not present

## 2022-12-14 DIAGNOSIS — Z951 Presence of aortocoronary bypass graft: Secondary | ICD-10-CM

## 2022-12-14 HISTORY — DX: Gastro-esophageal reflux disease without esophagitis: K21.9

## 2022-12-14 HISTORY — PX: CORONARY ANGIOGRAPHY: CATH118303

## 2022-12-14 HISTORY — PX: INTRAVASCULAR PRESSURE WIRE/FFR STUDY: CATH118243

## 2022-12-14 LAB — CBC
HCT: 39.5 % (ref 39.0–52.0)
Hemoglobin: 13 g/dL (ref 13.0–17.0)
MCH: 32.3 pg (ref 26.0–34.0)
MCHC: 32.9 g/dL (ref 30.0–36.0)
MCV: 98 fL (ref 80.0–100.0)
Platelets: 367 10*3/uL (ref 150–400)
RBC: 4.03 MIL/uL — ABNORMAL LOW (ref 4.22–5.81)
RDW: 13.8 % (ref 11.5–15.5)
WBC: 9.5 10*3/uL (ref 4.0–10.5)
nRBC: 0 % (ref 0.0–0.2)

## 2022-12-14 LAB — BASIC METABOLIC PANEL
Anion gap: 8 (ref 5–15)
BUN: 16 mg/dL (ref 8–23)
CO2: 23 mmol/L (ref 22–32)
Calcium: 9.3 mg/dL (ref 8.9–10.3)
Chloride: 103 mmol/L (ref 98–111)
Creatinine, Ser: 0.95 mg/dL (ref 0.61–1.24)
GFR, Estimated: >60 mL/min (ref >60)
Glucose, Bld: 144 mg/dL — ABNORMAL HIGH (ref 70–99)
Potassium: 3.7 mmol/L (ref 3.5–5.1)
Sodium: 134 mmol/L — ABNORMAL LOW (ref 135–145)

## 2022-12-14 LAB — LIPID PANEL
Cholesterol: 217 mg/dL — ABNORMAL HIGH (ref 0–200)
Cholesterol: 177 mg/dL (ref 0–200)
HDL: 65 mg/dL (ref >40)
HDL: 55 mg/dL (ref >40)
LDL Cholesterol: 135 mg/dL — ABNORMAL HIGH (ref 0–99)
LDL Cholesterol: 115 mg/dL — ABNORMAL HIGH (ref 0–99)
Total CHOL/HDL Ratio: 3.3 RATIO
Total CHOL/HDL Ratio: 3.2 RATIO
Triglycerides: 85 mg/dL (ref <150)
Triglycerides: 36 mg/dL (ref <150)
VLDL: 17 mg/dL (ref 0–40)
VLDL: 7 mg/dL (ref 0–40)

## 2022-12-14 LAB — COMPREHENSIVE METABOLIC PANEL
ALT: 9 U/L (ref 0–44)
AST: 31 U/L (ref 15–41)
Albumin: 3.5 g/dL (ref 3.5–5.0)
Alkaline Phosphatase: 76 U/L (ref 38–126)
Anion gap: 10 (ref 5–15)
BUN: 14 mg/dL (ref 8–23)
CO2: 20 mmol/L — ABNORMAL LOW (ref 22–32)
Calcium: 8.9 mg/dL (ref 8.9–10.3)
Chloride: 102 mmol/L (ref 98–111)
Creatinine, Ser: 0.97 mg/dL (ref 0.61–1.24)
GFR, Estimated: >60 mL/min (ref >60)
Glucose, Bld: 139 mg/dL — ABNORMAL HIGH (ref 70–99)
Potassium: 3.5 mmol/L (ref 3.5–5.1)
Sodium: 132 mmol/L — ABNORMAL LOW (ref 135–145)
Total Bilirubin: 1.1 mg/dL (ref 0.3–1.2)
Total Protein: 6 g/dL — ABNORMAL LOW (ref 6.5–8.1)

## 2022-12-14 LAB — HEPARIN LEVEL (UNFRACTIONATED): Heparin Unfractionated: 0.22 IU/mL — ABNORMAL LOW (ref 0.30–0.70)

## 2022-12-14 LAB — HEMOGLOBIN A1C
Hgb A1c MFr Bld: 5.1 % (ref 4.8–5.6)
Hgb A1c MFr Bld: 5.3 % (ref 4.8–5.6)
Mean Plasma Glucose: 99.67 mg/dL
Mean Plasma Glucose: 105.41 mg/dL

## 2022-12-14 LAB — POCT ACTIVATED CLOTTING TIME: Activated Clotting Time: 228 seconds

## 2022-12-14 LAB — PROTIME-INR
INR: 1.3 — ABNORMAL HIGH (ref 0.8–1.2)
Prothrombin Time: 16.5 seconds — ABNORMAL HIGH (ref 11.4–15.2)

## 2022-12-14 LAB — TROPONIN I (HIGH SENSITIVITY)
Troponin I (High Sensitivity): 44 ng/L — ABNORMAL HIGH (ref <18)
Troponin I (High Sensitivity): 977 ng/L (ref <18)

## 2022-12-14 LAB — APTT: aPTT: >200 seconds (ref 24–36)

## 2022-12-14 SURGERY — CORONARY ANGIOGRAPHY (CATH LAB)
Anesthesia: LOCAL

## 2022-12-14 MED ORDER — SODIUM CHLORIDE 0.9 % IV SOLN
INTRAVENOUS | Status: DC | PRN
  Administered 2022-12-14: 10 mL/h via INTRAVENOUS

## 2022-12-14 MED ORDER — HYDRALAZINE HCL 20 MG/ML IJ SOLN: 10.0000 mg | INTRAMUSCULAR | Status: AC | PRN

## 2022-12-14 MED ORDER — SODIUM CHLORIDE 0.9% FLUSH
3.0000 mL | Freq: Two times a day (BID) | INTRAVENOUS | Status: DC
  Administered 2022-12-15 – 2022-12-17 (×5): 3 mL via INTRAVENOUS

## 2022-12-14 MED ORDER — VERAPAMIL HCL 2.5 MG/ML IV SOLN
INTRAVENOUS | Status: AC
  Filled 2022-12-14: qty 2

## 2022-12-14 MED ORDER — ACETAMINOPHEN 325 MG PO TABS
650.0000 mg | ORAL_TABLET | ORAL | Status: DC | PRN
  Administered 2022-12-16 – 2022-12-17 (×5): 650 mg via ORAL
  Filled 2022-12-14 (×5): qty 2

## 2022-12-14 MED ORDER — SODIUM CHLORIDE 0.9 % IV SOLN: 250.0000 mL | INTRAVENOUS | Status: DC | PRN

## 2022-12-14 MED ORDER — ATORVASTATIN CALCIUM 80 MG PO TABS
80.0000 mg | ORAL_TABLET | Freq: Every day | ORAL | Status: DC
  Administered 2022-12-14 – 2022-12-23 (×9): 80 mg via ORAL
  Filled 2022-12-14 (×9): qty 1

## 2022-12-14 MED ORDER — VERAPAMIL HCL 2.5 MG/ML IV SOLN
INTRAVENOUS | Status: DC | PRN
  Administered 2022-12-14: 10 mL via INTRA_ARTERIAL

## 2022-12-14 MED ORDER — NITROGLYCERIN 1 MG/10 ML FOR IR/CATH LAB
INTRA_ARTERIAL | Status: AC
  Filled 2022-12-14: qty 10

## 2022-12-14 MED ORDER — LABETALOL HCL 5 MG/ML IV SOLN: 10.0000 mg | INTRAVENOUS | Status: AC | PRN

## 2022-12-14 MED ORDER — HEPARIN (PORCINE) 25000 UT/250ML-% IV SOLN
950.0000 [IU]/h | INTRAVENOUS | Status: DC
  Administered 2022-12-14: 950 [IU]/h via INTRAVENOUS
  Filled 2022-12-14: qty 250

## 2022-12-14 MED ORDER — LIDOCAINE HCL (PF) 1 % IJ SOLN
INTRAMUSCULAR | Status: AC
  Filled 2022-12-14: qty 30

## 2022-12-14 MED ORDER — ONDANSETRON HCL 4 MG/2ML IJ SOLN: 4.0000 mg | Freq: Four times a day (QID) | INTRAMUSCULAR | Status: DC | PRN

## 2022-12-14 MED ORDER — SODIUM CHLORIDE 0.9% FLUSH: 3.0000 mL | INTRAVENOUS | Status: DC | PRN

## 2022-12-14 MED ORDER — ASPIRIN 81 MG PO CHEW
81.0000 mg | CHEWABLE_TABLET | Freq: Every day | ORAL | Status: DC
  Administered 2022-12-14 – 2022-12-17 (×4): 81 mg via ORAL
  Filled 2022-12-14 (×4): qty 1

## 2022-12-14 MED ORDER — HEPARIN SODIUM (PORCINE) 1000 UNIT/ML IJ SOLN
INTRAMUSCULAR | Status: DC | PRN
  Administered 2022-12-14: 5000 [IU] via INTRAVENOUS
  Administered 2022-12-14: 3000 [IU] via INTRAVENOUS

## 2022-12-14 MED ORDER — IOHEXOL 350 MG/ML SOLN
INTRAVENOUS | Status: DC | PRN
  Administered 2022-12-14: 70 mL

## 2022-12-14 MED ORDER — HEPARIN SODIUM (PORCINE) 1000 UNIT/ML IJ SOLN
INTRAMUSCULAR | Status: AC
  Filled 2022-12-14: qty 10

## 2022-12-14 MED ORDER — LIDOCAINE HCL (PF) 1 % IJ SOLN
INTRAMUSCULAR | Status: DC | PRN
  Administered 2022-12-14: 2 mL via INTRADERMAL

## 2022-12-14 MED ORDER — SODIUM CHLORIDE 0.9 % IV SOLN: INTRAVENOUS | Status: AC

## 2022-12-14 MED ORDER — HEPARIN BOLUS VIA INFUSION
4000.0000 [IU] | Freq: Once | INTRAVENOUS | Status: AC
  Administered 2022-12-14: 4000 [IU] via INTRAVENOUS

## 2022-12-14 MED ORDER — HEPARIN (PORCINE) 25000 UT/250ML-% IV SOLN
1500.0000 [IU]/h | INTRAVENOUS | Status: DC
  Administered 2022-12-14: 1100 [IU]/h via INTRAVENOUS
  Administered 2022-12-15 – 2022-12-17 (×4): 1500 [IU]/h via INTRAVENOUS
  Filled 2022-12-14 (×5): qty 250

## 2022-12-14 MED ORDER — HEPARIN (PORCINE) IN NACL 1000-0.9 UT/500ML-% IV SOLN
INTRAVENOUS | Status: DC | PRN
  Administered 2022-12-14 (×2): 500 mL

## 2022-12-14 MED ORDER — ASPIRIN 325 MG PO TABS
325.0000 mg | ORAL_TABLET | Freq: Once | ORAL | Status: AC
  Administered 2022-12-14: 325 mg via ORAL
  Filled 2022-12-14: qty 1

## 2022-12-14 SURGICAL SUPPLY — 15 items
CATH DIAG 6FR JR4 (CATHETERS) IMPLANT
CATH LAUNCHER 6FR EBU3.5 (CATHETERS) IMPLANT
CATH LAUNCHER 6FR JR4 (CATHETERS) IMPLANT
DEVICE RAD COMP TR BAND LRG (VASCULAR PRODUCTS) IMPLANT
GLIDESHEATH SLEND SS 6F .021 (SHEATH) IMPLANT
GUIDEWIRE PRESSURE X 175 (WIRE) IMPLANT
GUIDEWIRE VAS SION BLUE 190 (WIRE) IMPLANT
KIT ENCORE 26 ADVANTAGE (KITS) IMPLANT
KIT HEART LEFT (KITS) ×1 IMPLANT
PACK CARDIAC CATHETERIZATION (CUSTOM PROCEDURE TRAY) ×1 IMPLANT
TRANSDUCER W/STOPCOCK (MISCELLANEOUS) ×1 IMPLANT
TUBING CIL FLEX 10 FLL-RA (TUBING) ×1 IMPLANT
WIRE ASAHI PROWATER 180CM (WIRE) IMPLANT
WIRE EMERALD 3MM-J .035X260CM (WIRE) IMPLANT
WIRE EMERALD ST .035X260CM (WIRE) IMPLANT

## 2022-12-14 NOTE — ED Provider Notes (Addendum)
[facility] Provider Note  CSN: [id number] Arrival date & time: 12/14/22 1400  Chief Complaint(s) Chest Pain  HPI Lenorris Rutt is a 72 y.o. male with PMH GERD, small bowel obstruction who presents emergency department for evaluation of chest pain.  Patient states that 1 hour prior to arrival he had sudden onset left-sided chest pain with associated nausea, diaphoresis and pain radiating to the left arm and left jaw.  On arrival, patient alert and oriented answering questions appropriately and hemodynamically stable but initial EKG is concerning for [name].  He states that his pain is starting to improve and he took 81 aspirin prior to arrival.  Currently denies abdominal pain, vomiting, diarrhea, headache or other systemic symptoms.   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 Authorizing Provider  aspirin 81 MG EC tablet Take 81 mg by mouth daily.    [provider]  Multiple Vitamin (MULTIVITAMIN) tablet Take 1 tablet by mouth daily.    [provider]  NIACIN PO Take by mouth. Otc one daily    [provider]  omeprazole (PRILOSEC) 40 MG capsule Take 1 capsule (40 mg total) by mouth daily. 04/06/22 04/06/23  Eloise Harman, DO                                                                                                                                     Past Surgical History Past Surgical History:  Procedure Laterality Date   BIOPSY  04/06/2022   Procedure: BIOPSY;  Surgeon: Eloise Harman, DO;  [facility];  Service: Endoscopy;;   BOWEL RESECTION  09/20/2022   Procedure: SMALL BOWEL RESECTION;  Surgeon: [name], MD;  Location: [facility];  Service: General;;   COLONOSCOPY WITH PROPOFOL N/A 04/06/2022   Procedure: COLONOSCOPY WITH PROPOFOL;  Surgeon: Eloise Harman, DO;  [facility];  Service: Endoscopy;  Laterality: N/A;  11:00am   ESOPHAGOGASTRODUODENOSCOPY (EGD) WITH PROPOFOL N/A 04/06/2022   Procedure: ESOPHAGOGASTRODUODENOSCOPY (EGD) WITH PROPOFOL;  Surgeon: Eloise Harman, DO;  [facility];  Service: Endoscopy;  Laterality: N/A;   GIVENS CAPSULE STUDY N/A 09/04/2022   Procedure: GIVENS CAPSULE STUDY;  Surgeon: Eloise Harman, DO;  [facility];  Service: Endoscopy;  Laterality: N/A;  7:30am  LAPAROTOMY N/A 09/20/2022   Procedure: EXPLORATORY LAPAROTOMY, removal of capsule;  Surgeon: [name], MD;  Location: [facility];  Service: General;  Laterality: N/A;   POLYPECTOMY  04/06/2022   Procedure: POLYPECTOMY;  Surgeon: Eloise Harman, DO;  [facility];  Service: Endoscopy;;   TONSILLECTOMY     VASECTOMY     Family History Family History  Problem Relation Age of Onset   Heart disease Mother    Prostate cancer Father    Stroke Father     Social History Social History   Tobacco Use   Smoking status: Former    Packs/day: 0.25    Types: Cigarettes    Passive exposure: Past   Smokeless tobacco: Never  Vaping Use   Vaping Use: Never used  Substance Use Topics   Alcohol use: Not Currently   Drug use: Not Currently   Allergies Patient has no known allergies.  Review of Systems Review of Systems  Constitutional:  Positive for diaphoresis.  Respiratory:  Positive for shortness of breath.   Cardiovascular:  Positive for  chest pain.  Gastrointestinal:  Positive for nausea.    Physical Exam Vital Signs  I have reviewed the triage vital signs BP (!) 140/83   Pulse 97   Temp 98.4 F (36.9 C) (Oral)   Resp 18   Ht '5\' 11"'$  (1.803 m)   Wt 79.4 kg   SpO2 100%   BMI 24.41 kg/m   Physical Exam Constitutional:      General: He is not in acute distress.    Appearance: Normal appearance. He is ill-appearing.  HENT:     Head: Normocephalic and atraumatic.     Nose: No congestion or rhinorrhea.  Eyes:     General:        Right eye: No discharge.        Left eye: No discharge.     Extraocular Movements: Extraocular movements intact.     Pupils: Pupils are equal, round, and reactive to light.  Cardiovascular:     Rate and Rhythm: Normal rate and regular rhythm.     Heart sounds: No murmur heard. Pulmonary:     Effort: No respiratory distress.     Breath sounds: No wheezing or rales.  Abdominal:     General: There is no distension.     Tenderness: There is no abdominal tenderness.  Musculoskeletal:        General: Normal range of motion.     Cervical back: Normal range of motion.  Skin:    General: Skin is warm and dry.  Neurological:     General: No focal deficit present.     Mental Status: He is alert.     ED Results and Treatments Labs (all labs ordered are listed, but only abnormal results are displayed) Labs Reviewed  BASIC METABOLIC PANEL  CBC  TROPONIN I (HIGH SENSITIVITY)  Radiology No results found.  Pertinent labs & imaging results that were available during my care of the patient were reviewed by me and considered in my medical decision making (see MDM for details).  Medications Ordered in ED Medications  aspirin tablet 325 mg (325 mg Oral Given 12/14/22 1419)                                                                                                                                      Procedures .Critical Care  Performed by: Teressa Lower, MD Authorized by: Teressa Lower, MD   Critical care provider statement:    Critical care time (minutes):  30   Critical care was necessary to treat or prevent imminent or life-threatening deterioration of the following conditions:  Cardiac failure   Critical care was time spent personally by me on the following activities:  Blood draw for specimens, ordering and performing treatments and interventions, ordering and review of laboratory studies, development of treatment plan with patient or surrogate and discussions with consultants   Care discussed with: accepting provider at another facility     (including critical care time)  Medical Decision Making / ED Course   This patient presents to the ED for concern of chest pain, this involves an extensive number of treatment options, and is a complaint that carries with it a high risk of complications and morbidity.  The differential diagnosis includes ACS, Aortic Dissection, Pneumothorax, Pneumonia, Esophageal Rupture, PE, Tamponade/Pericardial Effusion, pericarditis, esophageal spasm, dysrhythmia, GERD, costochondritis.  MDM: Patient seen emergency room for evaluation of chest pain.  Physical exam largely unremarkable.  Initial ECG concerning for anterior [name] with reciprocal depressions in the inferior leads.  Spoke with Dr. Ali Lowe of cardiology who will see the patient in the emergency department and likely take the patient to the Cath Lab.  Heparin and aspirin initiated and patient transferred to [facility]   Additional history obtained: -Additional history obtained from wife -External records from outside source obtained and reviewed including: Chart review including previous notes, labs, imaging, consultation notes   Lab Tests: -I ordered, reviewed, and interpreted labs.   The pertinent results include:   Labs Reviewed  BASIC METABOLIC  PANEL  CBC  TROPONIN I (HIGH SENSITIVITY)      EKG   EKG Interpretation  Date/Time:  Thursday December 14 2022 14:09:24 EST Ventricular Rate:  87 PR Interval:  170 QRS Duration: 96 QT Interval:  374 QTC Calculation: 450 R Axis:   47 Text Interpretation: ST elevations in anterior leads with Reciprocal depressions in the inferior leads [name] ** Confirmed by Raymond ([id number]) on 12/14/2022 3:01:43 PM         Imaging Studies ordered: I ordered imaging studies including CXR I independently visualized and interpreted imaging. I agree with the radiologist interpretation   Medicines ordered and prescription drug management: Meds ordered this encounter  Medications   aspirin tablet 325  mg    -I have reviewed the patients home medicines and have made adjustments as needed  Critical interventions [name] activation, heparin, aspirin  Consultations Obtained: I requested consultation with the cardiologist on-call,  and discussed lab and imaging findings as well as pertinent plan - they recommend: Transfer to [facility]   Cardiac Monitoring: The patient was maintained on a cardiac monitor.  I personally viewed and interpreted the cardiac monitored which showed an underlying rhythm of: NSR, [name]  Social Determinants of Health:  Factors impacting patients care include: none   Reevaluation: After the interventions noted above, I reevaluated the patient and found that they have :stayed the same  Co morbidities that complicate the patient evaluation  Past Medical History:  Diagnosis Date   GERD (gastroesophageal reflux disease)    Tobacco use       Dispostion: I considered admission for this patient, and due to current [name], patient will require transfer to [facility] and hospital admission     Final Clinical Impression(s) / ED Diagnoses Final diagnoses:  None     '@PCDICTATION'$ @    Teressa Lower, MD 12/14/22 Gonzales, [place], MD 12/25/22  [phone]

## 2022-12-14 NOTE — Progress Notes (Signed)
ANTICOAGULATION CONSULT NOTE - Initial Consult  Pharmacy Consult for heparin Indication: chest pain/ACS  No Known Allergies  Patient Measurements: Height: 5' 11"$  (180.3 cm) Weight: 79.4 kg (175 lb) IBW/kg (Calculated) : 75.3 Heparin Dosing Weight: 79 kg  Vital Signs: Temp: 98.4 F (36.9 C) (02/15 1422) Temp Source: Oral (02/15 1422) BP: 146/88 (02/15 1411) Pulse Rate: 92 (02/15 1411)  Labs: No results for input(s): "HGB", "HCT", "PLT", "APTT", "LABPROT", "INR", "HEPARINUNFRC", "HEPRLOWMOCWT", "CREATININE", "CKTOTAL", "CKMB", "TROPONINIHS" in the last 72 hours.  CrCl cannot be calculated (Patient's most recent lab result is older than the maximum 21 days allowed.).   Medical History: Past Medical History:  Diagnosis Date   GERD (gastroesophageal reflux disease)    Tobacco use     Medications:  (Not in a hospital admission)   Assessment: Pharmacy consulted to dose heparin in patient with chest pain/ACS.  Patient is not on anticoagulation prior to admission.  Goal of Therapy:  Heparin level 0.3-0.7 units/ml Monitor platelets by anticoagulation protocol: Yes   Plan:  Give 4000 units bolus x 1 Start heparin infusion at 950 units/hr Check anti-Xa level in 6-8 hours and daily while on heparin Continue to monitor H&H and platelets  Margot Ables, PharmD Clinical Pharmacist 12/14/2022 2:24 PM

## 2022-12-14 NOTE — ED Triage Notes (Signed)
Left sided chest pain that started 1 hour ago and it was sudden Complains of nausea, sweating and pain radiating into his left jaw and left arm.

## 2022-12-14 NOTE — Progress Notes (Addendum)
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   Medical History: Past Medical History:  Diagnosis Date   GERD (gastroesophageal reflux disease)    Tobacco use     Medications:  Medications Prior to Admission  Medication Sig Dispense Refill Last Dose   aspirin 81 MG EC tablet Take 81 mg by mouth daily.      Multiple Vitamin (MULTIVITAMIN) tablet Take 1 tablet by mouth daily.      NIACIN PO Take by mouth. Otc one daily      omeprazole (PRILOSEC) 40 MG capsule Take 1 capsule (40 mg total) by mouth daily. 30 capsule 11     Assessment: Pharmacy consulted to dose heparin in patient with chest pain/ACS.  Patient is not on anticoagulation prior to admission.  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 Consulting CVTS. To restart heparin 2 hours post TR band removal. RN states that TR band should be off ~1800.   Goal of Therapy:  Heparin level 0.3-0.7 units/ml Monitor platelets by anticoagulation protocol: Yes   Plan:  AT 2000, restart heparin infusion at 1100 units/hr Will f/u heparin level in 8 hours Daily heparin level and CBC F/u CVTS consult  Sherlon Handing, PharmD, BCPS Please see amion for complete clinical pharmacist phone list 12/14/2022 5:32 PM  Addendum: Order inadvertently entered to start at [place] instead of 2000 so RN started heparin gtt at this time. No bleeding noted at this time. Heparin level 0.22 (subtherapeutic) - drawn incorrectly - only ~3 hours post  gtt restart. Will draw 8 hr heparin level from gtt start so at 0300.   Sherlon Handing, PharmD, BCPS Please see amion for complete clinical pharmacist phone list 12/14/2022 10:32 PM

## 2022-12-14 NOTE — H&P (Addendum)
Cardiology Admission History and Physical   Patient ID: Nicholas Dominguez MRN: [id number]; DOB: [birth year]   Admission date: 12/14/2022  PCP:  Eloise Harman, DO   [facility] Cardiologist: New  Chief Complaint:  Chest pain/[name]  Patient Profile:   Nicholas Dominguez is a 72 y.o. male with GERD and tobacco use who is being seen 12/14/2022 for the evaluation of chest pain/[name].  History of Present Illness:   Mr. [name] is a 72 yo male with PMH noted above. He denies any home meds PTA. Does not see a PCP. He reports being in his usual state of health until this afternoon. He was outside trimming hedges and cleaning up limbs. Once he got back inside he developed left sided chest pain with some shortness of breath. Reports radiation up into the left jaw. Concerned he presented to [facility] with symptoms.   In the ED initial EKG showed sinus rhythm with ST elevation in  aVR and aVL with ST depression in inferior leads. CODE [name] was called in the ED. Labs were obtained while at [facility] along with CXR which was negative. He was given ASA, SL nitro and IV heparin. Transported to [facility] emergently for cardiac catheterization. He was pain free on arrival.   Labs showed sodium 134, K+ 3.7, Cr 0.95, hsTn 44, WBC 9.5, Hgb 13.0.   Past Medical History:  Diagnosis Date   GERD (gastroesophageal reflux disease)    Tobacco use     Past Surgical History:  Procedure Laterality Date   BIOPSY  04/06/2022   Procedure: BIOPSY;  Surgeon: Eloise Harman, DO;  [facility];  Service: Endoscopy;;   BOWEL RESECTION  09/20/2022   Procedure: SMALL BOWEL RESECTION;  Surgeon: [name], MD;  Location: [facility];  Service: General;;   COLONOSCOPY WITH PROPOFOL N/A 04/06/2022   Procedure: COLONOSCOPY WITH PROPOFOL;  Surgeon: Eloise Harman, DO;  [facility];  Service: Endoscopy;  Laterality: N/A;  11:00am   ESOPHAGOGASTRODUODENOSCOPY (EGD) WITH PROPOFOL N/A 04/06/2022    Procedure: ESOPHAGOGASTRODUODENOSCOPY (EGD) WITH PROPOFOL;  Surgeon: Eloise Harman, DO;  [facility];  Service: Endoscopy;  Laterality: N/A;   GIVENS CAPSULE STUDY N/A 09/04/2022   Procedure: GIVENS CAPSULE STUDY;  Surgeon: Eloise Harman, DO;  [facility];  Service: Endoscopy;  Laterality: N/A;  7:30am   LAPAROTOMY N/A 09/20/2022   Procedure: EXPLORATORY LAPAROTOMY, removal of capsule;  Surgeon: [name], MD;  Location: [facility];  Service: General;  Laterality: N/A;   POLYPECTOMY  04/06/2022   Procedure: POLYPECTOMY;  Surgeon: Eloise Harman, DO;  [facility];  Service: Endoscopy;;   TONSILLECTOMY     VASECTOMY       Medications Prior to Admission: Prior to Admission medications   Medication Sig Start Date End Date Taking? Authorizing Provider  aspirin 81 MG EC tablet Take 81 mg by mouth daily.    [provider]  Multiple Vitamin (MULTIVITAMIN) tablet Take 1 tablet by mouth daily.    [provider]  NIACIN PO Take by mouth. Otc one daily    [provider]  omeprazole (PRILOSEC) 40 MG capsule Take 1 capsule (40 mg total) by mouth daily. 04/06/22 04/06/23  Eloise Harman, DO     Allergies:   No Known Allergies  Social History:   Social History   Socioeconomic History   Marital status: Married    Spouse name: Not on file   Number  of children: Not on file   Years of education: Not on file   Highest education level: Not on file  Occupational History   Not on file  Tobacco Use   Smoking status: Former    Packs/day: 0.25    Types: Cigarettes    Passive exposure: Past   Smokeless tobacco: Never  Vaping Use   Vaping Use: Never used  Substance and Sexual Activity   Alcohol use: Not Currently   Drug use: Not Currently   Sexual activity: Yes  Other Topics Concern   Not on file  Social History Narrative   Not on file   Social Determinants of Health   Financial Resource Strain: Not on file  Food  Insecurity: No Food Insecurity (09/19/2022)   Hunger Vital Sign    Worried About Running Out of Food in the Last Year: Never true    Ran Out of Food in the Last Year: Never true  Transportation Needs: No Transportation Needs (09/19/2022)   PRAPARE - Hydrologist (Medical): No    Lack of Transportation (Non-Medical): No  Physical Activity: Not on file  Stress: Not on file  Social Connections: Not on file  Intimate Partner Violence: Not At Risk (09/19/2022)   Humiliation, Afraid, Rape, and Kick questionnaire    Fear of Current or Ex-Partner: No    Emotionally Abused: No    Physically Abused: No    Sexually Abused: No    Family History:   The patient's family history includes Heart disease in his mother; Prostate cancer in his father; Stroke in his father.    ROS:  Please see the history of present illness.  All other ROS reviewed and negative.     Physical Exam/Data:   Vitals:   12/14/22 1420 12/14/22 1422 12/14/22 1430 12/14/22 1521  BP: (!) 140/83  133/85   Pulse: 97  86   Resp: (!) 26 18 15   $ Temp:  98.4 F (36.9 C)    TempSrc:  Oral    SpO2: 100%  100% 100%  Weight:      Height:       No intake or output data in the 24 hours ending 12/14/22 1526    12/14/2022    2:08 PM 11/15/2022    9:48 AM 10/11/2022    1:45 PM  Last 3 Weights  Weight (lbs) 175 lb 175 lb 161 lb 9.6 oz  Weight (kg) 79.379 kg 79.379 kg 73.301 kg     Body mass index is 24.41 kg/m.  General:  Well nourished, well developed, in no acute distress HEENT: normal Neck: no JVD Vascular: No carotid bruits; Distal pulses 2+ bilaterally   Cardiac:  normal S1, S2; RRR; no murmur  Lungs:  clear to auscultation bilaterally, no wheezing, rhonchi or rales  Abd: soft, nontender, no hepatomegaly  Ext: no edema Musculoskeletal:  No deformities, BUE and BLE strength normal and equal Skin: warm and dry  Neuro:  CNs 2-12 intact, no focal abnormalities noted Psych:  Normal affect     EKG:  The ECG that was done 12/14/2022 was personally reviewed and demonstrates sinus rhythm with ST elevation in aVR and aVL with ST depression in inferior leads  Relevant CV Studies:  N/a   Laboratory Data:  High Sensitivity Troponin:   Recent Labs  Lab 12/14/22 1410  TROPONINIHS 44*      Chemistry Recent Labs  Lab 12/14/22 1410  NA 134*  K 3.7  CL 103  CO2 23  GLUCOSE 144*  BUN 16  CREATININE 0.95  CALCIUM 9.3  GFRNONAA >60  ANIONGAP 8    No results for input(s): "PROT", "ALBUMIN", "AST", "ALT", "ALKPHOS", "BILITOT" in the last 168 hours. Lipids No results for input(s): "CHOL", "TRIG", "HDL", "LABVLDL", "LDLCALC", "CHOLHDL" in the last 168 hours. Hematology Recent Labs  Lab 12/14/22 1410  WBC 9.5  RBC 4.03*  HGB 13.0  HCT 39.5  MCV 98.0  MCH 32.3  MCHC 32.9  RDW 13.8  PLT 367   Thyroid No results for input(s): "TSH", "FREET4" in the last 168 hours. BNPNo results for input(s): "BNP", "PROBNP" in the last 168 hours.  DDimer No results for input(s): "DDIMER" in the last 168 hours.   Radiology/Studies:  DG Chest Port 1 View  Result Date: 12/14/2022 CLINICAL DATA:  Short of breath EXAM: PORTABLE CHEST 1 VIEW COMPARISON:  None Available. FINDINGS: Normal mediastinum and cardiac silhouette. Chronic central bronchitic markings. Normal pulmonary vasculature. No effusion, infiltrate, or pneumothorax. IMPRESSION: Chronic bronchitic markings. No acute findings. Electronically Signed   By: Suzy Bouchard M.D.   On: 12/14/2022 14:48     Assessment and Plan:   Brason Chason is a 72 y.o. male with GERD and tobacco use who is being seen 12/14/2022 for the evaluation of chest pain/[name].  [name] -- was outside trimming hedges and picking up limbs when he developed left sided chest pain with shortness of breath. EKG on arrival to the ED at [facility] showed sinus rhythm with ST elevation in aVR and aVL with ST depression in inferior leads. Given ASA, SL nitro and IV heparin.  Transported to [facility] emergently for cardiac cath. Further recommendations pending cath  Hx of tobacco use -- reports he quit smoking back in November   Risk Assessment/Risk Scores:   TIMI Risk Score for ST  Elevation MI:   The patient's TIMI risk score is 2, which indicates a 2.2% risk of all cause mortality at 30 days.  Severity of Illness: The appropriate patient status for this patient is INPATIENT. Inpatient status is judged to be reasonable and necessary in order to provide the required intensity of service to ensure the patient's safety. The patient's presenting symptoms, physical exam findings, and initial radiographic and laboratory data in the context of their chronic comorbidities is felt to place them at high risk for further clinical deterioration. Furthermore, it is not anticipated that the patient will be medically stable for discharge from the hospital within 2 midnights of admission.   * I certify that at the point of admission it is my clinical judgment that the patient will require inpatient hospital care spanning beyond 2 midnights from the point of admission due to high intensity of service, high risk for further deterioration and high frequency of surveillance required.*   For questions or updates, please contact [place] Please consult [email or web address] for contact info under     Signed, Reino Bellis, NP  12/14/2022 3:26 PM    ATTENDING ATTESTATION:  After conducting a review of all available clinical information with the care team, interviewing the patient, and performing a physical exam, I agree with the findings and plan described in this note.   GEN: No acute distress.   HEENT:  MMM, no JVD, no scleral icterus Cardiac: RRR, no murmurs, rubs, or gallops.  Respiratory: Clear to auscultation bilaterally. GI: Soft, nontender, non-distended  MS: No edema; No deformity. Neuro:  Nonfocal  Vasc:  +2 radial pulses  Patient is 74M with a  history of  tobacco abuse who does not follow regularly with a healthcare provider who developed acute chest pain earlier today.  His EKG demonstrated anterior STEs.  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  Given lack of chest pain and TIMI 3 flow in all vessels, he was admitted for medical therapy and evaluation for surgical revascularization.  Continue heparin gtt, ASA, BB, and high dose atorvastatin.  Obtain TTE and surgical consultation.  Lenna Sciara, MD Pager [phone]

## 2022-12-15 ENCOUNTER — Inpatient Hospital Stay (HOSPITAL_COMMUNITY): Payer: Medicare Other

## 2022-12-15 ENCOUNTER — Other Ambulatory Visit (HOSPITAL_COMMUNITY): Payer: Medicare Other

## 2022-12-15 ENCOUNTER — Encounter (HOSPITAL_COMMUNITY): Payer: Self-pay | Admitting: Internal Medicine

## 2022-12-15 DIAGNOSIS — I213 ST elevation (STEMI) myocardial infarction of unspecified site: Secondary | ICD-10-CM

## 2022-12-15 DIAGNOSIS — I2510 Atherosclerotic heart disease of native coronary artery without angina pectoris: Secondary | ICD-10-CM | POA: Diagnosis not present

## 2022-12-15 LAB — ECHOCARDIOGRAM COMPLETE
Area-P 1/2: 2.38 cm2
Calc EF: 55.4 %
Height: 71 in
S' Lateral: 3.4 cm
Single Plane A2C EF: 54 %
Single Plane A4C EF: 55.5 %
Weight: 2830.4 oz

## 2022-12-15 LAB — BASIC METABOLIC PANEL
Anion gap: 7 (ref 5–15)
BUN: 16 mg/dL (ref 8–23)
CO2: 23 mmol/L (ref 22–32)
Calcium: 9.1 mg/dL (ref 8.9–10.3)
Chloride: 109 mmol/L (ref 98–111)
Creatinine, Ser: 0.98 mg/dL (ref 0.61–1.24)
GFR, Estimated: >60 mL/min (ref >60)
Glucose, Bld: 94 mg/dL (ref 70–99)
Potassium: 4.3 mmol/L (ref 3.5–5.1)
Sodium: 139 mmol/L (ref 135–145)

## 2022-12-15 LAB — PULMONARY FUNCTION TEST
DL/VA % pred: 84 %
DL/VA: 3.39 ml/min/mmHg/L
DLCO cor % pred: 79 %
DLCO cor: 21.11 ml/min/mmHg
DLCO unc % pred: 75 %
DLCO unc: 20.02 ml/min/mmHg
FEF 25-75 Post: 2.94 L/sec
FEF 25-75 Pre: 1.75 L/sec
FEF2575-%Change-Post: 68 %
FEF2575-%Pred-Post: 119 %
FEF2575-%Pred-Pre: 70 %
FEV1-%Change-Post: 14 %
FEV1-%Pred-Post: 95 %
FEV1-%Pred-Pre: 83 %
FEV1-Post: 3.14 L
FEV1-Pre: 2.75 L
FEV1FVC-%Change-Post: 1 %
FEV1FVC-%Pred-Pre: 96 %
FEV6-%Change-Post: 14 %
FEV6-%Pred-Post: 100 %
FEV6-%Pred-Pre: 88 %
FEV6-Post: 4.3 L
FEV6-Pre: 3.76 L
FEV6FVC-%Change-Post: 1 %
FEV6FVC-%Pred-Post: 103 %
FEV6FVC-%Pred-Pre: 102 %
FVC-%Change-Post: 12 %
FVC-%Pred-Post: 97 %
FVC-%Pred-Pre: 86 %
FVC-Post: 4.39 L
FVC-Pre: 3.89 L
Post FEV1/FVC ratio: 72 %
Post FEV6/FVC ratio: 98 %
Pre FEV1/FVC ratio: 71 %
Pre FEV6/FVC Ratio: 97 %
RV % pred: 140 %
RV: 3.59 L
TLC % pred: 106 %
TLC: 7.72 L

## 2022-12-15 LAB — CBC
HCT: 37.3 % — ABNORMAL LOW (ref 39.0–52.0)
Hemoglobin: 12.9 g/dL — ABNORMAL LOW (ref 13.0–17.0)
MCH: 33.4 pg (ref 26.0–34.0)
MCHC: 34.6 g/dL (ref 30.0–36.0)
MCV: 96.6 fL (ref 80.0–100.0)
Platelets: 314 10*3/uL (ref 150–400)
RBC: 3.86 MIL/uL — ABNORMAL LOW (ref 4.22–5.81)
RDW: 14 % (ref 11.5–15.5)
WBC: 13.5 10*3/uL — ABNORMAL HIGH (ref 4.0–10.5)
nRBC: 0 % (ref 0.0–0.2)

## 2022-12-15 LAB — HEPARIN LEVEL (UNFRACTIONATED)
Heparin Unfractionated: 0.22 IU/mL — ABNORMAL LOW (ref 0.30–0.70)
Heparin Unfractionated: 0.17 IU/mL — ABNORMAL LOW (ref 0.30–0.70)
Heparin Unfractionated: 0.3 IU/mL (ref 0.30–0.70)

## 2022-12-15 LAB — SURGICAL PCR SCREEN
MRSA, PCR: NEGATIVE
Staphylococcus aureus: NEGATIVE

## 2022-12-15 MED ORDER — ALBUTEROL SULFATE (2.5 MG/3ML) 0.083% IN NEBU
2.5000 mg | INHALATION_SOLUTION | Freq: Once | RESPIRATORY_TRACT | Status: AC
  Administered 2022-12-15: 2.5 mg via RESPIRATORY_TRACT

## 2022-12-15 MED ORDER — MILRINONE LACTATE IN DEXTROSE 20-5 MG/100ML-% IV SOLN
0.3000 ug/kg/min | INTRAVENOUS | Status: DC
  Filled 2022-12-15: qty 100

## 2022-12-15 MED ORDER — TRANEXAMIC ACID (OHS) BOLUS VIA INFUSION
15.0000 mg/kg | INTRAVENOUS | Status: AC
  Administered 2022-12-18: 1203 mg via INTRAVENOUS
  Filled 2022-12-15: qty 1203

## 2022-12-15 MED ORDER — TRANEXAMIC ACID (OHS) PUMP PRIME SOLUTION
2.0000 mg/kg | INTRAVENOUS | Status: DC
  Filled 2022-12-15: qty 1.6

## 2022-12-15 MED ORDER — INSULIN REGULAR(HUMAN) IN NACL 100-0.9 UT/100ML-% IV SOLN
INTRAVENOUS | Status: AC
  Administered 2022-12-18: 1.1 [IU]/h via INTRAVENOUS
  Filled 2022-12-15: qty 100

## 2022-12-15 MED ORDER — POTASSIUM CHLORIDE 2 MEQ/ML IV SOLN
80.0000 meq | INTRAVENOUS | Status: DC
  Filled 2022-12-15: qty 40

## 2022-12-15 MED ORDER — METOPROLOL TARTRATE 12.5 MG HALF TABLET
12.5000 mg | ORAL_TABLET | Freq: Two times a day (BID) | ORAL | Status: DC
  Administered 2022-12-15 – 2022-12-17 (×5): 12.5 mg via ORAL
  Filled 2022-12-15 (×5): qty 1

## 2022-12-15 MED ORDER — NITROGLYCERIN 0.4 MG SL SUBL
0.4000 mg | SUBLINGUAL_TABLET | SUBLINGUAL | Status: DC | PRN
  Administered 2022-12-16 (×3): 0.4 mg via SUBLINGUAL
  Filled 2022-12-15 (×2): qty 1

## 2022-12-15 MED ORDER — NITROGLYCERIN IN D5W 200-5 MCG/ML-% IV SOLN
2.0000 ug/min | INTRAVENOUS | Status: AC
  Administered 2022-12-18: 3 ug/kg/min via INTRAVENOUS
  Filled 2022-12-15: qty 250

## 2022-12-15 MED ORDER — PLASMA-LYTE A IV SOLN
INTRAVENOUS | Status: DC
  Filled 2022-12-15: qty 2.5

## 2022-12-15 MED ORDER — VANCOMYCIN HCL 1250 MG/250ML IV SOLN
1250.0000 mg | INTRAVENOUS | Status: AC
  Administered 2022-12-18: 1250 mg via INTRAVENOUS
  Filled 2022-12-15: qty 250

## 2022-12-15 MED ORDER — HEPARIN 30,000 UNITS/1000 ML (OHS) CELLSAVER SOLUTION
Status: DC
  Filled 2022-12-15: qty 1000

## 2022-12-15 MED ORDER — DEXMEDETOMIDINE HCL IN NACL 400 MCG/100ML IV SOLN
0.1000 ug/kg/h | INTRAVENOUS | Status: AC
  Administered 2022-12-18: .7 ug/kg/h via INTRAVENOUS
  Filled 2022-12-15: qty 100

## 2022-12-15 MED ORDER — CEFAZOLIN SODIUM-DEXTROSE 2-4 GM/100ML-% IV SOLN
2.0000 g | INTRAVENOUS | Status: AC
  Administered 2022-12-18 (×2): 2 g via INTRAVENOUS
  Filled 2022-12-15: qty 100

## 2022-12-15 MED ORDER — NOREPINEPHRINE 4 MG/250ML-% IV SOLN
0.0000 ug/min | INTRAVENOUS | Status: DC
  Filled 2022-12-15: qty 250

## 2022-12-15 MED ORDER — PHENYLEPHRINE HCL-NACL 20-0.9 MG/250ML-% IV SOLN
30.0000 ug/min | INTRAVENOUS | Status: AC
  Administered 2022-12-18: 20 ug/min via INTRAVENOUS
  Filled 2022-12-15: qty 250

## 2022-12-15 MED ORDER — MANNITOL 20 % IV SOLN
INTRAVENOUS | Status: DC
  Filled 2022-12-15: qty 13

## 2022-12-15 MED ORDER — TRANEXAMIC ACID 1000 MG/10ML IV SOLN
1.5000 mg/kg/h | INTRAVENOUS | Status: AC
  Administered 2022-12-18: 1.5 mg/kg/h via INTRAVENOUS
  Filled 2022-12-15: qty 25

## 2022-12-15 MED ORDER — CEFAZOLIN SODIUM-DEXTROSE 2-4 GM/100ML-% IV SOLN
2.0000 g | INTRAVENOUS | Status: DC
  Filled 2022-12-15: qty 100

## 2022-12-15 MED ORDER — EPINEPHRINE HCL 5 MG/250ML IV SOLN IN NS
0.0000 ug/min | INTRAVENOUS | Status: DC
  Filled 2022-12-15: qty 250

## 2022-12-15 MED FILL — Nitroglycerin IV Soln 100 MCG/ML in D5W: INTRA_ARTERIAL | Qty: 10 | Status: AC

## 2022-12-15 NOTE — Progress Notes (Signed)
   Notified by RN that patient had an episode of chest pain. RN was notified by the secretary that patient had chest pain, and by the time she got to the room to evaluate the patient, he was chest pain free. Reported that the pain was similar to what brought him to the hospital. Pain came and went. Currently chest pain free. BP 121/78. I ordered PRN SL nitroglycerin in case patient has recurrence of chest pain. He remains on IV heparin.   Margie Billet, PA-C 12/15/2022 12:49 PM

## 2022-12-15 NOTE — TOC Progression Note (Signed)
Transition of Care [facility]) - Progression Note    Patient Details  Name: Nicholas Dominguez MRN: [id number] Date of Birth: 12-14-1950  Transition of Care [facility]) CM/SW Contact  Zenon Mayo, RN Phone Number: 12/15/2022, 4:31 PM  Clinical Narrative:    rom home, NSTEMI, has 3 vessel dz, plan for CABG  work up on [place] on hep drip.        Expected Discharge Plan and Services                                               Social Determinants of Health (SDOH) Interventions SDOH Screenings   Food Insecurity: No Food Insecurity (09/19/2022)  Housing: Low Risk  (09/19/2022)  Transportation Needs: No Transportation Needs (09/19/2022)  Utilities: Not At Risk (09/19/2022)  Tobacco Use: Medium Risk (12/15/2022)    Readmission Risk Interventions     No data to display

## 2022-12-15 NOTE — Progress Notes (Signed)
ANTICOAGULATION CONSULT NOTE   Pharmacy Consult for heparin Indication: 3V CAD  No Known Allergies  Patient Measurements: Height: 5' 11"$  (180.3 cm) Weight: 80.2 kg (176 lb 14.4 oz) IBW/kg (Calculated) : 75.3 Heparin Dosing Weight: TBW  Vital Signs: Temp: 98.3 F (36.8 C) (02/16 0719) Temp Source: Oral (02/16 0719) BP: 119/71 (02/16 0910) Pulse Rate: 76 (02/16 0910)  Labs: Recent Labs    12/14/22 1410 12/14/22 1530 12/14/22 2142 12/15/22 0144 12/15/22 1010  HGB 13.0  --   --  12.9*  --   HCT 39.5  --   --  37.3*  --   PLT 367  --   --  314  --   APTT  --  >200*  --   --   --   LABPROT  --  16.5*  --   --   --   INR  --  1.3*  --   --   --   HEPARINUNFRC  --   --  0.22* 0.22* 0.17*  CREATININE 0.95 0.97  --   --   --   TROPONINIHS 44* 977*  --   --   --      Estimated Creatinine Clearance: 73.3 mL/min (by C-G formula based on SCr of 0.97 mg/dL).   Medical History: Past Medical History:  Diagnosis Date   GERD (gastroesophageal reflux disease)    Tobacco use     Medications:  Medications Prior to Admission  Medication Sig Dispense Refill Last Dose   aspirin 81 MG EC tablet Take 81 mg by mouth daily.      Multiple Vitamin (MULTIVITAMIN) tablet Take 1 tablet by mouth daily.      NIACIN PO Take by mouth. Otc one daily      omeprazole (PRILOSEC) 40 MG capsule Take 1 capsule (40 mg total) by mouth daily. 30 capsule 11     Assessment: Pharmacy consulted to dose heparin in patient with chest pain/ACS.  Patient is not on anticoagulation prior to admission.  Heparin level came back subtherapeutic at 0.17, on 1300 units/hr. Hgb 12.9, plt 314. No s/sx of bleeding or infusion issues.   Goal of Therapy:  Heparin level 0.3-0.7 units/ml Monitor platelets by anticoagulation protocol: Yes   Plan:  Increase heparin infusion to 1500 units/hr  Order heparin level in 8 hours  Monitor daily HL, CBC, and for s/sx of bleeding  F/u CVTS consult  Antonietta Jewel, PharmD,  [place] Pharmacist  Phone: [phone] 12/15/2022 11:44 AM  Please check AMION for all [place] phone numbers After 10:00 PM, call [place] [phone]

## 2022-12-15 NOTE — Progress Notes (Addendum)
Patient's wife relayed to NS that patient is experiencing chest pain. RN rounded on patient, patient is lying in bed, eating lunch. RN obtained VS and VS stable. Patient relayed that the chest pain is "the same pain that got me in here, but it went away." Patient relayed "The pain came and went". No EKG obtained. RN reminded patient and family to avoid drinking caffeine (Patient consumes 2 cups of coffee) for now and informed nursing staff if the pain comes back. RN paged and notified cardiology. Orders to monitor patient since pain has subside and prn order of nitro SL to be added and administered as needed.

## 2022-12-15 NOTE — Progress Notes (Signed)
CARDIAC REHAB PHASE I   PRE:  Rate/Rhythm: 89 SR  BP:  Sitting: 122/76      SaO2: 97 RA  MODE:  Ambulation: 280 ft   POST:  Rate/Rhythm: 100 SR  BP:  Sitting: 119/63      SaO2: 99 RA  Pt tolerated exercise well and amb 280 ft  independently. Pt denies CP, SOB, or dizziness throughout walk. Pre-op surgery education done.  [place], RRT, BSRT 12/15/2022 1:41 PM

## 2022-12-15 NOTE — Progress Notes (Signed)
ANTICOAGULATION CONSULT NOTE   Pharmacy Consult for heparin Indication: 3V CAD  Not on File  Patient Measurements: Height: 5' 11"$  (180.3 cm) Weight: 80.2 kg (176 lb 14.4 oz) IBW/kg (Calculated) : 75.3 Heparin Dosing Weight: TBW  Vital Signs: Temp: 98 F (36.7 C) (02/16 2008) Temp Source: Oral (02/16 2008) BP: 115/71 (02/16 2008) Pulse Rate: 82 (02/16 2008)  Labs: Recent Labs    12/14/22 1410 12/14/22 1530 12/14/22 2142 12/15/22 0144 12/15/22 1010 12/15/22 2054  HGB 13.0  --   --  12.9*  --   --   HCT 39.5  --   --  37.3*  --   --   PLT 367  --   --  314  --   --   APTT  --  >200*  --   --   --   --   LABPROT  --  16.5*  --   --   --   --   INR  --  1.3*  --   --   --   --   HEPARINUNFRC  --   --    < > 0.22* 0.17* 0.30  CREATININE 0.95 0.97  --   --  0.98  --   TROPONINIHS 44* 977*  --   --   --   --    < > = values in this interval not displayed.    Estimated Creatinine Clearance: 72.6 mL/min (by C-G formula based on SCr of 0.98 mg/dL).   Medical History: Past Medical History:  Diagnosis Date   GERD (gastroesophageal reflux disease)    Tobacco use     Medications:  Medications Prior to Admission  Medication Sig Dispense Refill Last Dose   aspirin 81 MG EC tablet Take 81 mg by mouth daily.   12/14/2022   Multiple Vitamin (MULTIVITAMIN) tablet Take 1 tablet by mouth daily.   12/14/2022   NIACIN PO Take by mouth. Otc one daily   12/14/2022   omeprazole (PRILOSEC) 40 MG capsule Take 1 capsule (40 mg total) by mouth daily. 30 capsule 11     Assessment: Pharmacy consulted to dose heparin in patient with chest pain/ACS.  Patient is not on anticoagulation prior to admission.  Heparin level came back subtherapeutic at 0.17, on 1300 units/hr. Hgb 12.9, plt 314. No s/sx of bleeding or infusion issues.   PM Update: Heparin level now therapeutic at 0.30 - at the low end of normal. Will increase slightly to keep in range and recheck with AM labs.   Goal of Therapy:   Heparin level 0.3-0.7 units/ml Monitor platelets by anticoagulation protocol: Yes   Plan:  Increase heparin infusion to 1550 units/hr  Order heparin level in 8 hours  Monitor daily HL, CBC, and for s/sx of bleeding  F/u CVTS consult  Sloan Leiter, PharmD, BCPS, BCCCP Clinical Pharmacist Please refer to [facility] for [place] numbers 12/15/2022 10:41 PM

## 2022-12-15 NOTE — Plan of Care (Signed)
  Problem: Education: Goal: Knowledge of General Education information will improve Description: Including pain rating scale, medication(s)/side effects and non-pharmacologic comfort measures Outcome: Progressing   Problem: Clinical Measurements: Goal: Ability to maintain clinical measurements within normal limits will improve Outcome: Progressing Goal: Will remain free from infection Outcome: Progressing   

## 2022-12-15 NOTE — Progress Notes (Signed)
ANTICOAGULATION CONSULT NOTE - Follow Up Consult  Pharmacy Consult for heparin Indication:  CAD awaiting CVTS consult  Labs: Recent Labs    12/14/22 1410 12/14/22 1530 12/15/22 0144  HGB 13.0  --  12.9*  HCT 39.5  --  37.3*  PLT 367  --  314  HEPARINUNFRC  --   --  0.22*  CREATININE 0.95  --   --   TROPONINIHS 44* 977*  --     Assessment: 72yo male subtherapeutic on heparin with initial dosing post-cath; no infusion issues or signs of bleeding per RN.  Goal of Therapy:  Heparin level 0.3-0.7 units/ml   Plan:  Will increase heparin infusion by 2-3 units/kg/hr to 1300 units/hr and check level in 6 hours.    Wynona Neat, PharmD, BCPS  12/15/2022,3:19 AM

## 2022-12-15 NOTE — Progress Notes (Signed)
Rounding Note    Patient Name: Nicholas Dominguez Date of Encounter: 12/15/2022  [id number] [facility] Cardiologist: None   Subjective   Denies CP or shortness of breath. No complaints at present.   Inpatient Medications    Scheduled Meds:  aspirin  81 mg Oral Daily   atorvastatin  80 mg Oral Daily   [START ON 12/18/2022] epinephrine  0-10 mcg/min Intravenous To OR   [START ON 12/18/2022] heparin sodium (porcine) 2,500 Units, papaverine 30 mg in electrolyte-A (PLASMALYTE-A PH 7.4) 500 mL irrigation   Irrigation To OR   [START ON 12/18/2022] insulin   Intravenous To OR   [START ON 12/18/2022] [place] Blood Cardioplegia vial (lidocaine/magnesium/mannitol 0.26g-4g-6.4g)   Intracoronary To OR   [START ON 12/18/2022] phenylephrine  30-200 mcg/min Intravenous To OR   [START ON 12/18/2022] potassium chloride  80 mEq Other To OR   sodium chloride flush  3 mL Intravenous Q12H   [START ON 12/18/2022] tranexamic acid  15 mg/kg Intravenous To OR   [START ON 12/18/2022] tranexamic acid  2 mg/kg Intracatheter To OR   Continuous Infusions:  sodium chloride     [START ON 12/18/2022]  ceFAZolin (ANCEF) IV     [START ON 12/18/2022]  ceFAZolin (ANCEF) IV     [START ON 12/18/2022] dexmedetomidine     [START ON 12/18/2022] heparin 30,000 units/NS 1000 mL solution for CELLSAVER     heparin 1,500 Units/hr (12/15/22 1344)   [START ON 12/18/2022] milrinone     [START ON 12/18/2022] nitroGLYCERIN     [START ON 12/18/2022] norepinephrine     [START ON 12/18/2022] tranexamic acid (CYKLOKAPRON) 2,500 mg in sodium chloride 0.9 % 250 mL (10 mg/mL) infusion     [START ON 12/18/2022] vancomycin     PRN Meds: sodium chloride, acetaminophen, nitroGLYCERIN, ondansetron (ZOFRAN) IV, sodium chloride flush   Vital Signs    Vitals:   12/15/22 0521 12/15/22 0719 12/15/22 0910 12/15/22 1238  BP: 103/69 108/65 119/71 121/78  Pulse: 69 72 76 82  Resp: 18 18 16 16  $ Temp: 97.9 F (36.6 C) 98.3 F (36.8 C)  97.9 F (36.6 C)   TempSrc: Oral Oral  Oral  SpO2: 95% 95% 98% 97%  Weight: 80.2 kg     Height:        Intake/Output Summary (Last 24 hours) at 12/15/2022 1751 Last data filed at 12/15/2022 1300 Gross per 24 hour  Intake 544.9 ml  Output 825 ml  Net -280.1 ml      12/15/2022    5:21 AM 12/14/2022    5:04 PM 12/14/2022    2:08 PM  Last 3 Weights  Weight (lbs) 176 lb 14.4 oz 182 lb 12.2 oz 175 lb  Weight (kg) 80.241 kg 82.9 kg 79.379 kg      Telemetry    Sinus rhythm without significant arrhythmia - Personally Reviewed  ECG    Normal sinus rhythm with age undetermined septal infarct pattern, nonspecific ST abnormality, no ST elevation - Personally Reviewed  Physical Exam  Alert, oriented, no distress GEN: No acute distress.   Neck: No JVD Cardiac: RRR, no murmurs, rubs, or gallops.  Respiratory: Clear to auscultation bilaterally. GI: Soft, nontender, non-distended  MS: No edema; No deformity. Neuro:  Nonfocal  Psych: Normal affect   Labs    High Sensitivity Troponin:   Recent Labs  Lab 12/14/22 1410 12/14/22 1530  TROPONINIHS 44* 977*     Chemistry Recent Labs  Lab 12/14/22 1410 12/14/22 1530 12/15/22 1010  NA  134* 132* 139  K 3.7 3.5 4.3  CL 103 102 109  CO2 23 20* 23  GLUCOSE 144* 139* 94  BUN 16 14 16  $ CREATININE 0.95 0.97 0.98  CALCIUM 9.3 8.9 9.1  PROT  --  6.0*  --   ALBUMIN  --  3.5  --   AST  --  31  --   ALT  --  9  --   ALKPHOS  --  76  --   BILITOT  --  1.1  --   GFRNONAA >60 >60 >60  ANIONGAP 8 10 7    $ Lipids  Recent Labs  Lab 12/14/22 1530  CHOL 177  TRIG 36  HDL 55  LDLCALC 115*  CHOLHDL 3.2    Hematology Recent Labs  Lab 12/14/22 1410 12/15/22 0144  WBC 9.5 13.5*  RBC 4.03* 3.86*  HGB 13.0 12.9*  HCT 39.5 37.3*  MCV 98.0 96.6  MCH 32.3 33.4  MCHC 32.9 34.6  RDW 13.8 14.0  PLT 367 314   Thyroid No results for input(s): "TSH", "FREET4" in the last 168 hours.  BNPNo results for input(s): "BNP", "PROBNP" in the last 168 hours.   DDimer No results for input(s): "DDIMER" in the last 168 hours.   Radiology    ECHOCARDIOGRAM COMPLETE  Result Date: 12/15/2022    ECHOCARDIOGRAM REPORT   Patient Name:   Nicholas Dominguez Date of Exam: 12/15/2022 Medical Rec #:  [id number]     Height:       71.0 in Accession #:    [id number]    Weight:       176.9 lb Date of Birth:  [birth year]     BSA:          2.001 m Patient Age:    72 years      BP:           119/71 mmHg Patient Gender: M             HR:           72 bpm. Exam Location:  Inpatient Procedure: 2D Echo, Cardiac Doppler and Color Doppler Indications:    CAD of native vessel  History:        Patient has no prior history of Echocardiogram examinations.  Sonographer:    Phineas Douglas Referring Phys: Lucile Crater LIGHTFOOT IMPRESSIONS  1. Left ventricular ejection fraction, by estimation, is 50 to 55%. The left ventricle has low normal function. The left ventricle has no regional wall motion abnormalities. There is moderate left ventricular hypertrophy. Left ventricular diastolic parameters are consistent with Grade I diastolic dysfunction (impaired relaxation).  2. Right ventricular systolic function is normal. The right ventricular size is normal. There is normal pulmonary artery systolic pressure. The estimated right ventricular systolic pressure is 0000000 mmHg.  3. The mitral valve is normal in structure. Trivial mitral valve regurgitation. No evidence of mitral stenosis.  4. The aortic valve is normal in structure. Aortic valve regurgitation is not visualized. No aortic stenosis is present.  5. The inferior vena cava is dilated in size with >50% respiratory variability, suggesting right atrial pressure of 8 mmHg. FINDINGS  Left Ventricle: Left ventricular ejection fraction, by estimation, is 50 to 55%. The left ventricle has low normal function. The left ventricle has no regional wall motion abnormalities. The left ventricular internal cavity size was normal in size. There is moderate left ventricular  hypertrophy. Left ventricular diastolic parameters are consistent with Grade I diastolic dysfunction (impaired relaxation).  LV Wall Scoring: The mid and distal anterior septum is akinetic. Right Ventricle: The right ventricular size is normal. No increase in right ventricular wall thickness. Right ventricular systolic function is normal. There is normal pulmonary artery systolic pressure. The tricuspid regurgitant velocity is 2.10 m/s, and  with an assumed right atrial pressure of 8 mmHg, the estimated right ventricular systolic pressure is 0000000 mmHg. Left Atrium: Left atrial size was normal in size. Right Atrium: Right atrial size was normal in size. Pericardium: There is no evidence of pericardial effusion. Mitral Valve: The mitral valve is normal in structure. Trivial mitral valve regurgitation. No evidence of mitral valve stenosis. Tricuspid Valve: The tricuspid valve is normal in structure. Tricuspid valve regurgitation is trivial. No evidence of tricuspid stenosis. Aortic Valve: The aortic valve is normal in structure. Aortic valve regurgitation is not visualized. No aortic stenosis is present. Pulmonic Valve: The pulmonic valve was normal in structure. Pulmonic valve regurgitation is not visualized. No evidence of pulmonic stenosis. Aorta: The aortic root is normal in size and structure. Venous: The inferior vena cava is dilated in size with greater than 50% respiratory variability, suggesting right atrial pressure of 8 mmHg. IAS/Shunts: No atrial level shunt detected by color flow Doppler.  LEFT VENTRICLE PLAX 2D LVIDd:         4.40 cm      Diastology LVIDs:         3.40 cm      LV e' medial:    7.72 cm/s LV PW:         1.40 cm      LV E/e' medial:  7.3 LV IVS:        1.30 cm      LV e' lateral:   11.40 cm/s LVOT diam:     2.20 cm      LV E/e' lateral: 5.0 LV SV:         71 LV SV Index:   35 LVOT Area:     3.80 cm  LV Volumes (MOD) LV vol d, MOD A2C: 142.0 ml LV vol d, MOD A4C: 139.0 ml LV vol s, MOD A2C:  65.3 ml LV vol s, MOD A4C: 61.8 ml LV SV MOD A2C:     76.7 ml LV SV MOD A4C:     139.0 ml LV SV MOD BP:      79.2 ml RIGHT VENTRICLE             IVC RV Basal diam:  3.80 cm     IVC diam: 2.60 cm RV S prime:     19.80 cm/s TAPSE (M-mode): 3.0 cm LEFT ATRIUM             Index        RIGHT ATRIUM           Index LA diam:        3.40 cm 1.70 cm/m   RA Area:     15.00 cm LA Vol (A2C):   67.6 ml 33.78 ml/m  RA Volume:   37.20 ml  18.59 ml/m LA Vol (A4C):   57.2 ml 28.58 ml/m LA Biplane Vol: 62.2 ml 31.08 ml/m  AORTIC VALVE LVOT Vmax:   88.60 cm/s LVOT Vmean:  60.500 cm/s LVOT VTI:    0.186 m  AORTA Ao Root diam: 3.70 cm Ao Asc diam:  3.60 cm MITRAL VALVE               TRICUSPID VALVE MV Area (PHT): 2.38  cm    TV Peak grad:   17.6 mmHg MV Decel Time: 319 msec    TV Vmax:        2.10 m/s MV E velocity: 56.60 cm/s  TR Peak grad:   17.6 mmHg MV A velocity: 57.10 cm/s  TR Vmax:        210.00 cm/s MV E/A ratio:  0.99                            SHUNTS                            Systemic VTI:  0.19 m                            Systemic Diam: 2.20 cm Candee Furbish MD Electronically signed by Candee Furbish MD Signature Date/Time: 12/15/2022/11:43:28 AM    Final    CARDIAC CATHETERIZATION  Result Date: 12/14/2022   1st Diag lesion is 70% stenosed.   Prox LAD lesion is 80% stenosed.   2nd Mrg lesion is 80% stenosed.   Prox RCA to Mid RCA lesion is 55% stenosed. 1.  Multivessel disease consisting of proximal LAD, first diagonal, and second obtuse marginal lesions.  There is moderate disease of the mid right coronary artery which is not significant with an RFR of 0.93. Recommendation: Given that the patient has TIMI-3 flow in all vessels and is chest pain-free, the patient will be treated medically and referred for surgical revascularization.   DG Chest Port 1 View  Result Date: 12/14/2022 CLINICAL DATA:  Short of breath EXAM: PORTABLE CHEST 1 VIEW COMPARISON:  None Available. FINDINGS: Normal mediastinum and cardiac  silhouette. Chronic central bronchitic markings. Normal pulmonary vasculature. No effusion, infiltrate, or pneumothorax. IMPRESSION: Chronic bronchitic markings. No acute findings. Electronically Signed   By: Suzy Bouchard M.D.   On: 12/14/2022 14:48    Cardiac Studies   Echo and cardiac catheterization findings reviewed personally  Patient Profile     72 y.o. male presents with aborted anterior [name], found to have critical LAD/diagonal stenosis, referred for multivessel CABG with TIMI-3 flow in all vessels  Assessment & Plan    1.  Aborted anterior [name]: Patient clinically stable with a brief episode of chest discomfort earlier today but no symptoms since that time.  Will continue IV heparin until he goes to the operating room.  If he develops recurrent resting angina, would start IV nitroglycerin and may require more emergent surgery.  Otherwise continue current medical therapy as the patient remains clinically stable with no angina at present.  Will add low-dose beta-blocker. 2.  Mixed hyperlipidemia: Started on high intensity statin drug  Disposition: Keep activity to a minimum, continue IV heparin, plans for multivessel CABG on Monday per Dr. Kipp Brood.  For questions or updates, please contact [place] Please consult [email or web address] for contact info under        Signed, Sherren Mocha, MD  12/15/2022, 5:51 PM

## 2022-12-15 NOTE — Progress Notes (Signed)
Echocardiogram 2D Echocardiogram has been performed.  Nicholas Dominguez 12/15/2022, 10:01 AM

## 2022-12-15 NOTE — Consult Note (Addendum)
[name]       [place] [address]             [phone]        [name] [place] Medical Record [id number] Date of Birth: Jun 24, 1951  Referring: Dr. Ali Lowe  Primary Care: Eloise Harman, DO Primary Cardiologist: None  Reason for Consult: Evaluation for surgical management of  multi-vessel coronary artery disease presenting with acute [name].   History of Present Illness:     Nicholas Dominguez is a 72 year old gentleman with past history of gastroesophageal reflux disease, small bowel stricture with obstruction, and who is a former tobacco smoker having quit in November 2023.  He presented to the [facility] emergency room yesterday after experiencing left-sided chest pain with radiation to his left jaw and associated shortness of breath while doing some yard work.  Initial EKG showed ST elevations in aVR and aVL with inferior ST depressions.  Initial high-sensitivity troponin was 44 and later rose to greater than 900.  The chest pain subsided.  He was started on aspirin, nitroglycerin, and heparin infusion.  He was transferred to [facility] for further evaluation.  Left heart catheterization was conducted yesterday demonstrating high-grade stenoses in the left anterior descending coronary diagonal, and second OM coronary arteries.  The RCA had a 55% lesion that was evaluated with a flow catheter showing RFR of 0.93. Dr. Ali Lowe asked that we evaluate Nicholas Dominguez for consideration of surgical coronary revascularization. Nicholas Dominguez is currently resting in bed.  His wife is at the bedside.  Mr. Stinton denies any chest pain since initial treatment in the ED blood.  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  He had a laparotomy about 12 weeks ago for management of small bowel obstruction requiring a segmental small bowel resection.  He recovered well from the procedure before the obstruction was diagnosed and treated.  He  otherwise considers himself healthy.  He was only taking aspirin and Prilosec prior to admission.  Current Activity/ Functional Status: Patient is independent with mobility/ambulation, transfers, ADL's, IADL's.   Zubrod Score: At the time of surgery this patient's most appropriate activity status/level should be described as: [x]$     0    Normal activity, no symptoms []$     1    Restricted in physical strenuous activity but ambulatory, able to do out light work []$     2    Ambulatory and capable of self care, unable to do work activities, up and about                 more than 50%  Of the time                            []$     3    Only limited self care, in bed greater than 50% of waking hours []$     4    Completely disabled, no self care, confined to bed or chair []$     5    Moribund  Past Medical History:  Diagnosis Date   GERD (gastroesophageal reflux disease)    Tobacco use     Past Surgical History:  Procedure Laterality Date   BIOPSY  04/06/2022   Procedure: BIOPSY;  Surgeon: Eloise Harman, DO;  [facility]: [facility];  Service: Endoscopy;;   BOWEL RESECTION  09/20/2022   Procedure: SMALL BOWEL  RESECTION;  Surgeon: Nicholas Dominguez;  Location: [facility];  Service: General;;   COLONOSCOPY WITH PROPOFOL N/A 04/06/2022   Procedure: COLONOSCOPY WITH PROPOFOL;  Surgeon: Eloise Harman, DO;  [facility];  Service: Endoscopy;  Laterality: N/A;  11:00am   CORONARY ANGIOGRAPHY N/A 12/14/2022   Procedure: CORONARY ANGIOGRAPHY;  Surgeon: Nicholas Dominguez;  Location: [facility];  Service: Cardiovascular;  Laterality: N/A;   ESOPHAGOGASTRODUODENOSCOPY (EGD) WITH PROPOFOL N/A 04/06/2022   Procedure: ESOPHAGOGASTRODUODENOSCOPY (EGD) WITH PROPOFOL;  Surgeon: Eloise Harman, DO;  [facility];  Service: Endoscopy;  Laterality: N/A;   GIVENS CAPSULE STUDY N/A 09/04/2022   Procedure: GIVENS CAPSULE STUDY;  Surgeon: Eloise Harman, DO;  [facility];  Service: Endoscopy;  Laterality: N/A;  7:30am   INTRAVASCULAR PRESSURE WIRE/FFR STUDY N/A 12/14/2022   Procedure: INTRAVASCULAR PRESSURE WIRE/FFR STUDY;  Surgeon: Nicholas Dominguez;  Location: [place];  Service: Cardiovascular;  Laterality: N/A;   LAPAROTOMY N/A 09/20/2022   Procedure: EXPLORATORY LAPAROTOMY, removal of capsule;  Surgeon: Nicholas Dominguez;  Location: [facility];  Service: General;  Laterality: N/A;   POLYPECTOMY  04/06/2022   Procedure: POLYPECTOMY;  Surgeon: Eloise Harman, DO;  [facility];  Service: Endoscopy;;   TONSILLECTOMY     VASECTOMY      Social History   Tobacco Use  Smoking Status Former   Packs/day: 0.25   Types: Cigarettes   Passive exposure: Past  Smokeless Tobacco Never    Social History   Substance and Sexual Activity  Alcohol Use Not Currently     No Known Allergies  Current Facility-Administered Medications  Medication Dose Route Frequency Provider Last Rate Last Admin   0.9 %  sodium chloride infusion  250 mL Intravenous PRN Nicholas Dominguez       acetaminophen (TYLENOL) tablet 650 mg  650 mg Oral Q4H PRN Nicholas Dominguez       aspirin chewable tablet 81 mg  81 mg Oral Daily Nicholas Dominguez   81 mg at 12/15/22 0915   atorvastatin (LIPITOR) tablet 80 mg  80 mg Oral Daily Nicholas Dominguez   80 mg at 12/15/22 0915   heparin ADULT infusion 100 units/mL (25000 units/211m)  1,300 Units/hr Intravenous Continuous BLaren Everts RPH 13 mL/hr at 12/15/22 0400 1,300 Units/hr at 12/15/22 0400   ondansetron (ZOFRAN) injection 4 mg  4 mg Intravenous Q6H PRN TEarly Osmond Dominguez       sodium chloride flush (NS) 0.9 % injection 3 mL  3 mL Intravenous Q12H TEarly Osmond Dominguez       sodium chloride flush (NS) 0.9 % injection 3 mL  3 mL Intravenous PRN TEarly Osmond Dominguez        Medications Prior to Admission  Medication Sig Dispense Refill Last Dose   aspirin 81 MG EC tablet Take 81 mg by mouth  daily.      Multiple Vitamin (MULTIVITAMIN) tablet Take 1 tablet by mouth daily.      NIACIN PO Take by mouth. Otc one daily      omeprazole (PRILOSEC) 40 MG capsule Take 1 capsule (40 mg total) by mouth daily. 30 capsule 11     Family History  Problem Relation Age of Onset   Heart disease Mother    Prostate cancer Father    Stroke Father      Review of  Systems:       Cardiac Review of Systems: Y or  [    ]= no  Chest Pain [   x ]  Resting SOB [   ] Exertional SOB  [ x ]  Orthopnea [  ]   Pedal Edema [   ]    Palpitations [  ] Syncope  [  ]   Presyncope [   ]  General Review of Systems: [Y] = yes [  ]=no Constitional: recent weight change [x  ]; anorexia [  ]; fatigue [  ]; nausea [  ]; night sweats [  ]; fever [  ]; or chills [  ]                                                               Dental: Last Dentist visit: About 4 months ago  Eye : blurred vision [  ]; diplopia [   ]; vision changes [  ];  Amaurosis fugax[  ]; Resp: cough [  ];  wheezing[  ];  hemoptysis[  ]; shortness of breath[  ]; paroxysmal nocturnal dyspnea[  ]; dyspnea on exertion[  ]; or orthopnea[  ];  GI:  gallstones[  ], vomiting[  ];  dysphagia[  ]; melena[  ];  hematochezia [  ]; heartburn[  ];   Hx of  Colonoscopy[  ]; GU: kidney stones [  ]; hematuria[  ];   dysuria [  ];  nocturia[  ];  history of     obstruction [  ]; urinary frequency [  ]             Skin: rash, swelling[  ];, hair loss[  ];  peripheral edema[  ];  or itching[  ]; Musculosketetal: myalgias[  ];  joint swelling[  ];  joint erythema[  ];  joint pain[  ];  back pain[  ];  Heme/Lymph: bruising[  ];  bleeding[  ];  anemia[  ];  Neuro: TIA[  ];  headaches[  ];  stroke[  ];  vertigo[  ];  seizures[  ];   paresthesias[  ];  difficulty walking[  ];  Psych:depression[  ]; anxiety[  ];  Endocrine: diabetes[  ];  thyroid dysfunction[  ];              Physical Exam: BP 119/71 (BP Location: Left Arm)   Pulse 76   Temp 98.3 F (36.8 C) (Oral)    Resp 16   Ht 5' 11"$  (1.803 m)   Wt 80.2 kg   SpO2 98%   BMI 24.67 kg/m    General appearance: alert, cooperative, and no distress Head: Normocephalic, without obvious abnormality, atraumatic Neck: no adenopathy, no carotid bruit, no JVD, and supple, symmetrical, trachea midline Lymph nodes: no cervical or clavicular adenopathy Resp: breath sounds full and clear.  Cardio: RRR, no murmur GI: soft, NT, well healed laparotom incision Extremities: without deformity, warm and well perfused. No significant varicosities in LE's.  All peripheral pulses are easily palpable. Neurologic: Grossly normal  Diagnostic Studies & Laboratory data:  CORONARY ANGIOGRAPHY  INTRAVASCULAR PRESSURE WIRE/FFR STUDY   Conclusion      1st Diag lesion is 70% stenosed.   Prox LAD lesion is 80% stenosed.   2nd Mrg  lesion is 80% stenosed.   Prox RCA to Mid RCA lesion is 55% stenosed.   1.  Multivessel disease consisting of proximal LAD, first diagonal, and second obtuse marginal lesions.  There is moderate disease of the mid right coronary artery which is not significant with an RFR of 0.93.   Recommendation: Given that the patient has TIMI-3 flow in all vessels and is chest pain-free, the patient will be treated medically and referred for surgical revascularization.   Coronary Findings  Diagnostic Dominance: Right Left Anterior Descending  Prox LAD lesion is 80% stenosed.    First Diagonal Branch  1st Diag lesion is 70% stenosed.    Left Circumflex    Second Obtuse Marginal Branch  2nd Mrg lesion is 80% stenosed.    Right Coronary Artery  Prox RCA to Mid RCA lesion is 55% stenosed.    Intervention   No interventions have been documented.   Coronary Diagrams  Diagnostic Dominance: Right  Int  Indications  ACS (acute coronary syndrome) (HCC) [I24.9 (ICD-10-CM)]       Recent Radiology Findings:      DG Chest Port 1 View  Result Date: 12/14/2022 CLINICAL DATA:  Short of  breath EXAM: PORTABLE CHEST 1 VIEW COMPARISON:  None Available. FINDINGS: Normal mediastinum and cardiac silhouette. Chronic central bronchitic markings. Normal pulmonary vasculature. No effusion, infiltrate, or pneumothorax. IMPRESSION: Chronic bronchitic markings. No acute findings. Electronically Signed   By: Suzy Bouchard M.D.   On: 12/14/2022 14:48     I have independently reviewed the above radiologic studies and discussed with the patient   Recent Lab Findings: Lab Results  Component Value Date   WBC 13.5 (H) 12/15/2022   HGB 12.9 (L) 12/15/2022   HCT 37.3 (L) 12/15/2022   PLT 314 12/15/2022   GLUCOSE 139 (H) 12/14/2022   CHOL 177 12/14/2022   TRIG 36 12/14/2022   HDL 55 12/14/2022   LDLCALC 115 (H) 12/14/2022   ALT 9 12/14/2022   AST 31 12/14/2022   NA 132 (L) 12/14/2022   K 3.5 12/14/2022   CL 102 12/14/2022   CREATININE 0.97 12/14/2022   BUN 14 12/14/2022   CO2 20 (L) 12/14/2022   TSH 1.030 09/04/2022   INR 1.3 (H) 12/14/2022   HGBA1C 5.3 12/14/2022      Assessment / Plan:   72 year old male who is a former smoker with positive family history for coronary artery disease presents with acute ST elevation myocardial infarction.  Left heart catheterization demonstrates high-grade stenoses in the LAD, diagonal, and second obtuse marginal coronary arteries.  The RCA has a 55% stenosis that is not a flow-limiting lesion by RFR evaluation.  He is hemodynamically stable and free of chest pain.  We agree that coronary bypass grafting is his best option for revascularization.  The procedure and expected perioperative course have been discussed with Mr. Peardon and his wife and their questions have been answered.  They would like for [place] to proceed with preoperative evaluation and planning.  Will tentatively schedule for coronary bypass grafting on Monday, 12/18/2022 by Dr. Kipp Brood.     I  spent 25 minutes counseling the patient face to face.  Antony Odea,  PA-C  12/15/2022 9:28 AM  Agree with above.  This is a 73 year old gentleman that is admitted following a [name].  Left heart cath shows severe three-vessel coronary artery disease.  His echocardiogram shows preserved biventricular function and no significant valvular disease.  We discussed the risk and benefits of surgical  revascularization and he is agreeable to proceed.  Creedence Heiss Bary Leriche

## 2022-12-16 DIAGNOSIS — I2102 ST elevation (STEMI) myocardial infarction involving left anterior descending coronary artery: Secondary | ICD-10-CM

## 2022-12-16 LAB — CBC
HCT: 34.7 % — ABNORMAL LOW (ref 39.0–52.0)
Hemoglobin: 12 g/dL — ABNORMAL LOW (ref 13.0–17.0)
MCH: 33.1 pg (ref 26.0–34.0)
MCHC: 34.6 g/dL (ref 30.0–36.0)
MCV: 95.9 fL (ref 80.0–100.0)
Platelets: 291 10*3/uL (ref 150–400)
RBC: 3.62 MIL/uL — ABNORMAL LOW (ref 4.22–5.81)
RDW: 13.9 % (ref 11.5–15.5)
WBC: 11.7 10*3/uL — ABNORMAL HIGH (ref 4.0–10.5)
nRBC: 0 % (ref 0.0–0.2)

## 2022-12-16 LAB — HEPARIN LEVEL (UNFRACTIONATED): Heparin Unfractionated: 0.36 IU/mL (ref 0.30–0.70)

## 2022-12-16 MED ORDER — NITROGLYCERIN IN D5W 200-5 MCG/ML-% IV SOLN
INTRAVENOUS | Status: AC
  Filled 2022-12-16: qty 250

## 2022-12-16 MED ORDER — NITROGLYCERIN IN D5W 200-5 MCG/ML-% IV SOLN
0.0000 ug/min | INTRAVENOUS | Status: DC
  Administered 2022-12-16: 3 ug/min via INTRAVENOUS

## 2022-12-16 NOTE — Progress Notes (Signed)
      [address]       [place] [address]             [phone]      2 Days Post-Op Procedure(s) (LRB): CORONARY ANGIOGRAPHY (N/A) INTRAVASCULAR PRESSURE WIRE/FFR STUDY (N/A) Subjective: Sitting up in the bedside chair.  He apparently had some chest pain midday yesterday after having a couple cups of coffee.  This resolved without any intervention.  If having any chest pain overnight or this morning.  Heparin is infusing.  Objective: Vital signs in last 24 hours: Temp:  [97.9 F (36.6 C)-98.5 F (36.9 C)] 98.5 F (36.9 C) (02/17 1000) Pulse Rate:  [67-82] 67 (02/17 1000) Cardiac Rhythm: Normal sinus rhythm (02/17 0848) Resp:  [16-18] 18 (02/17 1000) BP: (108-121)/(61-78) 117/75 (02/17 1000) SpO2:  [96 %-97 %] 96 % (02/17 1000) Weight:  [80.5 kg] 80.5 kg (02/17 0538)     Intake/Output from previous day: 02/16 0701 - 02/17 0700 In: 417 [P.O.:417] Out: 475 [Urine:475] Intake/Output this shift: Total I/O In: 240 [P.O.:240] Out: -   General appearance: alert, cooperative, and no distress Neurologic: intact Heart: Regular rate and rhythm, no significant arrhythmias Lungs: Normal work of breathing with oxygen saturation 96% on room air   Lab Results: Recent Labs    12/15/22 0144 12/16/22 0050  WBC 13.5* 11.7*  HGB 12.9* 12.0*  HCT 37.3* 34.7*  PLT 314 291   BMET:  Recent Labs    12/14/22 1530 12/15/22 1010  NA 132* 139  K 3.5 4.3  CL 102 109  CO2 20* 23  GLUCOSE 139* 94  BUN 14 16  CREATININE 0.97 0.98  CALCIUM 8.9 9.1    PT/INR:  Recent Labs    12/14/22 1530  LABPROT 16.5*  INR 1.3*   ABG No results found for: "PHART", "HCO3", "TCO2", "ACIDBASEDEF", "O2SAT" CBG (last 3)  No results for input(s): "GLUCAP" in the last 72 hours.  Assessment/Plan: S/P Procedure(s) (LRB): CORONARY ANGIOGRAPHY (N/A) INTRAVASCULAR PRESSURE WIRE/FFR STUDY (N/A) -72 year old male presenting with acute ST elevation myocardial infarction that was  supported with medical therapy.  He underwent left heart catheterization yesterday demonstrating three-vessel coronary artery disease.  Echocardiogram performed yesterday shows left ventricular ejection fraction of 50 to 55% with akinesis of the mid to distal septum.  There is left ventricular hypertrophy.  There were no significant valvular abnormalities.  Plan for coronary bypass grafting Monday morning 12/18/2022 by Dr. Kipp Brood.  Plans for surgery were discussed with Mr. Peloso and questions were answered.   LOS: 2 days    Antony Odea, PA-C [phone] 12/16/2022

## 2022-12-16 NOTE — Progress Notes (Signed)
ANTICOAGULATION CONSULT NOTE   Pharmacy Consult for heparin Indication: 3V CAD  Not on File  Patient Measurements: Height: 5' 11"$  (180.3 cm) Weight: 80.5 kg (177 lb 6.4 oz) IBW/kg (Calculated) : 75.3 Heparin Dosing Weight: TBW  Vital Signs: Temp: 97.9 F (36.6 C) (02/17 0538) Temp Source: Oral (02/17 0538) BP: 108/61 (02/17 0538) Pulse Rate: 67 (02/17 0538)  Labs: Recent Labs    12/14/22 1410 12/14/22 1530 12/14/22 2142 12/15/22 0144 12/15/22 1010 12/15/22 2054 12/16/22 0050 12/16/22 0709  HGB 13.0  --   --  12.9*  --   --  12.0*  --   HCT 39.5  --   --  37.3*  --   --  34.7*  --   PLT 367  --   --  314  --   --  291  --   APTT  --  >200*  --   --   --   --   --   --   LABPROT  --  16.5*  --   --   --   --   --   --   INR  --  1.3*  --   --   --   --   --   --   HEPARINUNFRC  --   --    < > 0.22* 0.17* 0.30  --  0.36  CREATININE 0.95 0.97  --   --  0.98  --   --   --   TROPONINIHS 44* 977*  --   --   --   --   --   --    < > = values in this interval not displayed.     Estimated Creatinine Clearance: 72.6 mL/min (by C-G formula based on SCr of 0.98 mg/dL).   Medical History: Past Medical History:  Diagnosis Date   GERD (gastroesophageal reflux disease)    Tobacco use     Medications:  Medications Prior to Admission  Medication Sig Dispense Refill Last Dose   aspirin 81 MG EC tablet Take 81 mg by mouth daily.   12/14/2022   Multiple Vitamin (MULTIVITAMIN) tablet Take 1 tablet by mouth daily.   12/14/2022   NIACIN PO Take by mouth. Otc one daily   12/14/2022   omeprazole (PRILOSEC) 40 MG capsule Take 1 capsule (40 mg total) by mouth daily. 30 capsule 11     Assessment: Pharmacy consulted to dose heparin in patient with chest pain/ACS.  Patient is not on anticoagulation prior to admission.  Heparin level today remains therapeutic at 0.36, on 1500 units/hr. Hgb 12, plt 291--stable. No line issues or signs/symptoms of bleeding reported.   Goal of Therapy:   Heparin level 0.3-0.7 units/ml Monitor platelets by anticoagulation protocol: Yes   Plan:  Continue heparin at 1500 units/hr  Monitor daily HL, CBC, and for s/sx of bleeding  F/u CVTS consult  Billey Gosling, PharmD PGY1 Pharmacy Resident 2/17/20248:42 AM

## 2022-12-16 NOTE — Progress Notes (Signed)
Rounding Note    Patient Name: Nicholas Dominguez Date of Encounter: 12/16/2022  [facility] Cardiologist: None   Subjective   At the time of my visit, the patient had just walked with PT and developed chest pain. He had received 2 SL NTG and chest pain had resolved by my departure.  Inpatient Medications    Scheduled Meds:  aspirin  81 mg Oral Daily   atorvastatin  80 mg Oral Daily   [START ON 12/18/2022] epinephrine  0-10 mcg/min Intravenous To OR   [START ON 12/18/2022] heparin sodium (porcine) 2,500 Units, papaverine 30 mg in electrolyte-A (PLASMALYTE-A PH 7.4) 500 mL irrigation   Irrigation To OR   [START ON 12/18/2022] insulin   Intravenous To OR   [START ON 12/18/2022] [facility] Blood Cardioplegia vial (lidocaine/magnesium/mannitol 0.26g-4g-6.4g)   Intracoronary To OR   metoprolol tartrate  12.5 mg Oral BID   [START ON 12/18/2022] phenylephrine  30-200 mcg/min Intravenous To OR   [START ON 12/18/2022] potassium chloride  80 mEq Other To OR   sodium chloride flush  3 mL Intravenous Q12H   [START ON 12/18/2022] tranexamic acid  15 mg/kg Intravenous To OR   [START ON 12/18/2022] tranexamic acid  2 mg/kg Intracatheter To OR   Continuous Infusions:  sodium chloride     [START ON 12/18/2022]  ceFAZolin (ANCEF) IV     [START ON 12/18/2022]  ceFAZolin (ANCEF) IV     [START ON 12/18/2022] dexmedetomidine     [START ON 12/18/2022] heparin 30,000 units/NS 1000 mL solution for CELLSAVER     heparin 1,500 Units/hr (12/16/22 1002)   [START ON 12/18/2022] milrinone     [START ON 12/18/2022] nitroGLYCERIN     [START ON 12/18/2022] norepinephrine     [START ON 12/18/2022] tranexamic acid (CYKLOKAPRON) 2,500 mg in sodium chloride 0.9 % 250 mL (10 mg/mL) infusion     [START ON 12/18/2022] vancomycin     PRN Meds: sodium chloride, acetaminophen, nitroGLYCERIN, ondansetron (ZOFRAN) IV, sodium chloride flush   Vital Signs    Vitals:   12/16/22 0538 12/16/22 1000 12/16/22 1100 12/16/22 1124   BP: 108/61 117/75 111/82 126/79  Pulse: 67 67    Resp: 17 18    Temp: 97.9 F (36.6 C) 98.5 F (36.9 C)    TempSrc: Oral Oral    SpO2: 97% 96%    Weight: 80.5 kg     Height:        Intake/Output Summary (Last 24 hours) at 12/16/2022 1127 Last data filed at 12/16/2022 0900 Gross per 24 hour  Intake 417 ml  Output --  Net 417 ml      12/16/2022    5:38 AM 12/15/2022    5:21 AM 12/14/2022    5:04 PM  Last 3 Weights  Weight (lbs) 177 lb 6.4 oz 176 lb 14.4 oz 182 lb 12.2 oz  Weight (kg) 80.468 kg 80.241 kg 82.9 kg      Telemetry    Sinus rhythm without significant arrhythmia - Personally Reviewed  ECG    Normal sinus rhythm with age undetermined septal infarct pattern, nonspecific ST abnormality, no ST elevation - Personally Reviewed  Physical Exam  Alert, oriented, no distress GEN: No acute distress.   Neck: No JVD Cardiac: RRR, no murmurs, rubs, or gallops.  Respiratory: Clear to auscultation bilaterally. GI: Soft, nontender, non-distended  MS: No edema; No deformity. Neuro:  Nonfocal  Psych: Normal affect   Labs    High Sensitivity Troponin:   Recent Labs  Lab 12/14/22 1410 12/14/22 1530  TROPONINIHS 44* 977*     Chemistry Recent Labs  Lab 12/14/22 1410 12/14/22 1530 12/15/22 1010  NA 134* 132* 139  K 3.7 3.5 4.3  CL 103 102 109  CO2 23 20* 23  GLUCOSE 144* 139* 94  BUN 16 14 16  $ CREATININE 0.95 0.97 0.98  CALCIUM 9.3 8.9 9.1  PROT  --  6.0*  --   ALBUMIN  --  3.5  --   AST  --  31  --   ALT  --  9  --   ALKPHOS  --  76  --   BILITOT  --  1.1  --   GFRNONAA >60 >60 >60  ANIONGAP 8 10 7    $ Lipids  Recent Labs  Lab 12/14/22 1530  CHOL 177  TRIG 36  HDL 55  LDLCALC 115*  CHOLHDL 3.2    Hematology Recent Labs  Lab 12/14/22 1410 12/15/22 0144 12/16/22 0050  WBC 9.5 13.5* 11.7*  RBC 4.03* 3.86* 3.62*  HGB 13.0 12.9* 12.0*  HCT 39.5 37.3* 34.7*  MCV 98.0 96.6 95.9  MCH 32.3 33.4 33.1  MCHC 32.9 34.6 34.6  RDW 13.8 14.0 13.9   PLT 367 314 291   Thyroid No results for input(s): "TSH", "FREET4" in the last 168 hours.  BNPNo results for input(s): "BNP", "PROBNP" in the last 168 hours.  DDimer No results for input(s): "DDIMER" in the last 168 hours.   Radiology    ECHOCARDIOGRAM COMPLETE  Result Date: 12/15/2022    ECHOCARDIOGRAM REPORT   Patient Name:   Nicholas Dominguez Date of Exam: 12/15/2022 Medical Rec #:  [id number]     Height:       71.0 in Accession #:    [id number]    Weight:       176.9 lb Date of Birth:  1951-06-19     BSA:          2.001 m Patient Age:    72 years      BP:           119/71 mmHg Patient Gender: M             HR:           72 bpm. Exam Location:  Inpatient Procedure: 2D Echo, Cardiac Doppler and Color Doppler Indications:    CAD of native vessel  History:        Patient has no prior history of Echocardiogram examinations.  Sonographer:    Phineas Douglas Referring Phys: Lucile Crater LIGHTFOOT IMPRESSIONS  1. Left ventricular ejection fraction, by estimation, is 50 to 55%. The left ventricle has low normal function. The left ventricle has no regional wall motion abnormalities. There is moderate left ventricular hypertrophy. Left ventricular diastolic parameters are consistent with Grade I diastolic dysfunction (impaired relaxation).  2. Right ventricular systolic function is normal. The right ventricular size is normal. There is normal pulmonary artery systolic pressure. The estimated right ventricular systolic pressure is 0000000 mmHg.  3. The mitral valve is normal in structure. Trivial mitral valve regurgitation. No evidence of mitral stenosis.  4. The aortic valve is normal in structure. Aortic valve regurgitation is not visualized. No aortic stenosis is present.  5. The inferior vena cava is dilated in size with >50% respiratory variability, suggesting right atrial pressure of 8 mmHg. FINDINGS  Left Ventricle: Left ventricular ejection fraction, by estimation, is 50 to 55%. The left ventricle has low normal  function. The  left ventricle has no regional wall motion abnormalities. The left ventricular internal cavity size was normal in size. There is moderate left ventricular hypertrophy. Left ventricular diastolic parameters are consistent with Grade I diastolic dysfunction (impaired relaxation).  LV Wall Scoring: The mid and distal anterior septum is akinetic. Right Ventricle: The right ventricular size is normal. No increase in right ventricular wall thickness. Right ventricular systolic function is normal. There is normal pulmonary artery systolic pressure. The tricuspid regurgitant velocity is 2.10 m/s, and  with an assumed right atrial pressure of 8 mmHg, the estimated right ventricular systolic pressure is 0000000 mmHg. Left Atrium: Left atrial size was normal in size. Right Atrium: Right atrial size was normal in size. Pericardium: There is no evidence of pericardial effusion. Mitral Valve: The mitral valve is normal in structure. Trivial mitral valve regurgitation. No evidence of mitral valve stenosis. Tricuspid Valve: The tricuspid valve is normal in structure. Tricuspid valve regurgitation is trivial. No evidence of tricuspid stenosis. Aortic Valve: The aortic valve is normal in structure. Aortic valve regurgitation is not visualized. No aortic stenosis is present. Pulmonic Valve: The pulmonic valve was normal in structure. Pulmonic valve regurgitation is not visualized. No evidence of pulmonic stenosis. Aorta: The aortic root is normal in size and structure. Venous: The inferior vena cava is dilated in size with greater than 50% respiratory variability, suggesting right atrial pressure of 8 mmHg. IAS/Shunts: No atrial level shunt detected by color flow Doppler.  LEFT VENTRICLE PLAX 2D LVIDd:         4.40 cm      Diastology LVIDs:         3.40 cm      LV e' medial:    7.72 cm/s LV PW:         1.40 cm      LV E/e' medial:  7.3 LV IVS:        1.30 cm      LV e' lateral:   11.40 cm/s LVOT diam:     2.20 cm      LV  E/e' lateral: 5.0 LV SV:         71 LV SV Index:   35 LVOT Area:     3.80 cm  LV Volumes (MOD) LV vol d, MOD A2C: 142.0 ml LV vol d, MOD A4C: 139.0 ml LV vol s, MOD A2C: 65.3 ml LV vol s, MOD A4C: 61.8 ml LV SV MOD A2C:     76.7 ml LV SV MOD A4C:     139.0 ml LV SV MOD BP:      79.2 ml RIGHT VENTRICLE             IVC RV Basal diam:  3.80 cm     IVC diam: 2.60 cm RV S prime:     19.80 cm/s TAPSE (M-mode): 3.0 cm LEFT ATRIUM             Index        RIGHT ATRIUM           Index LA diam:        3.40 cm 1.70 cm/m   RA Area:     15.00 cm LA Vol (A2C):   67.6 ml 33.78 ml/m  RA Volume:   37.20 ml  18.59 ml/m LA Vol (A4C):   57.2 ml 28.58 ml/m LA Biplane Vol: 62.2 ml 31.08 ml/m  AORTIC VALVE LVOT Vmax:   88.60 cm/s LVOT Vmean:  60.500 cm/s LVOT VTI:  0.186 m  AORTA Ao Root diam: 3.70 cm Ao Asc diam:  3.60 cm MITRAL VALVE               TRICUSPID VALVE MV Area (PHT): 2.38 cm    TV Peak grad:   17.6 mmHg MV Decel Time: 319 msec    TV Vmax:        2.10 m/s MV E velocity: 56.60 cm/s  TR Peak grad:   17.6 mmHg MV A velocity: 57.10 cm/s  TR Vmax:        210.00 cm/s MV E/A ratio:  0.99                            SHUNTS                            Systemic VTI:  0.19 m                            Systemic Diam: 2.20 cm Candee Furbish MD Electronically signed by Candee Furbish MD Signature Date/Time: 12/15/2022/11:43:28 AM    Final    CARDIAC CATHETERIZATION  Result Date: 12/14/2022   1st Diag lesion is 70% stenosed.   Prox LAD lesion is 80% stenosed.   2nd Mrg lesion is 80% stenosed.   Prox RCA to Mid RCA lesion is 55% stenosed. 1.  Multivessel disease consisting of proximal LAD, first diagonal, and second obtuse marginal lesions.  There is moderate disease of the mid right coronary artery which is not significant with an RFR of 0.93. Recommendation: Given that the patient has TIMI-3 flow in all vessels and is chest pain-free, the patient will be treated medically and referred for surgical revascularization.   DG Chest Port 1  View  Result Date: 12/14/2022 CLINICAL DATA:  Short of breath EXAM: PORTABLE CHEST 1 VIEW COMPARISON:  None Available. FINDINGS: Normal mediastinum and cardiac silhouette. Chronic central bronchitic markings. Normal pulmonary vasculature. No effusion, infiltrate, or pneumothorax. IMPRESSION: Chronic bronchitic markings. No acute findings. Electronically Signed   By: Suzy Bouchard M.D.   On: 12/14/2022 14:48    Cardiac Studies   Echo and cardiac catheterization findings reviewed personally  Patient Profile     72 y.o. male presents with aborted anterior [name], found to have critical LAD/diagonal stenosis, referred for multivessel CABG with TIMI-3 flow in all vessels  Assessment & Plan    1.  Aborted anterior [name]: Patient clinically stable with a brief episode of chest discomfort earlier today after walking with PT resolved with SL NTG (x2).  Will continue IV heparin until he goes to the operating room.  If he develops recurrent resting angina, would start IV nitroglycerin and may require more emergent surgery.  Otherwise continue current medical therapy as the patient remains clinically stable with no angina at present.   2.  Mixed hyperlipidemia: Started on high intensity statin drug  Disposition: Keep activity to a minimum, continue IV heparin, plans for multivessel CABG on Monday per Dr. Kipp Brood.  For questions or updates, please contact [place] Please consult [email or web address] for contact info under        Signed, Melida Quitter, MD  12/16/2022, 11:27 AM

## 2022-12-16 NOTE — Significant Event (Addendum)
Pt experiencing intermittent pain, resolved with Nitro Sublingual, MD notified and rounded. Pain returned radiating into jaw and left arm Crenshaw Md made aware with orders to start Nitro Drip at 94mg/kin/min, Pain has since then subsided, with orders for bed rest bathroom privileges.

## 2022-12-16 NOTE — Progress Notes (Signed)
CARDIAC REHAB PHASE I   PRE:  Rate/Rhythm: 77 NSR  BP:  Sitting: 117/75      SaO2: 96 RA  MODE:  Ambulation: 470 ft   AD:  None  POST:  Rate/Rhythm: 88 NSR  BP:  Sitting: 122/74      SaO2: 96 RA  Pt amb with standby assistance, pt denies CP and SOB during amb and was returned to room w/o complaint.  Pt walked well   Nicholas Dominguez  10:18 AM 12/16/2022    Service time is from 0952 to 1019.

## 2022-12-17 ENCOUNTER — Inpatient Hospital Stay (HOSPITAL_COMMUNITY): Payer: Medicare Other

## 2022-12-17 DIAGNOSIS — Z01810 Encounter for preprocedural cardiovascular examination: Secondary | ICD-10-CM

## 2022-12-17 DIAGNOSIS — I2102 ST elevation (STEMI) myocardial infarction involving left anterior descending coronary artery: Secondary | ICD-10-CM | POA: Diagnosis not present

## 2022-12-17 LAB — BASIC METABOLIC PANEL
Anion gap: 7 (ref 5–15)
BUN: 20 mg/dL (ref 8–23)
CO2: 25 mmol/L (ref 22–32)
Calcium: 9.2 mg/dL (ref 8.9–10.3)
Chloride: 105 mmol/L (ref 98–111)
Creatinine, Ser: 1.07 mg/dL (ref 0.61–1.24)
GFR, Estimated: >60 mL/min (ref >60)
Glucose, Bld: 103 mg/dL — ABNORMAL HIGH (ref 70–99)
Potassium: 4.2 mmol/L (ref 3.5–5.1)
Sodium: 137 mmol/L (ref 135–145)

## 2022-12-17 LAB — CBC
HCT: 35.2 % — ABNORMAL LOW (ref 39.0–52.0)
Hemoglobin: 11.6 g/dL — ABNORMAL LOW (ref 13.0–17.0)
MCH: 32.4 pg (ref 26.0–34.0)
MCHC: 33 g/dL (ref 30.0–36.0)
MCV: 98.3 fL (ref 80.0–100.0)
Platelets: 294 10*3/uL (ref 150–400)
RBC: 3.58 MIL/uL — ABNORMAL LOW (ref 4.22–5.81)
RDW: 13.7 % (ref 11.5–15.5)
WBC: 11.5 10*3/uL — ABNORMAL HIGH (ref 4.0–10.5)
nRBC: 0 % (ref 0.0–0.2)

## 2022-12-17 LAB — HEPARIN LEVEL (UNFRACTIONATED): Heparin Unfractionated: 0.4 IU/mL (ref 0.30–0.70)

## 2022-12-17 LAB — LIPOPROTEIN A (LPA): Lipoprotein (a): 13.8 nmol/L (ref <75.0)

## 2022-12-17 LAB — SARS CORONAVIRUS 2 (TAT 6-24 HRS): SARS Coronavirus 2: NEGATIVE

## 2022-12-17 MED ORDER — CHLORHEXIDINE GLUCONATE CLOTH 2 % EX PADS
6.0000 | MEDICATED_PAD | Freq: Once | CUTANEOUS | Status: AC
  Administered 2022-12-17: 6 via TOPICAL

## 2022-12-17 MED ORDER — METOPROLOL TARTRATE 12.5 MG HALF TABLET
12.5000 mg | ORAL_TABLET | Freq: Once | ORAL | Status: AC
  Administered 2022-12-18: 12.5 mg via ORAL
  Filled 2022-12-17: qty 1

## 2022-12-17 MED ORDER — TEMAZEPAM 15 MG PO CAPS: 15.0000 mg | ORAL_CAPSULE | Freq: Once | ORAL | Status: DC | PRN

## 2022-12-17 MED ORDER — CHLORHEXIDINE GLUCONATE 0.12 % MT SOLN
15.0000 mL | Freq: Once | OROMUCOSAL | Status: AC
  Administered 2022-12-18: 15 mL via OROMUCOSAL
  Filled 2022-12-17: qty 15

## 2022-12-17 MED ORDER — CHLORHEXIDINE GLUCONATE CLOTH 2 % EX PADS
6.0000 | MEDICATED_PAD | Freq: Once | CUTANEOUS | Status: AC
  Administered 2022-12-18: 6 via TOPICAL

## 2022-12-17 MED ORDER — BISACODYL 5 MG PO TBEC: 5.0000 mg | DELAYED_RELEASE_TABLET | Freq: Once | ORAL | Status: DC

## 2022-12-17 NOTE — Progress Notes (Signed)
Rounding Note    Patient Name: Nicholas Dominguez Date of Encounter: 12/17/2022  [facility] Cardiologist: None   Subjective   Chest pain occurred at rest yesterday after my visit. NTG drip was started. He has not had any pain since.  Inpatient Medications    Scheduled Meds:  aspirin  81 mg Oral Daily   atorvastatin  80 mg Oral Daily   [START ON 12/18/2022] epinephrine  0-10 mcg/min Intravenous To OR   [START ON 12/18/2022] heparin sodium (porcine) 2,500 Units, papaverine 30 mg in electrolyte-A (PLASMALYTE-A PH 7.4) 500 mL irrigation   Irrigation To OR   [START ON 12/18/2022] insulin   Intravenous To OR   [START ON 12/18/2022] [facility] Blood Cardioplegia vial (lidocaine/magnesium/mannitol 0.26g-4g-6.4g)   Intracoronary To OR   metoprolol tartrate  12.5 mg Oral BID   [START ON 12/18/2022] phenylephrine  30-200 mcg/min Intravenous To OR   [START ON 12/18/2022] potassium chloride  80 mEq Other To OR   sodium chloride flush  3 mL Intravenous Q12H   [START ON 12/18/2022] tranexamic acid  15 mg/kg Intravenous To OR   [START ON 12/18/2022] tranexamic acid  2 mg/kg Intracatheter To OR   Continuous Infusions:  sodium chloride     [START ON 12/18/2022]  ceFAZolin (ANCEF) IV     [START ON 12/18/2022]  ceFAZolin (ANCEF) IV     [START ON 12/18/2022] dexmedetomidine     [START ON 12/18/2022] heparin 30,000 units/NS 1000 mL solution for CELLSAVER     heparin 1,500 Units/hr (12/17/22 0301)   [START ON 12/18/2022] milrinone     [START ON 12/18/2022] nitroGLYCERIN     nitroGLYCERIN 3 mcg/min (12/16/22 1211)   [START ON 12/18/2022] norepinephrine     [START ON 12/18/2022] tranexamic acid (CYKLOKAPRON) 2,500 mg in sodium chloride 0.9 % 250 mL (10 mg/mL) infusion     [START ON 12/18/2022] vancomycin     PRN Meds: sodium chloride, acetaminophen, nitroGLYCERIN, ondansetron (ZOFRAN) IV, sodium chloride flush   Vital Signs    Vitals:   12/16/22 1209 12/16/22 1500 12/16/22 1930 12/17/22 0505  BP:  108/64 (!) 122/50 (!) 109/53 (!) 102/56  Pulse:   67 80  Resp:   18 18  Temp:   97.9 F (36.6 C) 97.9 F (36.6 C)  TempSrc:   Oral Oral  SpO2:   96% 96%  Weight:    80.8 kg  Height:        Intake/Output Summary (Last 24 hours) at 12/17/2022 0650 Last data filed at 12/17/2022 0507 Gross per 24 hour  Intake 1422.97 ml  Output 1100 ml  Net 322.97 ml      12/17/2022    5:05 AM 12/16/2022    5:38 AM 12/15/2022    5:21 AM  Last 3 Weights  Weight (lbs) 178 lb 3.2 oz 177 lb 6.4 oz 176 lb 14.4 oz  Weight (kg) 80.831 kg 80.468 kg 80.241 kg      Telemetry    Sinus rhythm without significant arrhythmia - Personally Reviewed  ECG    Normal sinus rhythm with age undetermined septal infarct pattern, nonspecific ST abnormality, no ST elevation - Personally Reviewed  Physical Exam  Alert, oriented, no distress GEN: No acute distress.   Neck: No JVD Cardiac: RRR, no murmurs, rubs, or gallops.  Respiratory: Clear to auscultation bilaterally. GI: Soft, nontender, non-distended  MS: No edema; No deformity. Neuro:  Nonfocal  Psych: Normal affect   Labs    High Sensitivity Troponin:   Recent Labs  Lab 12/14/22 1410 12/14/22 1530  TROPONINIHS 44* 977*     Chemistry Recent Labs  Lab 12/14/22 1410 12/14/22 1530 12/15/22 1010  NA 134* 132* 139  K 3.7 3.5 4.3  CL 103 102 109  CO2 23 20* 23  GLUCOSE 144* 139* 94  BUN 16 14 16  $ CREATININE 0.95 0.97 0.98  CALCIUM 9.3 8.9 9.1  PROT  --  6.0*  --   ALBUMIN  --  3.5  --   AST  --  31  --   ALT  --  9  --   ALKPHOS  --  76  --   BILITOT  --  1.1  --   GFRNONAA >60 >60 >60  ANIONGAP 8 10 7    $ Lipids  Recent Labs  Lab 12/14/22 1530  CHOL 177  TRIG 36  HDL 55  LDLCALC 115*  CHOLHDL 3.2    Hematology Recent Labs  Lab 12/14/22 1410 12/15/22 0144 12/16/22 0050  WBC 9.5 13.5* 11.7*  RBC 4.03* 3.86* 3.62*  HGB 13.0 12.9* 12.0*  HCT 39.5 37.3* 34.7*  MCV 98.0 96.6 95.9  MCH 32.3 33.4 33.1  MCHC 32.9 34.6 34.6   RDW 13.8 14.0 13.9  PLT 367 314 291   Thyroid No results for input(s): "TSH", "FREET4" in the last 168 hours.  BNPNo results for input(s): "BNP", "PROBNP" in the last 168 hours.  DDimer No results for input(s): "DDIMER" in the last 168 hours.   Radiology    ECHOCARDIOGRAM COMPLETE  Result Date: 12/15/2022    ECHOCARDIOGRAM REPORT   Patient Name:   Dieter Huyck Date of Exam: 12/15/2022 Medical Rec #:  [id number]     Height:       71.0 in Accession #:    [id number]    Weight:       176.9 lb Date of Birth:  23-May-1951     BSA:          2.001 m Patient Age:    9 years      BP:           119/71 mmHg Patient Gender: M             HR:           72 bpm. Exam Location:  Inpatient Procedure: 2D Echo, Cardiac Doppler and Color Doppler Indications:    CAD of native vessel  History:        Patient has no prior history of Echocardiogram examinations.  Sonographer:    Phineas Douglas Referring Phys: Lucile Crater LIGHTFOOT IMPRESSIONS  1. Left ventricular ejection fraction, by estimation, is 50 to 55%. The left ventricle has low normal function. The left ventricle has no regional wall motion abnormalities. There is moderate left ventricular hypertrophy. Left ventricular diastolic parameters are consistent with Grade I diastolic dysfunction (impaired relaxation).  2. Right ventricular systolic function is normal. The right ventricular size is normal. There is normal pulmonary artery systolic pressure. The estimated right ventricular systolic pressure is 0000000 mmHg.  3. The mitral valve is normal in structure. Trivial mitral valve regurgitation. No evidence of mitral stenosis.  4. The aortic valve is normal in structure. Aortic valve regurgitation is not visualized. No aortic stenosis is present.  5. The inferior vena cava is dilated in size with >50% respiratory variability, suggesting right atrial pressure of 8 mmHg. FINDINGS  Left Ventricle: Left ventricular ejection fraction, by estimation, is 50 to 55%. The left ventricle  has low normal function. The  left ventricle has no regional wall motion abnormalities. The left ventricular internal cavity size was normal in size. There is moderate left ventricular hypertrophy. Left ventricular diastolic parameters are consistent with Grade I diastolic dysfunction (impaired relaxation).  LV Wall Scoring: The mid and distal anterior septum is akinetic. Right Ventricle: The right ventricular size is normal. No increase in right ventricular wall thickness. Right ventricular systolic function is normal. There is normal pulmonary artery systolic pressure. The tricuspid regurgitant velocity is 2.10 m/s, and  with an assumed right atrial pressure of 8 mmHg, the estimated right ventricular systolic pressure is 0000000 mmHg. Left Atrium: Left atrial size was normal in size. Right Atrium: Right atrial size was normal in size. Pericardium: There is no evidence of pericardial effusion. Mitral Valve: The mitral valve is normal in structure. Trivial mitral valve regurgitation. No evidence of mitral valve stenosis. Tricuspid Valve: The tricuspid valve is normal in structure. Tricuspid valve regurgitation is trivial. No evidence of tricuspid stenosis. Aortic Valve: The aortic valve is normal in structure. Aortic valve regurgitation is not visualized. No aortic stenosis is present. Pulmonic Valve: The pulmonic valve was normal in structure. Pulmonic valve regurgitation is not visualized. No evidence of pulmonic stenosis. Aorta: The aortic root is normal in size and structure. Venous: The inferior vena cava is dilated in size with greater than 50% respiratory variability, suggesting right atrial pressure of 8 mmHg. IAS/Shunts: No atrial level shunt detected by color flow Doppler.  LEFT VENTRICLE PLAX 2D LVIDd:         4.40 cm      Diastology LVIDs:         3.40 cm      LV e' medial:    7.72 cm/s LV PW:         1.40 cm      LV E/e' medial:  7.3 LV IVS:        1.30 cm      LV e' lateral:   11.40 cm/s LVOT diam:      2.20 cm      LV E/e' lateral: 5.0 LV SV:         71 LV SV Index:   35 LVOT Area:     3.80 cm  LV Volumes (MOD) LV vol d, MOD A2C: 142.0 ml LV vol d, MOD A4C: 139.0 ml LV vol s, MOD A2C: 65.3 ml LV vol s, MOD A4C: 61.8 ml LV SV MOD A2C:     76.7 ml LV SV MOD A4C:     139.0 ml LV SV MOD BP:      79.2 ml RIGHT VENTRICLE             IVC RV Basal diam:  3.80 cm     IVC diam: 2.60 cm RV S prime:     19.80 cm/s TAPSE (M-mode): 3.0 cm LEFT ATRIUM             Index        RIGHT ATRIUM           Index LA diam:        3.40 cm 1.70 cm/m   RA Area:     15.00 cm LA Vol (A2C):   67.6 ml 33.78 ml/m  RA Volume:   37.20 ml  18.59 ml/m LA Vol (A4C):   57.2 ml 28.58 ml/m LA Biplane Vol: 62.2 ml 31.08 ml/m  AORTIC VALVE LVOT Vmax:   88.60 cm/s LVOT Vmean:  60.500 cm/s LVOT VTI:  0.186 m  AORTA Ao Root diam: 3.70 cm Ao Asc diam:  3.60 cm MITRAL VALVE               TRICUSPID VALVE MV Area (PHT): 2.38 cm    TV Peak grad:   17.6 mmHg MV Decel Time: 319 msec    TV Vmax:        2.10 m/s MV E velocity: 56.60 cm/s  TR Peak grad:   17.6 mmHg MV A velocity: 57.10 cm/s  TR Vmax:        210.00 cm/s MV E/A ratio:  0.99                            SHUNTS                            Systemic VTI:  0.19 m                            Systemic Diam: 2.20 cm Candee Furbish MD Electronically signed by Candee Furbish MD Signature Date/Time: 12/15/2022/11:43:28 AM    Final     Cardiac Studies   Echo and cardiac catheterization findings reviewed personally  Patient Profile     72 y.o. male presents with aborted anterior [name], found to have critical LAD/diagonal stenosis, referred for multivessel CABG with TIMI-3 flow in all vessels  Assessment & Plan    1.  Aborted anterior [name]: Patient clinically stable with a brief episode of chest discomfort earlier today after walking with PT resolved with SL NTG (x2).  Will continue IV heparin and IV nitroglycerine until he goes to the operating room.  Otherwise continue current medical therapy as the  patient remains clinically stable with no angina at present.   2.  Mixed hyperlipidemia: Started on high intensity statin drug  Disposition: Keep activity to a minimum, continue IV heparin, plans for multivessel CABG on Monday per Dr. Kipp Brood.  For questions or updates, please contact [place] Please consult [email or web address] for contact info under        Signed, Melida Quitter, MD  12/17/2022, 6:50 AM

## 2022-12-17 NOTE — Progress Notes (Signed)
ANTICOAGULATION CONSULT NOTE   Pharmacy Consult for heparin Indication: 3V CAD  Not on File  Patient Measurements: Height: 5' 11"$  (180.3 cm) Weight: 80.8 kg (178 lb 3.2 oz) IBW/kg (Calculated) : 75.3 Heparin Dosing Weight: TBW  Vital Signs: Temp: 97.9 F (36.6 C) (02/18 0505) Temp Source: Oral (02/18 0505) BP: 102/56 (02/18 0505) Pulse Rate: 80 (02/18 0505)  Labs: Recent Labs    12/14/22 1410 12/14/22 1530 12/14/22 2142 12/15/22 0144 12/15/22 1010 12/15/22 2054 12/16/22 0050 12/16/22 0709 12/17/22 0656  HGB 13.0  --   --  12.9*  --   --  12.0*  --  11.6*  HCT 39.5  --   --  37.3*  --   --  34.7*  --  35.2*  PLT 367  --   --  314  --   --  291  --  294  APTT  --  >200*  --   --   --   --   --   --   --   LABPROT  --  16.5*  --   --   --   --   --   --   --   INR  --  1.3*  --   --   --   --   --   --   --   HEPARINUNFRC  --   --    < > 0.22* 0.17* 0.30  --  0.36 0.40  CREATININE 0.95 0.97  --   --  0.98  --   --   --  1.07  TROPONINIHS 44* 977*  --   --   --   --   --   --   --    < > = values in this interval not displayed.     Estimated Creatinine Clearance: 66.5 mL/min (by C-G formula based on SCr of 1.07 mg/dL).   Medical History: Past Medical History:  Diagnosis Date   GERD (gastroesophageal reflux disease)    Tobacco use     Medications:  Medications Prior to Admission  Medication Sig Dispense Refill Last Dose   aspirin 81 MG EC tablet Take 81 mg by mouth daily.   12/14/2022   Multiple Vitamin (MULTIVITAMIN) tablet Take 1 tablet by mouth daily.   12/14/2022   NIACIN PO Take by mouth. Otc one daily   12/14/2022   omeprazole (PRILOSEC) 40 MG capsule Take 1 capsule (40 mg total) by mouth daily. 30 capsule 11     Assessment: Pharmacy consulted to dose heparin in patient with chest pain/ACS.  Patient is not on anticoagulation prior to admission.  Heparin level today remains therapeutic at 0.40, on 1500 units/hr. Hgb 11.6, plt 294--stable. No line issues  or signs/symptoms of bleeding reported. Plan for multivessel CABG on 2/19.   Goal of Therapy:  Heparin level 0.3-0.7 units/ml Monitor platelets by anticoagulation protocol: Yes   Plan:  -Continue heparin at 1500 units/hr  -Monitor daily HL, CBC, and for s/sx of bleeding    Billey Gosling, PharmD PGY1 Pharmacy Resident 2/18/20249:08 AM

## 2022-12-17 NOTE — Progress Notes (Signed)
VASCULAR LAB    Pre CABG Dopplers have been performed.  See CV proc for preliminary results.   Khrista Braun, RVT 12/17/2022, 10:23 AM

## 2022-12-18 ENCOUNTER — Encounter (HOSPITAL_COMMUNITY)
Admission: EM | Disposition: A | Discharge status: Home / Self Care | Payer: Self-pay | Source: Home / Self Care | Attending: Thoracic Surgery (Cardiothoracic Vascular Surgery)

## 2022-12-18 ENCOUNTER — Inpatient Hospital Stay (HOSPITAL_COMMUNITY): Payer: Medicare Other

## 2022-12-18 ENCOUNTER — Encounter (HOSPITAL_COMMUNITY): Payer: Self-pay | Admitting: Internal Medicine

## 2022-12-18 DIAGNOSIS — J432 Centrilobular emphysema: Secondary | ICD-10-CM

## 2022-12-18 DIAGNOSIS — I2510 Atherosclerotic heart disease of native coronary artery without angina pectoris: Secondary | ICD-10-CM

## 2022-12-18 DIAGNOSIS — I252 Old myocardial infarction: Secondary | ICD-10-CM | POA: Diagnosis not present

## 2022-12-18 DIAGNOSIS — Z87891 Personal history of nicotine dependence: Secondary | ICD-10-CM

## 2022-12-18 DIAGNOSIS — Z951 Presence of aortocoronary bypass graft: Secondary | ICD-10-CM

## 2022-12-18 DIAGNOSIS — Z9911 Dependence on respirator [ventilator] status: Secondary | ICD-10-CM

## 2022-12-18 DIAGNOSIS — R911 Solitary pulmonary nodule: Secondary | ICD-10-CM

## 2022-12-18 HISTORY — PX: CORONARY ARTERY BYPASS GRAFT: SHX141

## 2022-12-18 HISTORY — PX: TEE WITHOUT CARDIOVERSION: SHX5443

## 2022-12-18 LAB — POCT I-STAT 7, (LYTES, BLD GAS, ICA,H+H)
Acid-base deficit: 1 mmol/L (ref 0.0–2.0)
Acid-base deficit: 3 mmol/L — ABNORMAL HIGH (ref 0.0–2.0)
Acid-base deficit: 4 mmol/L — ABNORMAL HIGH (ref 0.0–2.0)
Acid-base deficit: 3 mmol/L — ABNORMAL HIGH (ref 0.0–2.0)
Acid-base deficit: 4 mmol/L — ABNORMAL HIGH (ref 0.0–2.0)
Acid-base deficit: 4 mmol/L — ABNORMAL HIGH (ref 0.0–2.0)
Bicarbonate: 23.7 mmol/L (ref 20.0–28.0)
Bicarbonate: 22.8 mmol/L (ref 20.0–28.0)
Bicarbonate: 20.5 mmol/L (ref 20.0–28.0)
Bicarbonate: 23.1 mmol/L (ref 20.0–28.0)
Bicarbonate: 22.1 mmol/L (ref 20.0–28.0)
Bicarbonate: 22.1 mmol/L (ref 20.0–28.0)
Calcium, Ion: 1.27 mmol/L (ref 1.15–1.40)
Calcium, Ion: 1.1 mmol/L — ABNORMAL LOW (ref 1.15–1.40)
Calcium, Ion: 1.26 mmol/L (ref 1.15–1.40)
Calcium, Ion: 1.32 mmol/L (ref 1.15–1.40)
Calcium, Ion: 1.24 mmol/L (ref 1.15–1.40)
Calcium, Ion: 1.21 mmol/L (ref 1.15–1.40)
HCT: 35 % — ABNORMAL LOW (ref 39.0–52.0)
HCT: 27 % — ABNORMAL LOW (ref 39.0–52.0)
HCT: 24 % — ABNORMAL LOW (ref 39.0–52.0)
HCT: 27 % — ABNORMAL LOW (ref 39.0–52.0)
HCT: 27 % — ABNORMAL LOW (ref 39.0–52.0)
HCT: 28 % — ABNORMAL LOW (ref 39.0–52.0)
Hemoglobin: 11.9 g/dL — ABNORMAL LOW (ref 13.0–17.0)
Hemoglobin: 9.2 g/dL — ABNORMAL LOW (ref 13.0–17.0)
Hemoglobin: 8.2 g/dL — ABNORMAL LOW (ref 13.0–17.0)
Hemoglobin: 9.2 g/dL — ABNORMAL LOW (ref 13.0–17.0)
Hemoglobin: 9.2 g/dL — ABNORMAL LOW (ref 13.0–17.0)
Hemoglobin: 9.5 g/dL — ABNORMAL LOW (ref 13.0–17.0)
O2 Saturation: 100 %
O2 Saturation: 100 %
O2 Saturation: 100 %
O2 Saturation: 97 %
O2 Saturation: 98 %
O2 Saturation: 96 %
Patient temperature: 36.1
Patient temperature: 37.3
Patient temperature: 37.3
Potassium: 4 mmol/L (ref 3.5–5.1)
Potassium: 4.9 mmol/L (ref 3.5–5.1)
Potassium: 4.2 mmol/L (ref 3.5–5.1)
Potassium: 4.4 mmol/L (ref 3.5–5.1)
Potassium: 4.3 mmol/L (ref 3.5–5.1)
Potassium: 4.3 mmol/L (ref 3.5–5.1)
Sodium: 140 mmol/L (ref 135–145)
Sodium: 139 mmol/L (ref 135–145)
Sodium: 142 mmol/L (ref 135–145)
Sodium: 140 mmol/L (ref 135–145)
Sodium: 141 mmol/L (ref 135–145)
Sodium: 139 mmol/L (ref 135–145)
TCO2: 25 mmol/L (ref 22–32)
TCO2: 24 mmol/L (ref 22–32)
TCO2: 21 mmol/L — ABNORMAL LOW (ref 22–32)
TCO2: 24 mmol/L (ref 22–32)
TCO2: 23 mmol/L (ref 22–32)
TCO2: 23 mmol/L (ref 22–32)
pCO2 arterial: 40.5 mmHg (ref 32–48)
pCO2 arterial: 42.7 mmHg (ref 32–48)
pCO2 arterial: 32.9 mmHg (ref 32–48)
pCO2 arterial: 41.3 mmHg (ref 32–48)
pCO2 arterial: 42 mmHg (ref 32–48)
pCO2 arterial: 43.8 mmHg (ref 32–48)
pH, Arterial: 7.375 (ref 7.35–7.45)
pH, Arterial: 7.336 — ABNORMAL LOW (ref 7.35–7.45)
pH, Arterial: 7.403 (ref 7.35–7.45)
pH, Arterial: 7.352 (ref 7.35–7.45)
pH, Arterial: 7.331 — ABNORMAL LOW (ref 7.35–7.45)
pH, Arterial: 7.313 — ABNORMAL LOW (ref 7.35–7.45)
pO2, Arterial: 451 mmHg — ABNORMAL HIGH (ref 83–108)
pO2, Arterial: 463 mmHg — ABNORMAL HIGH (ref 83–108)
pO2, Arterial: 309 mmHg — ABNORMAL HIGH (ref 83–108)
pO2, Arterial: 95 mmHg (ref 83–108)
pO2, Arterial: 118 mmHg — ABNORMAL HIGH (ref 83–108)
pO2, Arterial: 91 mmHg (ref 83–108)

## 2022-12-18 LAB — POCT I-STAT, CHEM 8
BUN: 16 mg/dL (ref 8–23)
BUN: 15 mg/dL (ref 8–23)
BUN: 16 mg/dL (ref 8–23)
BUN: 16 mg/dL (ref 8–23)
BUN: 12 mg/dL (ref 8–23)
Calcium, Ion: 1.27 mmol/L (ref 1.15–1.40)
Calcium, Ion: 1.1 mmol/L — ABNORMAL LOW (ref 1.15–1.40)
Calcium, Ion: 1.27 mmol/L (ref 1.15–1.40)
Calcium, Ion: 1.14 mmol/L — ABNORMAL LOW (ref 1.15–1.40)
Calcium, Ion: 1.21 mmol/L (ref 1.15–1.40)
Chloride: 106 mmol/L (ref 98–111)
Chloride: 108 mmol/L (ref 98–111)
Chloride: 108 mmol/L (ref 98–111)
Chloride: 106 mmol/L (ref 98–111)
Chloride: 111 mmol/L (ref 98–111)
Creatinine, Ser: 0.8 mg/dL (ref 0.61–1.24)
Creatinine, Ser: 0.7 mg/dL (ref 0.61–1.24)
Creatinine, Ser: 0.7 mg/dL (ref 0.61–1.24)
Creatinine, Ser: 0.8 mg/dL (ref 0.61–1.24)
Creatinine, Ser: 0.6 mg/dL — ABNORMAL LOW (ref 0.61–1.24)
Glucose, Bld: 96 mg/dL (ref 70–99)
Glucose, Bld: 119 mg/dL — ABNORMAL HIGH (ref 70–99)
Glucose, Bld: 126 mg/dL — ABNORMAL HIGH (ref 70–99)
Glucose, Bld: 131 mg/dL — ABNORMAL HIGH (ref 70–99)
Glucose, Bld: 141 mg/dL — ABNORMAL HIGH (ref 70–99)
HCT: 34 % — ABNORMAL LOW (ref 39.0–52.0)
HCT: 31 % — ABNORMAL LOW (ref 39.0–52.0)
HCT: 33 % — ABNORMAL LOW (ref 39.0–52.0)
HCT: 28 % — ABNORMAL LOW (ref 39.0–52.0)
HCT: 22 % — ABNORMAL LOW (ref 39.0–52.0)
Hemoglobin: 11.6 g/dL — ABNORMAL LOW (ref 13.0–17.0)
Hemoglobin: 10.5 g/dL — ABNORMAL LOW (ref 13.0–17.0)
Hemoglobin: 11.2 g/dL — ABNORMAL LOW (ref 13.0–17.0)
Hemoglobin: 9.5 g/dL — ABNORMAL LOW (ref 13.0–17.0)
Hemoglobin: 7.5 g/dL — ABNORMAL LOW (ref 13.0–17.0)
Potassium: 4 mmol/L (ref 3.5–5.1)
Potassium: 4.3 mmol/L (ref 3.5–5.1)
Potassium: 6.6 mmol/L (ref 3.5–5.1)
Potassium: 5.6 mmol/L — ABNORMAL HIGH (ref 3.5–5.1)
Potassium: 4.1 mmol/L (ref 3.5–5.1)
Sodium: 141 mmol/L (ref 135–145)
Sodium: 137 mmol/L (ref 135–145)
Sodium: 142 mmol/L (ref 135–145)
Sodium: 138 mmol/L (ref 135–145)
Sodium: 143 mmol/L (ref 135–145)
TCO2: 25 mmol/L (ref 22–32)
TCO2: 25 mmol/L (ref 22–32)
TCO2: 25 mmol/L (ref 22–32)
TCO2: 25 mmol/L (ref 22–32)
TCO2: 20 mmol/L — ABNORMAL LOW (ref 22–32)

## 2022-12-18 LAB — CBC
HCT: 34.1 % — ABNORMAL LOW (ref 39.0–52.0)
HCT: 28.7 % — ABNORMAL LOW (ref 39.0–52.0)
HCT: 27.9 % — ABNORMAL LOW (ref 39.0–52.0)
Hemoglobin: 11 g/dL — ABNORMAL LOW (ref 13.0–17.0)
Hemoglobin: 9.3 g/dL — ABNORMAL LOW (ref 13.0–17.0)
Hemoglobin: 9.4 g/dL — ABNORMAL LOW (ref 13.0–17.0)
MCH: 32.2 pg (ref 26.0–34.0)
MCH: 32.3 pg (ref 26.0–34.0)
MCH: 33 pg (ref 26.0–34.0)
MCHC: 32.3 g/dL (ref 30.0–36.0)
MCHC: 32.4 g/dL (ref 30.0–36.0)
MCHC: 33.7 g/dL (ref 30.0–36.0)
MCV: 99.7 fL (ref 80.0–100.0)
MCV: 99.7 fL (ref 80.0–100.0)
MCV: 97.9 fL (ref 80.0–100.0)
Platelets: 286 10*3/uL (ref 150–400)
Platelets: 189 10*3/uL (ref 150–400)
Platelets: 214 10*3/uL (ref 150–400)
RBC: 3.42 MIL/uL — ABNORMAL LOW (ref 4.22–5.81)
RBC: 2.88 MIL/uL — ABNORMAL LOW (ref 4.22–5.81)
RBC: 2.85 MIL/uL — ABNORMAL LOW (ref 4.22–5.81)
RDW: 13.5 % (ref 11.5–15.5)
RDW: 13.4 % (ref 11.5–15.5)
RDW: 13.3 % (ref 11.5–15.5)
WBC: 10.6 10*3/uL — ABNORMAL HIGH (ref 4.0–10.5)
WBC: 15.4 10*3/uL — ABNORMAL HIGH (ref 4.0–10.5)
WBC: 18.4 10*3/uL — ABNORMAL HIGH (ref 4.0–10.5)
nRBC: 0 % (ref 0.0–0.2)
nRBC: 0 % (ref 0.0–0.2)
nRBC: 0 % (ref 0.0–0.2)

## 2022-12-18 LAB — BASIC METABOLIC PANEL
Anion gap: 6 (ref 5–15)
BUN: 15 mg/dL (ref 8–23)
CO2: 24 mmol/L (ref 22–32)
Calcium: 8.2 mg/dL — ABNORMAL LOW (ref 8.9–10.3)
Chloride: 108 mmol/L (ref 98–111)
Creatinine, Ser: 0.97 mg/dL (ref 0.61–1.24)
GFR, Estimated: >60 mL/min (ref >60)
Glucose, Bld: 134 mg/dL — ABNORMAL HIGH (ref 70–99)
Potassium: 4.1 mmol/L (ref 3.5–5.1)
Sodium: 138 mmol/L (ref 135–145)

## 2022-12-18 LAB — URINALYSIS, ROUTINE W REFLEX MICROSCOPIC
Bilirubin Urine: NEGATIVE
Glucose, UA: NEGATIVE mg/dL
Hgb urine dipstick: NEGATIVE
Ketones, ur: NEGATIVE mg/dL
Leukocytes,Ua: NEGATIVE
Nitrite: NEGATIVE
Protein, ur: NEGATIVE mg/dL
Specific Gravity, Urine: <1.005 — ABNORMAL LOW (ref 1.005–1.030)
pH: 6 (ref 5.0–8.0)

## 2022-12-18 LAB — PROTIME-INR
INR: 1.3 — ABNORMAL HIGH (ref 0.8–1.2)
Prothrombin Time: 16.4 seconds — ABNORMAL HIGH (ref 11.4–15.2)

## 2022-12-18 LAB — GLUCOSE, CAPILLARY
Glucose-Capillary: 112 mg/dL — ABNORMAL HIGH (ref 70–99)
Glucose-Capillary: 121 mg/dL — ABNORMAL HIGH (ref 70–99)
Glucose-Capillary: 126 mg/dL — ABNORMAL HIGH (ref 70–99)
Glucose-Capillary: 130 mg/dL — ABNORMAL HIGH (ref 70–99)
Glucose-Capillary: 132 mg/dL — ABNORMAL HIGH (ref 70–99)
Glucose-Capillary: 140 mg/dL — ABNORMAL HIGH (ref 70–99)
Glucose-Capillary: 163 mg/dL — ABNORMAL HIGH (ref 70–99)
Glucose-Capillary: 175 mg/dL — ABNORMAL HIGH (ref 70–99)

## 2022-12-18 LAB — BLOOD GAS, ARTERIAL
Acid-Base Excess: 2.8 mmol/L — ABNORMAL HIGH (ref 0.0–2.0)
Bicarbonate: 27.2 mmol/L (ref 20.0–28.0)
Drawn by: 67510
O2 Saturation: 97.8 %
Patient temperature: 37.1
pCO2 arterial: 40 mmHg (ref 32–48)
pH, Arterial: 7.44 (ref 7.35–7.45)
pO2, Arterial: 77 mmHg — ABNORMAL LOW (ref 83–108)

## 2022-12-18 LAB — MAGNESIUM: Magnesium: 3 mg/dL — ABNORMAL HIGH (ref 1.7–2.4)

## 2022-12-18 LAB — HEMOGLOBIN AND HEMATOCRIT, BLOOD
HCT: 25.1 % — ABNORMAL LOW (ref 39.0–52.0)
Hemoglobin: 8.6 g/dL — ABNORMAL LOW (ref 13.0–17.0)

## 2022-12-18 LAB — PLATELET COUNT: Platelets: 200 10*3/uL (ref 150–400)

## 2022-12-18 LAB — HEPARIN LEVEL (UNFRACTIONATED): Heparin Unfractionated: 0.37 IU/mL (ref 0.30–0.70)

## 2022-12-18 LAB — PREPARE RBC (CROSSMATCH)

## 2022-12-18 LAB — APTT: aPTT: 38 seconds — ABNORMAL HIGH (ref 24–36)

## 2022-12-18 SURGERY — CORONARY ARTERY BYPASS GRAFTING (CABG)
Anesthesia: General | Site: Chest

## 2022-12-18 MED ORDER — ACETAMINOPHEN 160 MG/5ML PO SOLN: 1000.0000 mg | Freq: Four times a day (QID) | ORAL | Status: DC

## 2022-12-18 MED ORDER — ROCURONIUM BROMIDE 10 MG/ML (PF) SYRINGE
PREFILLED_SYRINGE | INTRAVENOUS | Status: DC | PRN
  Administered 2022-12-18: 100 mg via INTRAVENOUS
  Administered 2022-12-18: 50 mg via INTRAVENOUS
  Administered 2022-12-18: 60 mg via INTRAVENOUS
  Administered 2022-12-18: 40 mg via INTRAVENOUS
  Administered 2022-12-18 (×2): 50 mg via INTRAVENOUS

## 2022-12-18 MED ORDER — ACETAMINOPHEN 650 MG RE SUPP
650.0000 mg | Freq: Once | RECTAL | Status: AC
  Administered 2022-12-18: 650 mg via RECTAL

## 2022-12-18 MED ORDER — ORAL CARE MOUTH RINSE: 15.0000 mL | OROMUCOSAL | Status: DC | PRN

## 2022-12-18 MED ORDER — LACTATED RINGERS IV SOLN: INTRAVENOUS | Status: DC | PRN

## 2022-12-18 MED ORDER — PHENYLEPHRINE 80 MCG/ML (10ML) SYRINGE FOR IV PUSH (FOR BLOOD PRESSURE SUPPORT)
PREFILLED_SYRINGE | INTRAVENOUS | Status: AC
  Filled 2022-12-18: qty 10

## 2022-12-18 MED ORDER — FENTANYL CITRATE (PF) 250 MCG/5ML IJ SOLN
INTRAMUSCULAR | Status: AC
  Filled 2022-12-18: qty 5

## 2022-12-18 MED ORDER — ORAL CARE MOUTH RINSE
15.0000 mL | OROMUCOSAL | Status: DC
  Administered 2022-12-18: 15 mL via OROMUCOSAL

## 2022-12-18 MED ORDER — TRAMADOL HCL 50 MG PO TABS
50.0000 mg | ORAL_TABLET | ORAL | Status: DC | PRN
  Administered 2022-12-19: 100 mg via ORAL
  Administered 2022-12-21: 50 mg via ORAL
  Filled 2022-12-18: qty 1
  Filled 2022-12-18 (×2): qty 2

## 2022-12-18 MED ORDER — SODIUM CHLORIDE 0.9 % IV SOLN: INTRAVENOUS | Status: DC

## 2022-12-18 MED ORDER — ROCURONIUM BROMIDE 10 MG/ML (PF) SYRINGE
PREFILLED_SYRINGE | INTRAVENOUS | Status: AC
  Filled 2022-12-18: qty 10

## 2022-12-18 MED ORDER — PROPOFOL 10 MG/ML IV BOLUS
INTRAVENOUS | Status: AC
  Filled 2022-12-18: qty 20

## 2022-12-18 MED ORDER — ASPIRIN 325 MG PO TBEC: 325.0000 mg | DELAYED_RELEASE_TABLET | Freq: Every day | ORAL | Status: DC

## 2022-12-18 MED ORDER — LIDOCAINE 2% (20 MG/ML) 5 ML SYRINGE
INTRAMUSCULAR | Status: AC
  Filled 2022-12-18: qty 5

## 2022-12-18 MED ORDER — HEPARIN SODIUM (PORCINE) 1000 UNIT/ML IJ SOLN
INTRAMUSCULAR | Status: AC
  Filled 2022-12-18: qty 1

## 2022-12-18 MED ORDER — CHLORHEXIDINE GLUCONATE CLOTH 2 % EX PADS
6.0000 | MEDICATED_PAD | Freq: Every day | CUTANEOUS | Status: DC
  Administered 2022-12-18 – 2022-12-20 (×4): 6 via TOPICAL

## 2022-12-18 MED ORDER — NITROGLYCERIN IN D5W 200-5 MCG/ML-% IV SOLN: 0.0000 ug/min | INTRAVENOUS | Status: DC

## 2022-12-18 MED ORDER — ARTIFICIAL TEARS OPHTHALMIC OINT
TOPICAL_OINTMENT | OPHTHALMIC | Status: DC | PRN
  Administered 2022-12-18: 1 via OPHTHALMIC

## 2022-12-18 MED ORDER — FENTANYL CITRATE (PF) 250 MCG/5ML IJ SOLN
INTRAMUSCULAR | Status: DC | PRN
  Administered 2022-12-18: 50 ug via INTRAVENOUS
  Administered 2022-12-18 (×4): 100 ug via INTRAVENOUS
  Administered 2022-12-18: 250 ug via INTRAVENOUS
  Administered 2022-12-18 (×4): 100 ug via INTRAVENOUS
  Administered 2022-12-18: 150 ug via INTRAVENOUS
  Administered 2022-12-18 (×2): 100 ug via INTRAVENOUS
  Administered 2022-12-18: 50 ug via INTRAVENOUS

## 2022-12-18 MED ORDER — METOPROLOL TARTRATE 25 MG/10 ML ORAL SUSPENSION
12.5000 mg | Freq: Two times a day (BID) | ORAL | Status: DC
  Filled 2022-12-18: qty 10
  Filled 2022-12-18 (×2): qty 5

## 2022-12-18 MED ORDER — SODIUM CHLORIDE (PF) 0.9 % IJ SOLN: OROMUCOSAL | Status: DC | PRN

## 2022-12-18 MED ORDER — PANTOPRAZOLE SODIUM 40 MG PO TBEC
40.0000 mg | DELAYED_RELEASE_TABLET | Freq: Every day | ORAL | Status: DC
  Administered 2022-12-20 – 2022-12-23 (×4): 40 mg via ORAL
  Filled 2022-12-18 (×4): qty 1

## 2022-12-18 MED ORDER — ACETAMINOPHEN 500 MG PO TABS
1000.0000 mg | ORAL_TABLET | Freq: Four times a day (QID) | ORAL | Status: DC
  Administered 2022-12-18 – 2022-12-23 (×15): 1000 mg via ORAL
  Filled 2022-12-18 (×16): qty 2

## 2022-12-18 MED ORDER — BISACODYL 5 MG PO TBEC
10.0000 mg | DELAYED_RELEASE_TABLET | Freq: Every day | ORAL | Status: DC
  Administered 2022-12-19 – 2022-12-22 (×4): 10 mg via ORAL
  Filled 2022-12-18 (×5): qty 2

## 2022-12-18 MED ORDER — SODIUM CHLORIDE 0.9 % IV SOLN: 250.0000 mL | INTRAVENOUS | Status: DC

## 2022-12-18 MED ORDER — DEXTROSE 50 % IV SOLN: 0.0000 mL | INTRAVENOUS | Status: DC | PRN

## 2022-12-18 MED ORDER — PROTAMINE SULFATE 10 MG/ML IV SOLN
INTRAVENOUS | Status: DC | PRN
  Administered 2022-12-18: 280 mg via INTRAVENOUS

## 2022-12-18 MED ORDER — METOPROLOL TARTRATE 12.5 MG HALF TABLET
12.5000 mg | ORAL_TABLET | Freq: Two times a day (BID) | ORAL | Status: DC
  Administered 2022-12-19 – 2022-12-21 (×4): 12.5 mg via ORAL
  Filled 2022-12-18 (×5): qty 1

## 2022-12-18 MED ORDER — BISACODYL 10 MG RE SUPP: 10.0000 mg | Freq: Every day | RECTAL | Status: DC

## 2022-12-18 MED ORDER — LACTATED RINGERS IV SOLN: INTRAVENOUS | Status: DC

## 2022-12-18 MED ORDER — PLASMA-LYTE A IV SOLN: INTRAVENOUS | Status: DC | PRN

## 2022-12-18 MED ORDER — ALBUMIN HUMAN 5 % IV SOLN
250.0000 mL | INTRAVENOUS | Status: AC | PRN
  Administered 2022-12-18 (×4): 12.5 g via INTRAVENOUS
  Filled 2022-12-18 (×2): qty 250

## 2022-12-18 MED ORDER — DOCUSATE SODIUM 100 MG PO CAPS
200.0000 mg | ORAL_CAPSULE | Freq: Every day | ORAL | Status: DC
  Administered 2022-12-19 – 2022-12-22 (×4): 200 mg via ORAL
  Filled 2022-12-18 (×5): qty 2

## 2022-12-18 MED ORDER — PHENYLEPHRINE 80 MCG/ML (10ML) SYRINGE FOR IV PUSH (FOR BLOOD PRESSURE SUPPORT)
PREFILLED_SYRINGE | INTRAVENOUS | Status: DC | PRN
  Administered 2022-12-18 (×4): 40 ug via INTRAVENOUS
  Administered 2022-12-18: 80 ug via INTRAVENOUS
  Administered 2022-12-18: 160 ug via INTRAVENOUS

## 2022-12-18 MED ORDER — VANCOMYCIN HCL IN DEXTROSE 1-5 GM/200ML-% IV SOLN
1000.0000 mg | Freq: Once | INTRAVENOUS | Status: AC
  Administered 2022-12-18: 1000 mg via INTRAVENOUS
  Filled 2022-12-18: qty 200

## 2022-12-18 MED ORDER — MIDAZOLAM HCL (PF) 5 MG/ML IJ SOLN
INTRAMUSCULAR | Status: DC | PRN
  Administered 2022-12-18: 2 mg via INTRAVENOUS
  Administered 2022-12-18: 3 mg via INTRAVENOUS
  Administered 2022-12-18: 1 mg via INTRAVENOUS

## 2022-12-18 MED ORDER — ALBUMIN HUMAN 5 % IV SOLN: INTRAVENOUS | Status: DC | PRN

## 2022-12-18 MED ORDER — PROPOFOL 10 MG/ML IV BOLUS
INTRAVENOUS | Status: DC | PRN
  Administered 2022-12-18: 50 mg via INTRAVENOUS
  Administered 2022-12-18: 20 mg via INTRAVENOUS
  Administered 2022-12-18: 30 mg via INTRAVENOUS
  Administered 2022-12-18: 20 mg via INTRAVENOUS
  Administered 2022-12-18: 30 mg via INTRAVENOUS
  Administered 2022-12-18: 20 mg via INTRAVENOUS
  Administered 2022-12-18: 30 mg via INTRAVENOUS

## 2022-12-18 MED ORDER — MAGNESIUM SULFATE 4 GM/100ML IV SOLN
4.0000 g | Freq: Once | INTRAVENOUS | Status: AC
  Administered 2022-12-18: 4 g via INTRAVENOUS
  Filled 2022-12-18: qty 100

## 2022-12-18 MED ORDER — CHLORHEXIDINE GLUCONATE 0.12 % MT SOLN
15.0000 mL | OROMUCOSAL | Status: AC
  Administered 2022-12-18: 15 mL via OROMUCOSAL
  Filled 2022-12-18: qty 15

## 2022-12-18 MED ORDER — ARFORMOTEROL TARTRATE 15 MCG/2ML IN NEBU
15.0000 ug | INHALATION_SOLUTION | Freq: Two times a day (BID) | RESPIRATORY_TRACT | Status: DC
  Administered 2022-12-18 – 2022-12-19 (×2): 15 ug via RESPIRATORY_TRACT
  Filled 2022-12-18 (×2): qty 2

## 2022-12-18 MED ORDER — ONDANSETRON HCL 4 MG/2ML IJ SOLN: 4.0000 mg | Freq: Four times a day (QID) | INTRAMUSCULAR | Status: DC | PRN

## 2022-12-18 MED ORDER — SODIUM CHLORIDE 0.9% FLUSH: 3.0000 mL | INTRAVENOUS | Status: DC | PRN

## 2022-12-18 MED ORDER — MORPHINE SULFATE (PF) 2 MG/ML IV SOLN
1.0000 mg | INTRAVENOUS | Status: DC | PRN
  Administered 2022-12-18 – 2022-12-20 (×3): 2 mg via INTRAVENOUS
  Filled 2022-12-18 (×3): qty 1

## 2022-12-18 MED ORDER — REVEFENACIN 175 MCG/3ML IN SOLN
175.0000 ug | Freq: Every day | RESPIRATORY_TRACT | Status: DC
  Administered 2022-12-18 – 2022-12-19 (×2): 175 ug via RESPIRATORY_TRACT
  Filled 2022-12-18 (×2): qty 3

## 2022-12-18 MED ORDER — INSULIN REGULAR(HUMAN) IN NACL 100-0.9 UT/100ML-% IV SOLN: INTRAVENOUS | Status: DC

## 2022-12-18 MED ORDER — MIDAZOLAM HCL (PF) 10 MG/2ML IJ SOLN
INTRAMUSCULAR | Status: AC
  Filled 2022-12-18: qty 2

## 2022-12-18 MED ORDER — METOPROLOL TARTRATE 5 MG/5ML IV SOLN: 2.5000 mg | INTRAVENOUS | Status: DC | PRN

## 2022-12-18 MED ORDER — SODIUM CHLORIDE 0.9% FLUSH
3.0000 mL | Freq: Two times a day (BID) | INTRAVENOUS | Status: DC
  Administered 2022-12-19 – 2022-12-20 (×3): 3 mL via INTRAVENOUS

## 2022-12-18 MED ORDER — FAMOTIDINE IN NACL 20-0.9 MG/50ML-% IV SOLN
20.0000 mg | Freq: Two times a day (BID) | INTRAVENOUS | Status: AC
  Administered 2022-12-18: 20 mg via INTRAVENOUS
  Filled 2022-12-18: qty 50

## 2022-12-18 MED ORDER — SODIUM CHLORIDE 0.45 % IV SOLN: INTRAVENOUS | Status: DC | PRN

## 2022-12-18 MED ORDER — SODIUM CHLORIDE (PF) 0.9 % IJ SOLN
INTRAMUSCULAR | Status: AC
  Filled 2022-12-18: qty 10

## 2022-12-18 MED ORDER — HEPARIN SODIUM (PORCINE) 1000 UNIT/ML IJ SOLN
INTRAMUSCULAR | Status: DC | PRN
  Administered 2022-12-18: 10000 [IU] via INTRAVENOUS
  Administered 2022-12-18: 28000 [IU] via INTRAVENOUS

## 2022-12-18 MED ORDER — LACTATED RINGERS IV SOLN: 500.0000 mL | Freq: Once | INTRAVENOUS | Status: DC | PRN

## 2022-12-18 MED ORDER — ACETAMINOPHEN 160 MG/5ML PO SOLN: 650.0000 mg | Freq: Once | ORAL | Status: AC

## 2022-12-18 MED ORDER — EPHEDRINE 5 MG/ML INJ
INTRAVENOUS | Status: AC
  Filled 2022-12-18: qty 5

## 2022-12-18 MED ORDER — CEFAZOLIN SODIUM-DEXTROSE 2-4 GM/100ML-% IV SOLN
2.0000 g | Freq: Three times a day (TID) | INTRAVENOUS | Status: AC
  Administered 2022-12-18 – 2022-12-20 (×6): 2 g via INTRAVENOUS
  Filled 2022-12-18 (×6): qty 100

## 2022-12-18 MED ORDER — 0.9 % SODIUM CHLORIDE (POUR BTL) OPTIME
TOPICAL | Status: DC | PRN
  Administered 2022-12-18: 5000 mL

## 2022-12-18 MED ORDER — PHENYLEPHRINE HCL-NACL 20-0.9 MG/250ML-% IV SOLN: 0.0000 ug/min | INTRAVENOUS | Status: DC

## 2022-12-18 MED ORDER — ASPIRIN 81 MG PO CHEW: 324.0000 mg | CHEWABLE_TABLET | Freq: Every day | ORAL | Status: DC

## 2022-12-18 MED ORDER — OXYCODONE HCL 5 MG PO TABS
5.0000 mg | ORAL_TABLET | ORAL | Status: DC | PRN
  Administered 2022-12-18: 5 mg via ORAL
  Administered 2022-12-19: 10 mg via ORAL
  Administered 2022-12-19 (×3): 5 mg via ORAL
  Filled 2022-12-18 (×2): qty 2
  Filled 2022-12-18 (×3): qty 1

## 2022-12-18 MED ORDER — MIDAZOLAM HCL 2 MG/2ML IJ SOLN: 2.0000 mg | INTRAMUSCULAR | Status: DC | PRN

## 2022-12-18 MED ORDER — POTASSIUM CHLORIDE 10 MEQ/50ML IV SOLN: 10.0000 meq | INTRAVENOUS | Status: AC

## 2022-12-18 MED ORDER — EPHEDRINE SULFATE-NACL 50-0.9 MG/10ML-% IV SOSY
PREFILLED_SYRINGE | INTRAVENOUS | Status: DC | PRN
  Administered 2022-12-18 (×3): 5 mg via INTRAVENOUS

## 2022-12-18 MED ORDER — DEXMEDETOMIDINE HCL IN NACL 400 MCG/100ML IV SOLN
0.0000 ug/kg/h | INTRAVENOUS | Status: DC
  Administered 2022-12-18: 0.5 ug/kg/h via INTRAVENOUS

## 2022-12-18 SURGICAL SUPPLY — 85 items
BAG DECANTER FOR FLEXI CONT (MISCELLANEOUS) ×2 IMPLANT
BLADE CLIPPER SURG (BLADE) ×2 IMPLANT
BLADE STERNUM SYSTEM 6 (BLADE) ×2 IMPLANT
BLADE SURG 11 STRL SS (BLADE) IMPLANT
BNDG ELASTIC 4X5.8 VLCR STR LF (GAUZE/BANDAGES/DRESSINGS) ×2 IMPLANT
BNDG ELASTIC 6X5.8 VLCR STR LF (GAUZE/BANDAGES/DRESSINGS) ×2 IMPLANT
BNDG GAUZE DERMACEA FLUFF 4 (GAUZE/BANDAGES/DRESSINGS) ×2 IMPLANT
CABLE SURGICAL S-101-97-12 (CABLE) ×2 IMPLANT
CANISTER SUCT 3000ML PPV (MISCELLANEOUS) ×2 IMPLANT
CANNULA MC2 2 STG 29/37 NON-V (CANNULA) ×2 IMPLANT
CANNULA MC2 TWO STAGE (CANNULA) ×2
CANNULA NON VENT 20FR 12 (CANNULA) ×2 IMPLANT
CATH ROBINSON RED A/P 18FR (CATHETERS) ×4 IMPLANT
CLIP RETRACTION 3.0MM CORONARY (MISCELLANEOUS) IMPLANT
CLIP TI MEDIUM 24 (CLIP) IMPLANT
CLIP TI WIDE RED SMALL 24 (CLIP) IMPLANT
CONN ST 1/2X1/2  BEN (MISCELLANEOUS)
CONN ST 1/2X1/2 BEN (MISCELLANEOUS) ×2 IMPLANT
CONNECTOR BLAKE 2:1 CARIO BLK (MISCELLANEOUS) ×2 IMPLANT
CONTAINER PROTECT SURGISLUSH (MISCELLANEOUS) ×4 IMPLANT
DERMABOND ADVANCED .7 DNX12 (GAUZE/BANDAGES/DRESSINGS) IMPLANT
DRAIN CHANNEL 19F RND (DRAIN) ×6 IMPLANT
DRAIN CONNECTOR BLAKE 1:1 (MISCELLANEOUS) ×2 IMPLANT
DRAPE CARDIOVASCULAR INCISE (DRAPES) ×2
DRAPE INCISE IOBAN 66X45 STRL (DRAPES) IMPLANT
DRAPE SRG 135X102X78XABS (DRAPES) ×2 IMPLANT
DRAPE WARM FLUID 44X44 (DRAPES) ×2 IMPLANT
DRSG AQUACEL AG ADV 3.5X10 (GAUZE/BANDAGES/DRESSINGS) ×2 IMPLANT
DRSG COVADERM 4X14 (GAUZE/BANDAGES/DRESSINGS) ×2 IMPLANT
ELECT BLADE 4.0 EZ CLEAN MEGAD (MISCELLANEOUS) ×2
ELECT REM PT RETURN 9FT ADLT (ELECTROSURGICAL) ×4
ELECTRODE BLDE 4.0 EZ CLN MEGD (MISCELLANEOUS) ×2 IMPLANT
ELECTRODE REM PT RTRN 9FT ADLT (ELECTROSURGICAL) ×4 IMPLANT
FELT TEFLON 1X6 (MISCELLANEOUS) ×4 IMPLANT
GAUZE 4X4 16PLY ~~LOC~~+RFID DBL (SPONGE) ×2 IMPLANT
GAUZE SPONGE 4X4 12PLY STRL (GAUZE/BANDAGES/DRESSINGS) ×4 IMPLANT
GLOVE BIO SURGEON STRL SZ 6.5 (GLOVE) IMPLANT
GLOVE BIO SURGEON STRL SZ7 (GLOVE) ×4 IMPLANT
GLOVE BIOGEL M STRL SZ7.5 (GLOVE) ×4 IMPLANT
GLOVE BIOGEL PI IND STRL 6.5 (GLOVE) IMPLANT
GLOVE BIOGEL PI IND STRL 7.5 (GLOVE) IMPLANT
GLOVE SS BIOGEL STRL SZ 6.5 (GLOVE) IMPLANT
GLOVE SS BIOGEL STRL SZ 7.5 (GLOVE) IMPLANT
GOWN STRL REUS W/ TWL LRG LVL3 (GOWN DISPOSABLE) ×8 IMPLANT
GOWN STRL REUS W/ TWL XL LVL3 (GOWN DISPOSABLE) ×4 IMPLANT
GOWN STRL REUS W/TWL LRG LVL3 (GOWN DISPOSABLE) ×12
GOWN STRL REUS W/TWL XL LVL3 (GOWN DISPOSABLE) ×4
HEMOSTAT POWDER SURGIFOAM 1G (HEMOSTASIS) ×4 IMPLANT
INSERT SUTURE HOLDER (MISCELLANEOUS) ×2 IMPLANT
KIT BASIN OR (CUSTOM PROCEDURE TRAY) ×2 IMPLANT
KIT SUCTION CATH 14FR (SUCTIONS) ×2 IMPLANT
KIT TURNOVER KIT B (KITS) ×2 IMPLANT
KIT VASOVIEW HEMOPRO 2 VH 4000 (KITS) ×2 IMPLANT
LEAD PACING MYOCARDI (MISCELLANEOUS) ×2 IMPLANT
MARKER GRAFT CORONARY BYPASS (MISCELLANEOUS) ×6 IMPLANT
NS IRRIG 1000ML POUR BTL (IV SOLUTION) ×10 IMPLANT
PACK E OPEN HEART (SUTURE) ×2 IMPLANT
PACK OPEN HEART (CUSTOM PROCEDURE TRAY) ×2 IMPLANT
PAD ARMBOARD 7.5X6 YLW CONV (MISCELLANEOUS) ×4 IMPLANT
PAD ELECT DEFIB RADIOL ZOLL (MISCELLANEOUS) ×2 IMPLANT
PENCIL BUTTON HOLSTER BLD 10FT (ELECTRODE) ×2 IMPLANT
POSITIONER HEAD DONUT 9IN (MISCELLANEOUS) ×2 IMPLANT
PUNCH AORTIC ROTATE 4.0MM (MISCELLANEOUS) ×2 IMPLANT
SET MPS 3-ND DEL (MISCELLANEOUS) IMPLANT
SPONGE T-LAP 18X18 ~~LOC~~+RFID (SPONGE) ×8 IMPLANT
SUPPORT HEART JANKE-BARRON (MISCELLANEOUS) ×2 IMPLANT
SUT BONE WAX W31G (SUTURE) ×2 IMPLANT
SUT ETHIBOND X763 2 0 SH 1 (SUTURE) ×4 IMPLANT
SUT MNCRL AB 3-0 PS2 18 (SUTURE) ×4 IMPLANT
SUT PDS AB 1 CTX 36 (SUTURE) ×4 IMPLANT
SUT PROLENE 4 0 SH DA (SUTURE) ×2 IMPLANT
SUT PROLENE 5 0 C 1 36 (SUTURE) ×6 IMPLANT
SUT PROLENE 7 0 BV 1 (SUTURE) IMPLANT
SUT PROLENE 7 0 BV1 MDA (SUTURE) ×2 IMPLANT
SUT STEEL 6MS V (SUTURE) ×4 IMPLANT
SUT VIC AB 2-0 CT1 36 (SUTURE) IMPLANT
SYSTEM SAHARA CHEST DRAIN ATS (WOUND CARE) ×2 IMPLANT
TAPE CLOTH SURG 4X10 WHT LF (GAUZE/BANDAGES/DRESSINGS) IMPLANT
TAPE PAPER 2X10 WHT MICROPORE (GAUZE/BANDAGES/DRESSINGS) IMPLANT
TOWEL GREEN STERILE (TOWEL DISPOSABLE) ×2 IMPLANT
TOWEL GREEN STERILE FF (TOWEL DISPOSABLE) ×2 IMPLANT
TRAY FOLEY SLVR 16FR TEMP STAT (SET/KITS/TRAYS/PACK) ×2 IMPLANT
TUBING LAP HI FLOW INSUFFLATIO (TUBING) ×2 IMPLANT
UNDERPAD 30X36 HEAVY ABSORB (UNDERPADS AND DIAPERS) ×2 IMPLANT
WATER STERILE IRR 1000ML POUR (IV SOLUTION) ×4 IMPLANT

## 2022-12-18 NOTE — Op Note (Signed)
[address]       [place] [address]             [phone]                                          12/18/2022 Patient:  Nicholas Dominguez Pre-Op Dx: 3V CAD [name] HTN HLP    Post-op Dx:  Same Procedure: CABG X 3.  LIMA LAD, RSVG Diag, OM2   Endoscopic greater saphenous vein harvest on the right   Surgeon and Role:      * Maicie Vanderloop, Lucile Crater, MD - Primary    * B. Stehler , PA-C - assisting An experienced assistant was required given the complexity of this surgery and the standard of surgical care. The assistant was needed for exposure, dissection, suctioning, retraction of delicate tissues and sutures, instrument exchange and for overall help during this procedure.    Anesthesia  general EBL:  548m Blood Administration: none Xclamp Time:  52 min Pump Time:  82 min  Drains: 19 F blake drain: L, mediastinal  Wires: none Counts: correct   Indications: This is a 72year old gentleman that is admitted following a [name].  Left heart cath shows severe three-vessel coronary artery disease.  His echocardiogram shows preserved biventricular function and no significant valvular disease.  We discussed the risk and benefits of surgical revascularization and he is agreeable to proceed.   Findings: Good LIMA, good vein.  Intramyocardial LAD, good diag, small OM  Operative Technique: All invasive lines were placed in pre-op holding.  After the risks, benefits and alternatives were thoroughly discussed, the patient was brought to the operative theatre.  Anesthesia was induced, and the patient was prepped and draped in normal sterile fashion.  An appropriate surgical pause was performed, and pre-operative antibiotics were dosed accordingly.  We began with simultaneous incisions along the right leg for harvesting of the greater saphenous vein and the chest for the sternotomy.  In regards to the sternotomy, this was carried down with bovie cautery, and the sternum was  divided with a reciprocating saw.  Meticulous hemostasis was obtained.  The left internal thoracic artery was exposed and harvested in in pedicled fashion.  The patient was systemically heparinized, and the artery was divided distally, and placed in a papaverine sponge.    The sternal elevator was removed, and a retractor was placed.  The pericardium was divided in the midline and fashioned into a cradle with pericardial stitches.   After we confirmed an appropriate ACT, the ascending aorta was cannulated in standard fashion.  The right atrial appendage was used for venous cannulation site.  Cardiopulmonary bypass was initiated, and the heart retractor was placed. The cross clamp was applied, and a dose of anterograde cardioplegia was given with good arrest of the heart.   Next we exposed the lateral wall, and found a good target on the OM.  An end to side anastomosis with the vein graft was then created.  Next, we exposed the anterior wall of the heart and identified a good target on 1 diagonal.   An arteriotomy was created.  The vein was anastomosed in an end to side fashion.  Finally, we exposed a good target on the LAD, and fashioned an end to side anastomosis between it and the LITA.  We began to re-warm, and a re-animation dose of  cardioplegia was given.  The heart was de-aired, and the cross clamp was removed.  Meticulous hemostasis was obtained.    A partial occludding clamp was then placed on the ascending aorta, and we created an end to side anastomosis between it and the proximal vein grafts.  Rings were placed on the proximal anastomosis.  Hemostasis was obtained, and we separated from cardiopulmonary bypass without event.  The heparin was reversed with protamine.  Chest tubes and wires were placed, and the sternum was re-approximated with sternal wires.  The soft tissue and skin were re-approximated wth absorbable suture.    The patient tolerated the procedure without any immediate complications,  and was transferred to the ICU in guarded condition.  Nicholas Dominguez

## 2022-12-18 NOTE — Hospital Course (Addendum)
History of Present Illness:     Mr. Bastien Coody is a 72 year old gentleman with past history of gastroesophageal reflux disease, small bowel stricture with obstruction, and who is a former tobacco smoker having quit in November 2023.  He presented to the [facility] emergency room yesterday after experiencing left-sided chest pain with radiation to his left jaw and associated shortness of breath while doing some yard work.  Initial EKG showed ST elevations in aVR and aVL with inferior ST depressions.  Initial high-sensitivity troponin was 44 and later rose to greater than 900.  The chest pain subsided.  He was started on aspirin, nitroglycerin, and heparin infusion.  He was transferred to [facility] for further evaluation.  Left heart catheterization was conducted yesterday demonstrating high-grade stenoses in the left anterior descending coronary diagonal, and second OM coronary arteries.  The RCA had a 55% lesion that was evaluated with a flow catheter showing RFR of 0.93. Dr. Ali Lowe asked that we evaluate [name] for consideration of surgical coronary revascularization. Mr. Paola is currently resting in bed.  His wife is at the bedside.  Mr. Wiesner denies any chest pain since initial treatment in the ED blood.  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  He had a laparotomy about 12 weeks ago for management of small bowel obstruction requiring a segmental small bowel resection.  He recovered well from the procedure before the obstruction was diagnosed and treated.  He otherwise considers himself healthy.  He was only taking aspirin and Prilosec prior to admission.  Dr. Kipp Brood reviewed the patient's diagnostic studies and determined surgery would provide the best long term treatment. He reviewed the patient's treatment options as well as the risks and benefits of surgery. Mr. Street was agreeable to proceed with surgery.  Hospital Course: Mr. Brunsvold was an  inpatient at [facility] and was brought to the operating room on 12/18/22. He underwent CABG x 3 utilizing LIMA to LAD, SVG to OM1, and SVG to Diagonal as well as endoscopic harvest of the right greater saphenous vein. He tolerated the procedure well and was transferred to the SICU in stable condition. He was extubated the evening of surgery without complication. His drips were weaned as hemodynamics tolerated. Insulin drip was discontinued and he was started on SSI. He was started on Lopressor which was titrated as able. Plavix was started due to [name]. He was volume overload and diuresed accordingly. His chest tubes were removed without complication. He had expected postoperative blood loss anemia and was started on an iron supplement. He was felt stable for transfer to the progressive unit. He converted to atrial fibrillation with RVR, he was started on Amiodarone gtt and Lopressor was titrated. He chemically converted to sinus rhythm. Amiodarone gtt was transitioned to PO Amiodarone. He was ambulating well. His incisions were healing well without sign of infection.

## 2022-12-18 NOTE — Progress Notes (Signed)
  Echocardiogram Echocardiogram Transesophageal has been performed.  [name] 12/18/2022, 8:41 AM

## 2022-12-18 NOTE — Progress Notes (Signed)
     [address]       [place] [address]             [phone]       No events Vitals:   12/17/22 2300 12/18/22 0442  BP: 116/73 114/71  Pulse:  76  Resp: 16 16  Temp: 98.3 F (36.8 C) 97.7 F (36.5 C)  SpO2: 97% 98%   Alert NAD Sinus  OR today for CABG

## 2022-12-18 NOTE — Anesthesia Procedure Notes (Addendum)
Arterial Line Insertion Start/End2/19/2024 6:55 AM, 12/18/2022 7:05 AM Performed by: Mariea Clonts, CRNA, CRNA  Patient location: Pre-op. Preanesthetic checklist: patient identified, IV checked, site marked, risks and benefits discussed, surgical consent, monitors and equipment checked, pre-op evaluation, timeout performed and anesthesia consent Lidocaine 1% used for infiltration radial was placed Catheter size: 20 G Hand hygiene performed  and maximum sterile barriers used   Attempts: 1 Procedure performed without using ultrasound guided technique. Following insertion, dressing applied. Post procedure assessment: normal and unchanged  Patient tolerated the procedure well with no immediate complications.

## 2022-12-18 NOTE — Consult Note (Signed)
NAME:  Devlon Tatge, MRN:  [id number], DOB:  January 22, 1951, LOS: 4 ADMISSION DATE:  12/14/2022, CONSULTATION DATE:  2/19 REFERRING MD:  Kipp Brood, CHIEF COMPLAINT:  CABG post [name]   History of Present Illness:  Mr. Dinh is a 72 y/o gentleman with a history of HTN, HLD, former tobacco abuse who presented to the hospital on 2/15 with SOB and left jaw pain, found to have a [name]. He underwent LHC emergently demonstrating 3-vessel disease. Due to adequate coronary flow at the time of catherization he did not have stents placed and was referred for surgical revascularization. On 2/18 he developed CP walking with PT that improved with SL NTG and he was started on nitroglycerin infusion until his CABG. In the OR he had revascularization with LIMA to LAD, SVG to OM2, SVG to diag. EBL 500cc, no blood products required in OR.   Pertinent  Medical History  CAD HLD HTN Tobacco abuse  Significant Hospital Events: Including procedures, antibiotic start and stop dates in addition to other pertinent events   2/15 admission, LHC, remained on heparin 2/18 started NTG infusion  Interim History / Subjective:    Objective   Blood pressure 114/71, pulse 76, temperature 97.7 F (36.5 C), temperature source Oral, resp. rate 16, height 5' 11"$  (1.803 m), weight 80.2 kg, SpO2 97 %.    Vent Mode: SIMV;PRVC;PSV FiO2 (%):  [50 %] 50 % Set Rate:  [16 bmp] 16 bmp Vt Set:  [600 mL] 600 mL PEEP:  [5 cmH20] 5 cmH20 Pressure Support:  [10 cmH20] 10 cmH20 Plateau Pressure:  [15 cmH20] 15 cmH20   Intake/Output Summary (Last 24 hours) at 12/18/2022 1353 Last data filed at 12/18/2022 1309 Gross per 24 hour  Intake 2940 ml  Output 2000 ml  Net 940 ml   Filed Weights   12/16/22 0538 12/17/22 0505 12/18/22 0600  Weight: 80.5 kg 80.8 kg 80.2 kg    Examination: General: critically ill appearing man lying in bed in NAD HENT: [place]/AT, eyes anicteric Lungs: breathing comfortably on MV, CTAB Cardiovascular: S1S2,  RRR Abdomen: soft, NT Extremities: no c/c/e, RLE bandaged Neuro: RASS -5, pinpoint pupils GU: foley with clear yellow urine  Echo: LVEF 50-55%, moderate LVH with G1DD. RWMA with akinesis of mid and distal anterior septum. Normal RV. Dilated IVC, normal variability.   PFTs: FEV1/FVC 71% FEV1  3.14 (95% predicted) FVC 4.39 (97% of predicted + bronchodilator reversibility RV 140% predicted, TLC 106% predicted DLCO 75% of predicted  Resolved Hospital Problem list     Assessment & Plan:  CAD with [name], 3-vessel disease s/p CABG x 3 -post-op care per TCTS -complete post-op antibiotics -post-op pain control per protocol- morphine, oxy, tramadol -wean off neosynephrine as able; start Bblocker when off pressors -aspirin, statin -will start plavix post-operatively once bleeding is controlled  Post-op vent management Emphysema -Rapid wean protocol - Anticipate he will require bronchodilators based on PFTs; start Brovana and Yupelri  42m RLL pulmonary nodule seen on 08/2022 CT scan; history of tobacco abuse -Needs outpatient follow-up CT scan scheduled at discharge  Acute blood loss anemia, expected operative blood loss - Defer transfusion decisions to TCTS.  Transfuse for hemoglobin less than 7 or hemodynamically significant bleeding.  Hyperglycemia, expected postop - Insulin infusion per protocol  GERD -con't PPI (PTA med)  Best Practice (right click and "Reselect all SmartList Selections" daily)   Diet/type: NPO DVT prophylaxis: SCD GI prophylaxis: H2B and PPI Lines: Central line and Arterial Line Foley:  Yes, and it is  still needed Code Status:  full code Last date of multidisciplinary goals of care discussion [ ]$   Labs   CBC: Recent Labs  Lab 12/14/22 1410 12/15/22 0144 12/16/22 0050 12/17/22 0656 12/18/22 0034 12/18/22 0807 12/18/22 1113 12/18/22 1143 12/18/22 1154 12/18/22 1237 12/18/22 1245  WBC 9.5 13.5* 11.7* 11.5* 10.6*  --   --   --   --   --    --   HGB 13.0 12.9* 12.0* 11.6* 11.0*   < > 11.2* 8.6* 9.5* 8.2* 7.5*  HCT 39.5 37.3* 34.7* 35.2* 34.1*   < > 33.0* 25.1* 28.0* 24.0* 22.0*  MCV 98.0 96.6 95.9 98.3 99.7  --   --   --   --   --   --   PLT 367 314 291 294 286  --   --  200  --   --   --    < > = values in this interval not displayed.    Basic Metabolic Panel: Recent Labs  Lab 12/14/22 1410 12/14/22 1530 12/15/22 1010 12/17/22 0656 12/18/22 0807 12/18/22 0811 12/18/22 1035 12/18/22 1104 12/18/22 1113 12/18/22 1154 12/18/22 1237 12/18/22 1245  NA 134* 132* 139 137   < > 141 142 139 137 138 142 143  K 3.7 3.5 4.3 4.2   < > 4.0 4.3 4.9 6.6* 5.6* 4.2 4.1  CL 103 102 109 105  --  106 108  --  108 106  --  111  CO2 23 20* 23 25  --   --   --   --   --   --   --   --   GLUCOSE 144* 139* 94 103*  --  96 119*  --  126* 131*  --  141*  BUN 16 14 16 20  $ --  16 15  --  16 16  --  12  CREATININE 0.95 0.97 0.98 1.07  --  0.80 0.70  --  0.70 0.80  --  0.60*  CALCIUM 9.3 8.9 9.1 9.2  --   --   --   --   --   --   --   --    < > = values in this interval not displayed.   GFR: Estimated Creatinine Clearance: 88.9 mL/min (A) (by C-G formula based on SCr of 0.6 mg/dL (L)). Recent Labs  Lab 12/15/22 0144 12/16/22 0050 12/17/22 0656 12/18/22 0034  WBC 13.5* 11.7* 11.5* 10.6*    Liver Function Tests: Recent Labs  Lab 12/14/22 1530  AST 31  ALT 9  ALKPHOS 76  BILITOT 1.1  PROT 6.0*  ALBUMIN 3.5   No results for input(s): "LIPASE", "AMYLASE" in the last 168 hours. No results for input(s): "AMMONIA" in the last 168 hours.  ABG    Component Value Date/Time   PHART 7.403 12/18/2022 1237   PCO2ART 32.9 12/18/2022 1237   PO2ART 309 (H) 12/18/2022 1237   HCO3 20.5 12/18/2022 1237   TCO2 20 (L) 12/18/2022 1245   ACIDBASEDEF 4.0 (H) 12/18/2022 1237   O2SAT 100 12/18/2022 1237     Coagulation Profile: Recent Labs  Lab 12/14/22 1530  INR 1.3*    Cardiac Enzymes: No results for input(s): "CKTOTAL", "CKMB",  "CKMBINDEX", "TROPONINI" in the last 168 hours.  HbA1C: Hgb A1c MFr Bld  Date/Time Value Ref Range Status  12/14/2022 03:30 PM 5.3 4.8 - 5.6 % Final    Comment:    (NOTE) Pre diabetes:  5.7%-6.4%  Diabetes:              >6.4%  Glycemic control for   <7.0% adults with diabetes   12/14/2022 02:10 PM 5.1 4.8 - 5.6 % Final    Comment:    (NOTE) Pre diabetes:          5.7%-6.4%  Diabetes:              >6.4%  Glycemic control for   <7.0% adults with diabetes     CBG: No results for input(s): "GLUCAP" in the last 168 hours.  Review of Systems:   Unable to be obtained due to mental status.  Past Medical History:  He,  has a past medical history of GERD (gastroesophageal reflux disease) and Tobacco use.   Surgical History:   Past Surgical History:  Procedure Laterality Date   BIOPSY  04/06/2022   Procedure: BIOPSY;  Surgeon: Eloise Harman, DO;  [facility];  Service: Endoscopy;;   BOWEL RESECTION  09/20/2022   Procedure: SMALL BOWEL RESECTION;  Surgeon: [name], MD;  Location: [facility];  Service: General;;   COLONOSCOPY WITH PROPOFOL N/A 04/06/2022   Procedure: COLONOSCOPY WITH PROPOFOL;  Surgeon: Eloise Harman, DO;  Location: [facility];  Service: Endoscopy;  Laterality: N/A;  11:00am   CORONARY ANGIOGRAPHY N/A 12/14/2022   Procedure: CORONARY ANGIOGRAPHY;  Surgeon: Early Osmond, MD;  Location: [facility];  Service: Cardiovascular;  Laterality: N/A;   ESOPHAGOGASTRODUODENOSCOPY (EGD) WITH PROPOFOL N/A 04/06/2022   Procedure: ESOPHAGOGASTRODUODENOSCOPY (EGD) WITH PROPOFOL;  Surgeon: Eloise Harman, DO;  [facility];  Service: Endoscopy;  Laterality: N/A;   GIVENS CAPSULE STUDY N/A 09/04/2022   Procedure: GIVENS CAPSULE STUDY;  Surgeon: Eloise Harman, DO;  [facility]: [facility];  Service: Endoscopy;  Laterality: N/A;  7:30am   INTRAVASCULAR PRESSURE WIRE/FFR STUDY N/A 12/14/2022   Procedure: INTRAVASCULAR  PRESSURE WIRE/FFR STUDY;  Surgeon: Early Osmond, MD;  Location: [facility];  Service: Cardiovascular;  Laterality: N/A;   LAPAROTOMY N/A 09/20/2022   Procedure: EXPLORATORY LAPAROTOMY, removal of capsule;  Surgeon: [name], MD;  Location: [facility];  Service: General;  Laterality: N/A;   POLYPECTOMY  04/06/2022   Procedure: POLYPECTOMY;  Surgeon: Eloise Harman, DO;  [facility];  Service: Endoscopy;;   TONSILLECTOMY     VASECTOMY       Social History:   reports that he has quit smoking. His smoking use included cigarettes. He smoked an average of .25 packs per day. He has been exposed to tobacco smoke. He has never used smokeless tobacco. He reports that he does not currently use alcohol. He reports that he does not currently use drugs.   Family History:  His family history includes Heart disease in his mother; Prostate cancer in his father; Stroke in his father.   Allergies No Known Allergies   Home Medications  Prior to Admission medications   Medication Sig Start Date End Date Taking? Authorizing Provider  aspirin 81 MG EC tablet Take 81 mg by mouth daily.   Yes [provider]  Multiple Vitamin (MULTIVITAMIN) tablet Take 1 tablet by mouth daily.   Yes [provider]  NIACIN PO Take by mouth. Otc one daily   Yes [provider]  omeprazole (PRILOSEC) 40 MG capsule Take 1 capsule (40 mg total) by mouth daily. 04/06/22 04/06/23 Yes Eloise Harman, DO     Critical  care time: 45 min.     Julian Hy, DO 12/18/22 2:16 PM [facility]  For contact information, see Amion. If no response to pager, please call PCCM consult pager. After hours, 7PM- 7AM, please call Elink.

## 2022-12-18 NOTE — Brief Op Note (Signed)
12/14/2022 - 12/18/2022  1:19 PM  PATIENT:  Nicholas Dominguez  72 y.o. male  PRE-OPERATIVE DIAGNOSIS:  CAD  POST-OPERATIVE DIAGNOSIS:  CAD  PROCEDURE:  CORONARY ARTERY BYPASS GRAFTING (CABG) X THREE USING LEFT INTERNAL MAMMARY ARTERY AND RIGHT GREATER SAPHENOUS LEG VEIN HARVESTED ENDOSCOPICALLY. -LIMA to LAD -SVG to OM2 -SVG to Diagonal Vein harvest time: 111mn Vein prep time: 282m  TRANSESOPHAGEAL ECHOCARDIOGRAM (N/A)  SURGEON:  Surgeon(s) and Role:  Lightfoot, HaLucile CraterMD - Primary  PHYSICIAN ASSISTANT: BaWynelle BeckmannA-C, WaJadene PieriniA-C  ASSISTANTS: KrFarrel GordonNFA   ANESTHESIA:   general  EBL:  500 mL   BLOOD ADMINISTERED:none  DRAINS:  Mediastinal and pleural chest tube    LOCAL MEDICATIONS USED:  NONE  SPECIMEN:  No Specimen  DISPOSITION OF SPECIMEN:  N/A  COUNTS CORRECT:  YES  DICTATION: .Dragon Dictation  PLAN OF CARE: Admit to inpatient   PATIENT DISPOSITION:  ICU - intubated and hemodynamically stable.   Delay start of Pharmacological VTE agent (>24hrs) due to surgical blood loss or risk of bleeding: yes

## 2022-12-18 NOTE — Anesthesia Procedure Notes (Signed)
Procedure Name: Intubation Date/Time: 12/18/2022 8:01 AM  Performed by: Mariea Clonts, CRNAPre-anesthesia Checklist: Patient identified, Emergency Drugs available, Suction available and Patient being monitored Patient Re-evaluated:Patient Re-evaluated prior to induction Oxygen Delivery Method: Circle System Utilized Preoxygenation: Pre-oxygenation with 100% oxygen Induction Type: IV induction Ventilation: Mask ventilation without difficulty Laryngoscope Size: Mac and 4 Grade View: Grade I Tube type: Oral Tube size: 8.0 mm Number of attempts: 1 Airway Equipment and Method: Stylet and Oral airway Placement Confirmation: ETT inserted through vocal cords under direct vision, positive ETCO2 and breath sounds checked- equal and bilateral Secured at: 23 cm Tube secured with: Tape Dental Injury: Teeth and Oropharynx as per pre-operative assessment

## 2022-12-18 NOTE — Anesthesia Procedure Notes (Addendum)
Central Venous Catheter Insertion Performed by: Suzette Battiest, MD, anesthesiologist Start/End2/19/2024 6:55 AM, 12/18/2022 7:10 AM Patient location: Pre-op. Preanesthetic checklist: patient identified, IV checked, site marked, risks and benefits discussed, surgical consent, monitors and equipment checked, pre-op evaluation, timeout performed and anesthesia consent Position: Trendelenburg Lidocaine 1% used for infiltration and patient sedated Hand hygiene performed , maximum sterile barriers used  and Seldinger technique used Catheter size: 8.5 Fr Total catheter length 10. Central line was placed.Sheath introducer Swan type:thermodilution Procedure performed using ultrasound guided technique. Ultrasound Notes:anatomy identified, needle tip was noted to be adjacent to the nerve/plexus identified, no ultrasound evidence of intravascular and/or intraneural injection and image(s) printed for medical record Attempts: 1 Following insertion, line sutured, dressing applied and Biopatch. Post procedure assessment: blood return through all ports, free fluid flow and no air  Patient tolerated the procedure well with no immediate complications. Additional procedure comments: Triple lumen slic catheter inserted through introducer port. All lines aspirated and flushed.Marland Kitchen

## 2022-12-18 NOTE — Progress Notes (Signed)
Patient attempted first weaning trial. Respiratory rate decreased to 4. Ventilator continuously alarming due to patient's apnea. Wean failed. Respiratory therapy increased respiratory rate to 16. Patient currently responsive to voice and following commands.

## 2022-12-18 NOTE — Transfer of Care (Signed)
Immediate Anesthesia Transfer of Care Note  Patient: Nicholas Dominguez  Procedure(s) Performed: CORONARY ARTERY BYPASS GRAFTING (CABG) X THREE USING LEFT INTERNAL MAMMARY ARTERY AND RIGHT GREATER SAPHENOUS LEG VEIN HARVESTED ENDOSCOPICALLY. (Chest) TRANSESOPHAGEAL ECHOCARDIOGRAM  Patient Location: ICU  Anesthesia Type:General  Level of Consciousness: sedated and Patient remains intubated per anesthesia plan  Airway & Oxygen Therapy: Patient remains intubated per anesthesia plan  Post-op Assessment: Report given to RN and Post -op Vital signs reviewed and stable  Post vital signs: Reviewed and stable  Last Vitals:  Vitals Value Taken Time  BP 129/77 12/18/22 1329  Temp    Pulse 83 12/18/22 1333  Resp 15 12/18/22 1333  SpO2 99 % 12/18/22 1333  Vitals shown include unvalidated device data.  Last Pain:  Vitals:   12/18/22 0442  TempSrc: Oral  PainSc:       Patients Stated Pain Goal: 0 ([id number])  Complications: No notable events documented.

## 2022-12-18 NOTE — Anesthesia Preprocedure Evaluation (Signed)
Anesthesia Evaluation  Patient identified by MRN, date of birth, ID band Patient awake    Reviewed: Allergy & Precautions, NPO status , Patient's Chart, lab work & pertinent test results  Airway Mallampati: III  TM Distance: >3 FB Neck ROM: Full    Dental  (+) Dental Advisory Given   Pulmonary Patient abstained from smoking., former smoker   breath sounds clear to auscultation       Cardiovascular + CAD and + Past MI   Rhythm:Regular Rate:Normal     Neuro/Psych negative neurological ROS     GI/Hepatic Neg liver ROS,GERD  ,,  Endo/Other  negative endocrine ROS    Renal/GU negative Renal ROS     Musculoskeletal   Abdominal   Peds  Hematology negative hematology ROS (+)   Anesthesia Other Findings   Reproductive/Obstetrics                             Anesthesia Physical Anesthesia Plan  ASA: 4  Anesthesia Plan: General   Post-op Pain Management:    Induction: Intravenous  PONV Risk Score and Plan: 2 and Dexamethasone, Ondansetron and Treatment may vary due to age or medical condition  Airway Management Planned: Oral ETT  Additional Equipment: Arterial line, CVP, TEE and Ultrasound Guidance Line Placement  Intra-op Plan:   Post-operative Plan: Post-operative intubation/ventilation  Informed Consent: I have reviewed the patients History and Physical, chart, labs and discussed the procedure including the risks, benefits and alternatives for the proposed anesthesia with the patient or authorized representative who has indicated his/her understanding and acceptance.     Dental advisory given  Plan Discussed with: CRNA  Anesthesia Plan Comments:        Anesthesia Quick Evaluation

## 2022-12-18 NOTE — Procedures (Signed)
Extubation Procedure Note  Patient Details:   Name: Nicholas Dominguez DOB: 01-02-1951 MRN: [id number]   Airway Documentation:    Vent end date: 12/18/22 Vent end time: 2100   Evaluation  O2 sats: stable throughout Complications: No apparent complications Patient did tolerate procedure well. Bilateral Breath Sounds: Clear, Diminished   Yes  [name] 12/18/2022, 9:04 PM  Pt extubated per rapid wean protocol. Pt able to perform NIF of -22 VC of 755m. Pt had positive cuff leak and was placed on 4L [place] at this time.

## 2022-12-19 ENCOUNTER — Inpatient Hospital Stay (HOSPITAL_COMMUNITY): Payer: Medicare Other

## 2022-12-19 ENCOUNTER — Encounter (HOSPITAL_COMMUNITY): Payer: Self-pay | Admitting: Thoracic Surgery (Cardiothoracic Vascular Surgery)

## 2022-12-19 DIAGNOSIS — Z951 Presence of aortocoronary bypass graft: Secondary | ICD-10-CM | POA: Diagnosis not present

## 2022-12-19 DIAGNOSIS — J432 Centrilobular emphysema: Secondary | ICD-10-CM | POA: Diagnosis not present

## 2022-12-19 DIAGNOSIS — I2102 ST elevation (STEMI) myocardial infarction involving left anterior descending coronary artery: Secondary | ICD-10-CM | POA: Diagnosis not present

## 2022-12-19 LAB — GLUCOSE, CAPILLARY
Glucose-Capillary: 149 mg/dL — ABNORMAL HIGH (ref 70–99)
Glucose-Capillary: 139 mg/dL — ABNORMAL HIGH (ref 70–99)
Glucose-Capillary: 141 mg/dL — ABNORMAL HIGH (ref 70–99)
Glucose-Capillary: 130 mg/dL — ABNORMAL HIGH (ref 70–99)
Glucose-Capillary: 143 mg/dL — ABNORMAL HIGH (ref 70–99)
Glucose-Capillary: 147 mg/dL — ABNORMAL HIGH (ref 70–99)
Glucose-Capillary: 114 mg/dL — ABNORMAL HIGH (ref 70–99)
Glucose-Capillary: 119 mg/dL — ABNORMAL HIGH (ref 70–99)
Glucose-Capillary: 136 mg/dL — ABNORMAL HIGH (ref 70–99)
Glucose-Capillary: 113 mg/dL — ABNORMAL HIGH (ref 70–99)
Glucose-Capillary: 116 mg/dL — ABNORMAL HIGH (ref 70–99)

## 2022-12-19 LAB — BASIC METABOLIC PANEL
Anion gap: 11 (ref 5–15)
Anion gap: 6 (ref 5–15)
BUN: 19 mg/dL (ref 8–23)
BUN: 22 mg/dL (ref 8–23)
CO2: 21 mmol/L — ABNORMAL LOW (ref 22–32)
CO2: 25 mmol/L (ref 22–32)
Calcium: 8.4 mg/dL — ABNORMAL LOW (ref 8.9–10.3)
Calcium: 8.6 mg/dL — ABNORMAL LOW (ref 8.9–10.3)
Chloride: 106 mmol/L (ref 98–111)
Chloride: 104 mmol/L (ref 98–111)
Creatinine, Ser: 1 mg/dL (ref 0.61–1.24)
Creatinine, Ser: 1.08 mg/dL (ref 0.61–1.24)
GFR, Estimated: >60 mL/min (ref >60)
GFR, Estimated: >60 mL/min (ref >60)
Glucose, Bld: 137 mg/dL — ABNORMAL HIGH (ref 70–99)
Glucose, Bld: 145 mg/dL — ABNORMAL HIGH (ref 70–99)
Potassium: 4.4 mmol/L (ref 3.5–5.1)
Potassium: 4.3 mmol/L (ref 3.5–5.1)
Sodium: 138 mmol/L (ref 135–145)
Sodium: 135 mmol/L (ref 135–145)

## 2022-12-19 LAB — MAGNESIUM
Magnesium: 2.6 mg/dL — ABNORMAL HIGH (ref 1.7–2.4)
Magnesium: 2.5 mg/dL — ABNORMAL HIGH (ref 1.7–2.4)

## 2022-12-19 LAB — CBC
HCT: 25.1 % — ABNORMAL LOW (ref 39.0–52.0)
HCT: 25.8 % — ABNORMAL LOW (ref 39.0–52.0)
Hemoglobin: 8.5 g/dL — ABNORMAL LOW (ref 13.0–17.0)
Hemoglobin: 8.6 g/dL — ABNORMAL LOW (ref 13.0–17.0)
MCH: 33.3 pg (ref 26.0–34.0)
MCH: 33.3 pg (ref 26.0–34.0)
MCHC: 33.9 g/dL (ref 30.0–36.0)
MCHC: 33.3 g/dL (ref 30.0–36.0)
MCV: 98.4 fL (ref 80.0–100.0)
MCV: 100 fL (ref 80.0–100.0)
Platelets: 181 10*3/uL (ref 150–400)
Platelets: 204 10*3/uL (ref 150–400)
RBC: 2.55 MIL/uL — ABNORMAL LOW (ref 4.22–5.81)
RBC: 2.58 MIL/uL — ABNORMAL LOW (ref 4.22–5.81)
RDW: 13.7 % (ref 11.5–15.5)
RDW: 14 % (ref 11.5–15.5)
WBC: 16.8 10*3/uL — ABNORMAL HIGH (ref 4.0–10.5)
WBC: 17.5 10*3/uL — ABNORMAL HIGH (ref 4.0–10.5)
nRBC: 0.1 % (ref 0.0–0.2)
nRBC: 0 % (ref 0.0–0.2)

## 2022-12-19 MED ORDER — ENOXAPARIN SODIUM 40 MG/0.4ML IJ SOSY
40.0000 mg | PREFILLED_SYRINGE | Freq: Every day | INTRAMUSCULAR | Status: DC
  Administered 2022-12-19 – 2022-12-22 (×4): 40 mg via SUBCUTANEOUS
  Filled 2022-12-19 (×4): qty 0.4

## 2022-12-19 MED ORDER — SODIUM CHLORIDE 0.9% FLUSH: 10.0000 mL | INTRAVENOUS | Status: DC | PRN

## 2022-12-19 MED ORDER — INSULIN ASPART 100 UNIT/ML IJ SOLN
0.0000 [IU] | INTRAMUSCULAR | Status: DC
  Administered 2022-12-19 – 2022-12-20 (×2): 2 [IU] via SUBCUTANEOUS

## 2022-12-19 MED ORDER — CLOPIDOGREL BISULFATE 75 MG PO TABS
75.0000 mg | ORAL_TABLET | Freq: Every day | ORAL | Status: DC
  Administered 2022-12-19 – 2022-12-23 (×5): 75 mg via ORAL
  Filled 2022-12-19 (×5): qty 1

## 2022-12-19 MED ORDER — ASPIRIN 81 MG PO TBEC
81.0000 mg | DELAYED_RELEASE_TABLET | Freq: Every day | ORAL | Status: DC
  Administered 2022-12-19 – 2022-12-23 (×5): 81 mg via ORAL
  Filled 2022-12-19 (×5): qty 1

## 2022-12-19 MED ORDER — SODIUM CHLORIDE 0.9% FLUSH
10.0000 mL | Freq: Two times a day (BID) | INTRAVENOUS | Status: DC
  Administered 2022-12-19: 30 mL
  Administered 2022-12-20: 10 mL

## 2022-12-19 NOTE — Anesthesia Postprocedure Evaluation (Signed)
Anesthesia Post Note  Patient: Nicholas Dominguez  Procedure(s) Performed: CORONARY ARTERY BYPASS GRAFTING (CABG) X THREE USING LEFT INTERNAL MAMMARY ARTERY AND RIGHT GREATER SAPHENOUS LEG VEIN HARVESTED ENDOSCOPICALLY. (Chest) TRANSESOPHAGEAL ECHOCARDIOGRAM     Patient location during evaluation: SICU Anesthesia Type: General Level of consciousness: sedated Pain management: pain level controlled Vital Signs Assessment: post-procedure vital signs reviewed and stable Respiratory status: patient remains intubated per anesthesia plan Cardiovascular status: stable Postop Assessment: no apparent nausea or vomiting Anesthetic complications: no   No notable events documented.  Last Vitals:  Vitals:   12/19/22 0912 12/19/22 1000  BP:  131/75  Pulse: 99 (!) 106  Resp: 19 (!) 35  Temp:    SpO2: 96% 98%    Last Pain:  Vitals:   12/19/22 0800  TempSrc:   PainSc: 5                  Tiajuana Amass

## 2022-12-19 NOTE — Progress Notes (Signed)
      [address]       [place] [address]             [phone]      POD # 1 CABG x 3  BP 109/68   Pulse 94   Temp 98.7 F (37.1 C) (Oral)   Resp (!) 26   Ht 5' 11"$  (1.803 m)   Wt 84.1 kg   SpO2 94%   BMI 25.87 kg/m    Intake/Output Summary (Last 24 hours) at 12/19/2022 1807 Last data filed at 12/19/2022 1600 Gross per 24 hour  Intake 1509.06 ml  Output 1099 ml  Net 410.06 ml   Hct 26 BMET pending  Doing well POD # 1  Nicholas Hunsucker C. Roxan Hockey, MD [facility] [phone]

## 2022-12-19 NOTE — Progress Notes (Signed)
      [address]       [place] [address]             [phone]                 1 Day Post-Op Procedure(s) (LRB): CORONARY ARTERY BYPASS GRAFTING (CABG) X THREE USING LEFT INTERNAL MAMMARY ARTERY AND RIGHT GREATER SAPHENOUS LEG VEIN HARVESTED ENDOSCOPICALLY. (N/A) TRANSESOPHAGEAL ECHOCARDIOGRAM (N/A)   Events: No events Doing well _______________________________________________________________ Vitals: BP 124/70   Pulse (!) 111   Temp 99.5 F (37.5 C)   Resp (!) 26   Ht 5' 11"$  (1.803 m)   Wt 84.1 kg   SpO2 92%   BMI 25.87 kg/m  Filed Weights   12/17/22 0505 12/18/22 0600 12/19/22 0500  Weight: 80.8 kg 80.2 kg 84.1 kg     - Neuro: alert NAD  - Cardiovascular: sinus  Drips: none.   CVP:  [0 mmHg-7 mmHg] 3 mmHg  - Pulm: EWOB   ABG    Component Value Date/Time   PHART 7.313 (L) 12/18/2022 2214   PCO2ART 43.8 12/18/2022 2214   PO2ART 91 12/18/2022 2214   HCO3 22.1 12/18/2022 2214   TCO2 23 12/18/2022 2214   ACIDBASEDEF 4.0 (H) 12/18/2022 2214   O2SAT 96 12/18/2022 2214    - Abd: ND - Extremity: warm  .Intake/Output      02/19 0701 02/20 0700 02/20 0701 02/21 0700   P.O. 125    I.V. (mL/kg) 3121.3 (37.1)    Blood 250    IV Piggyback 2355.3    Total Intake(mL/kg) 5851.5 (69.6)    Urine (mL/kg/hr) 2760 (1.4)    Emesis/NG output     Other     Stool     Blood 500    Chest Tube 342    Total Output 3602    Net +2249.5            _______________________________________________________________ Labs:    Latest Ref Rng & Units 12/19/2022    3:55 AM 12/18/2022   10:14 PM 12/18/2022    8:51 PM  CBC  WBC 4.0 - 10.5 K/uL 16.8     Hemoglobin 13.0 - 17.0 g/dL 8.5  9.5  9.2   Hematocrit 39.0 - 52.0 % 25.1  28.0  27.0   Platelets 150 - 400 K/uL 181         Latest Ref Rng & Units 12/19/2022    3:55 AM 12/18/2022   10:14 PM 12/18/2022    8:51 PM  CMP  Glucose 70 - 99 mg/dL 137     BUN 8 - 23 mg/dL 19     Creatinine 0.61 - 1.24  mg/dL 1.00     Sodium 135 - 145 mmol/L 138  139  141   Potassium 3.5 - 5.1 mmol/L 4.4  4.3  4.3   Chloride 98 - 111 mmol/L 106     CO2 22 - 32 mmol/L 21     Calcium 8.9 - 10.3 mg/dL 8.4       CXR: Gastric bubble, PV congestion  _______________________________________________________________  Assessment and Plan: POD 1 s/p CABG  Neuro: pain controlled.  CV: will remove A line.  Titrating BB.  Will start plavix today Pulm: IS, ambulation Renal: creat stable, gentle diuresis GI: on diet Heme: stable ID: afebrile Endo: SSI Dispo: possible floor today   Lajuana Matte 12/19/2022 7:54 AM

## 2022-12-19 NOTE — TOC Initial Note (Signed)
Transition of Care [facility]) - Initial/Assessment Note    Patient Details  Name: Nicholas Dominguez MRN: [id number] Date of Birth: September 02, 1951  Transition of [facility]) CM/SW Contact:    Bethena Roys, RN Phone Number: 12/19/2022, 11:31 AM  Clinical Narrative:  Patient presented for chest pain/[name]- POD 1 CABG. PTA patient was independent from home with spouse support. Case Manager will continue to follow for transition of care needs as the patient progresses.                 Expected Discharge Plan: [place] Barriers to Discharge: Continued Medical Work up   Patient Goals and CMS Choice Patient states their goals for this hospitalization and ongoing recovery are:: to return home  Expected Discharge Plan and Services In-house Referral: NA Discharge Planning Services: CM Consult   Living arrangements for the past 2 months: Single Family Home                   DME Agency: NA    Prior Living Arrangements/Services Living arrangements for the past 2 months: Single Family Home Lives with:: Spouse Patient language and need for interpreter reviewed:: Yes        Need for Family Participation in Patient Care: Yes (Comment) Care giver support system in place?: Yes (comment)   Criminal Activity/Legal Involvement Pertinent to Current Situation/Hospitalization: No - Comment as needed  Permission Sought/Granted Permission sought to share information with : Case Manager, Family Supports   Emotional Assessment Appearance:: Appears stated age Attitude/Demeanor/Rapport: Engaged Affect (typically observed): Appropriate Orientation: : Oriented to Situation, Oriented to  Time, Oriented to Place, Oriented to Self Alcohol / Substance Use: Not Applicable Psych Involvement: No (comment)  Admission diagnosis:  ST elevation myocardial infarction ([name]), unspecified artery (HCC) [I21.3] NSTEMI (non-ST elevated myocardial infarction) [facility]) [I21.4] Patient Active Problem  List   Diagnosis Date Noted   Centrilobular emphysema ([place]) 12/19/2022   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   ST elevation myocardial infarction ([name]) ([place]) 12/14/2022   Protein-calorie malnutrition, severe 09/23/2022   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Phone: [phone]: [phone] Social Determinants of Health (SDOH) Social History: [place] No Food Insecurity (09/19/2022)  Housing: Low Risk  (09/19/2022)  Transportation Needs: No Transportation Needs (09/19/2022)  Utilities: Not At Risk (09/19/2022)  Tobacco Use: Medium Risk (12/19/2022)   Readmission Risk Interventions     No data to display

## 2022-12-19 NOTE — Progress Notes (Signed)
NAME:  Nicholas Dominguez, MRN:  [id number], DOB:  Apr 30, 1951, LOS: 5 ADMISSION DATE:  12/14/2022, CONSULTATION DATE:  2/19 REFERRING MD:  Kipp Brood, CHIEF COMPLAINT:  CABG post [name]   History of Present Illness:  [name] is a 72 y/o gentleman with a history of HTN, HLD, former tobacco abuse who presented to the hospital on 2/15 with SOB and left jaw pain, found to have a [name]. He underwent LHC emergently demonstrating 3-vessel disease. Due to adequate coronary flow at the time of catherization he did not have stents placed and was referred for surgical revascularization. On 2/18 he developed CP walking with PT that improved with SL NTG and he was started on nitroglycerin infusion until his CABG. In the OR he had revascularization with LIMA to LAD, SVG to OM2, SVG to diag. EBL 500cc, no blood products required in OR.   Pertinent  Medical History  CAD HLD HTN Tobacco abuse  Significant Hospital Events: Including procedures, antibiotic start and stop dates in addition to other pertinent events   2/15 admission, LHC, remained on heparin 2/18 started NTG infusion  Interim History / Subjective:  Off neosynephrine this morning. Feels well, no nausea. Mild inspiratory pain in left upper chest.  Objective   Blood pressure 104/60, pulse (!) 103, temperature 99.1 F (37.3 C), resp. rate (!) 25, height 5' 11"$  (1.803 m), weight 80.2 kg, SpO2 97 %. CVP:  [0 mmHg-7 mmHg] 2 mmHg  Vent Mode: PSV;CPAP FiO2 (%):  [40 %-50 %] 40 % Set Rate:  [4 bmp-18 bmp] 4 bmp Vt Set:  [600 mL] 600 mL PEEP:  [5 cmH20] 5 cmH20 Pressure Support:  [10 cmH20] 10 cmH20 Plateau Pressure:  [15 cmH20-16 cmH20] 16 cmH20   Intake/Output Summary (Last 24 hours) at 12/19/2022 0641 Last data filed at 12/19/2022 0600 Gross per 24 hour  Intake 5817.9 ml  Output 3572 ml  Net 2245.9 ml    Filed Weights   12/16/22 0538 12/17/22 0505 12/18/22 0600  Weight: 80.5 kg 80.8 kg 80.2 kg    Examination: General: elderly man sitting  up in the chair in NAD eating breakfast HENT: [place]/AT, eyes anicteric Lungs: breathing comfortably on RA, no conversational dyspnea Cardiovascular: S1S2, RRR. Minimal bloody output from chest tube. Abdomen: soft, NT Extremities: no c/c, minimal edema Neuro: awake, alert, moving all extremities GU: foley with clear yellow urine  PFTs: FEV1/FVC 71% FEV1  3.14 (95% predicted) FVC 4.39 (97% of predicted + bronchodilator reversibility RV 140% predicted, TLC 106% predicted DLCO 75% of predicted  BUN 19 Cr 1.0 WBC 16.8 H/H 8.5/25.1 Platelets 181  CXR personally reviewed>Large gastric air bubble elevating left hemidiaphgram. Increased interstitial markings bilaterally, RIJ CVC. EKG: NSR, normal axis, TWI in AVL only.  Resolved Hospital Problem list     Assessment & Plan:  CAD with [name], 3-vessel disease s/p CABG x 3 -post op care per TCTS -pain control per protocol -progress mobility -complete post-op antibiotics -Statin, aspirin, start Plavix today. - Metoprolol -Remove chest tubes  Emphysema - Continue Brovana and Yupelri today. Will d/c as long as he continues to do well, but can resume if he has wheezing or worsening respiratory symptoms.  58m RLL pulmonary nodule seen on 08/2022 CT scan; history of tobacco abuse (quit Nov 2023) -Needs outpatient follow-up CT scan scheduled at discharge  Acute blood loss anemia, expected operative blood loss -Defer transfusions to T CTS.  Transfuse for hemoglobin less than 7 or hemodynamically significant bleeding.  Hyperglycemia, expected postop. A1c 5.3 -  transition to basal bolus insulin, progress diet  GERD -Continue PPI, which is a PTA medication  Best Practice (right click and "Reselect all SmartList Selections" daily)   Diet/type: Regular consistency (see orders) DVT prophylaxis: LMWH GI prophylaxis: PPI Lines: Central line Foley:  Yes, and it is still needed Code Status:  full code Last date of multidisciplinary goals  of care discussion [ ]$   Labs   CBC: Recent Labs  Lab 12/17/22 0656 12/18/22 0034 12/18/22 0807 12/18/22 1143 12/18/22 1154 12/18/22 1342 12/18/22 1941 12/18/22 2051 12/18/22 2214 12/19/22 0355  WBC 11.5* 10.6*  --   --   --  15.4* 18.4*  --   --  16.8*  HGB 11.6* 11.0*   < > 8.6*   < > 9.3* 9.4* 9.2* 9.5* 8.5*  HCT 35.2* 34.1*   < > 25.1*   < > 28.7* 27.9* 27.0* 28.0* 25.1*  MCV 98.3 99.7  --   --   --  99.7 97.9  --   --  98.4  PLT 294 286  --  200  --  189 214  --   --  181   < > = values in this interval not displayed.     Basic Metabolic Panel: Recent Labs  Lab 12/14/22 1530 12/15/22 1010 12/17/22 0656 12/18/22 0807 12/18/22 1113 12/18/22 1154 12/18/22 1237 12/18/22 1245 12/18/22 1340 12/18/22 1941 12/18/22 2051 12/18/22 2214 12/19/22 0355  NA 132* 139 137   < > 137 138   < > 143 140 138 141 139 138  K 3.5 4.3 4.2   < > 6.6* 5.6*   < > 4.1 4.4 4.1 4.3 4.3 4.4  CL 102 109 105   < > 108 106  --  111  --  108  --   --  106  CO2 20* 23 25  --   --   --   --   --   --  24  --   --  21*  GLUCOSE 139* 94 103*   < > 126* 131*  --  141*  --  134*  --   --  137*  BUN 14 16 20   $ < > 16 16  --  12  --  15  --   --  19  CREATININE 0.97 0.98 1.07   < > 0.70 0.80  --  0.60*  --  0.97  --   --  1.00  CALCIUM 8.9 9.1 9.2  --   --   --   --   --   --  8.2*  --   --  8.4*  MG  --   --   --   --   --   --   --   --   --  3.0*  --   --  2.6*   < > = values in this interval not displayed.    GFR: Estimated Creatinine Clearance: 71.1 mL/min (by C-G formula based on SCr of 1 mg/dL). Recent Labs  Lab 12/18/22 0034 12/18/22 1342 12/18/22 1941 12/19/22 0355  WBC 10.6* 15.4* 18.4* 16.8*       Critical care time:     Julian Hy, DO 12/19/22 7:46 AM [facility]  For contact information, see Amion. If no response to pager, please call PCCM consult pager. After hours, 7PM- 7AM, please call Elink.

## 2022-12-19 NOTE — Discharge Summary (Signed)
[address]       [place] [address]             [phone]    Physician Discharge Summary  Patient ID: Nicholas Dominguez MRN: [id number] DOB/AGE: 1951/05/30 72 y.o.  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[place]) 09/21/2022   SBO (small bowel obstruction) ([place]) 09/19/2022   Solitary pulmonary nodule on lung CT 09/18/2022   Abnormal CT scan, small bowel 09/14/2022   Chronic cough 09/04/2022   Weight loss 09/04/2022   Cigarette smoker 09/04/2022   GERD (gastroesophageal reflux disease) 03/15/2022     Discharge Diagnoses:  Patient Active Problem List   Diagnosis Date Noted   Centrilobular emphysema ([place]) 12/19/2022   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   ST elevation myocardial infarction ([name]) ([place]) 12/14/2022   Protein-calorie malnutrition, severe 09/23/2022   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  He presented to the [facility] emergency room yesterday after experiencing left-sided chest pain with radiation to his left jaw and associated shortness of breath while doing some yard work.  Initial EKG showed ST elevations in aVR and aVL with inferior ST  depressions.  Initial high-sensitivity troponin was 44 and later rose to greater than 900.  The chest pain subsided.  He was started on aspirin, nitroglycerin, and heparin infusion.  He was transferred to [facility] for further evaluation.  Left heart catheterization was conducted yesterday demonstrating high-grade stenoses in the left anterior descending coronary diagonal, and second OM coronary arteries.  The RCA had a 55% lesion that was evaluated with a flow catheter showing RFR of 0.93. Dr. Ali Lowe asked that we evaluate Nicholas Dominguez for consideration of surgical coronary revascularization. Nicholas Dominguez is currently resting in bed.  His wife is at the bedside.  Nicholas Dominguez denies any chest pain since initial treatment in the ED blood.  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  He had a laparotomy about 12 weeks ago for management of small bowel obstruction requiring a segmental small bowel resection.  He recovered well from the procedure before the obstruction was diagnosed and treated.  He otherwise considers himself healthy.  He was only taking aspirin and Prilosec prior to admission.  Dr. Kipp Brood reviewed the patient's diagnostic studies and determined surgery would provide the best long term treatment. He reviewed the patient's treatment options as well as the risks and benefits of surgery. Nicholas Dominguez was agreeable to proceed with surgery.  Hospital Course: Nicholas Dominguez was an inpatient at [facility] and was brought to the operating room on 12/18/22. He underwent CABG x 3 utilizing LIMA to LAD, SVG to OM1, and SVG to Diagonal as well as  endoscopic harvest of the right greater saphenous vein. He tolerated the procedure well and was transferred to the SICU in stable condition. He was extubated the evening of surgery without complication. His drips were weaned as hemodynamics tolerated. Insulin drip was discontinued and he was started on SSI. He was started on  Lopressor which was titrated as able. Plavix was started due to [name]. He was volume overload and diuresed accordingly. His chest tubes were removed without complication. He had expected postoperative blood loss anemia and was started on an iron supplement. He was felt stable for transfer to the progressive unit. He converted to atrial fibrillation with RVR, he was started on Amiodarone gtt and Lopressor was titrated. He chemically converted to sinus rhythm. Amiodarone gtt was transitioned to PO Amiodarone. He was ambulating well. He is maintaining SR on Lopressor 25 mg bid, Amiodarone 400 mg bid. Sternal and RLE wounds are clean, dry, healing without signs of infection. He has mild phlebitis from IV Amiodarone right forearm;no need for antibiotic. He has been ambulating on room air with good oxygenation. As discussed with Dr. Kipp Brood, he is stable for discharge today.  Consults:  Critical Care  Significant Diagnostic Studies:   CORONARY ANGIOGRAPHY  INTRAVASCULAR PRESSURE WIRE/FFR STUDY   Conclusion   1st Diag lesion is 70% stenosed.   Prox LAD lesion is 80% stenosed.   2nd Mrg lesion is 80% stenosed.   Prox RCA to Mid RCA lesion is 55% stenosed.   Multivessel disease consisting of proximal LAD, first diagonal, and second obtuse marginal lesions.  There is moderate disease of the mid right coronary artery which is not significant with an RFR of 0.93.   ECHOCARDIOGRAM REPORT    Patient Name:   Nicholas Dominguez Date of Exam: 12/15/2022 Medical Rec #:  [id number]     Height:       71.0 in Accession #:    [id number]    Weight:       176.9 lb Date of Birth:  [birth year]     BSA:          2.001 m Patient Age:    89 years      BP:           119/71 mmHg Patient Gender: M             HR:           72 bpm. Exam Location:  Inpatient  Procedure: 2D Echo, Cardiac Doppler and Color Doppler  Indications:    CAD of native vessel   History:        Patient has no prior history of  Echocardiogram examinations.   Sonographer:    Phineas Douglas Referring Phys: Lucile Crater LIGHTFOOT  IMPRESSIONS   1. Left ventricular ejection fraction, by estimation, is 50 to 55%. The left ventricle has low normal function. The left ventricle has no regional wall motion abnormalities. There is moderate left ventricular hypertrophy. Left ventricular diastolic parameters are consistent with Grade I diastolic dysfunction (impaired relaxation).  2. Right ventricular systolic function is normal. The right ventricular size is normal. There is normal pulmonary artery systolic pressure. The estimated right ventricular systolic pressure is 0000000 mmHg.  3. The mitral valve is normal in structure. Trivial mitral valve regurgitation. No evidence of mitral stenosis.  4. The aortic valve is normal in structure. Aortic valve regurgitation is not visualized. No aortic stenosis is present.  5. The inferior vena cava is dilated in size with >50% respiratory variability, suggesting  right atrial pressure of 8 mmHg.  FINDINGS  Left Ventricle: Left ventricular ejection fraction, by estimation, is 50 to 55%. The left ventricle has low normal function. The left ventricle has no regional wall motion abnormalities. The left ventricular internal cavity size was normal in size. There is moderate left ventricular hypertrophy. Left ventricular diastolic parameters are consistent with Grade I diastolic dysfunction (impaired relaxation).  LV Wall Scoring: The mid and distal anterior septum is akinetic.  Right Ventricle: The right ventricular size is normal. No increase in right ventricular wall thickness. Right ventricular systolic function is normal. There is normal pulmonary artery systolic pressure. The tricuspid regurgitant velocity is 2.10 m/s, and  with an assumed right atrial pressure of 8 mmHg, the estimated right ventricular systolic pressure is 0000000 mmHg.  Left Atrium: Left atrial size was normal  in size.  Right Atrium: Right atrial size was normal in size.  Pericardium: There is no evidence of pericardial effusion.  Mitral Valve: The mitral valve is normal in structure. Trivial mitral valve regurgitation. No evidence of mitral valve stenosis.  Tricuspid Valve: The tricuspid valve is normal in structure. Tricuspid valve regurgitation is trivial. No evidence of tricuspid stenosis.  Aortic Valve: The aortic valve is normal in structure. Aortic valve regurgitation is not visualized. No aortic stenosis is present.  Pulmonic Valve: The pulmonic valve was normal in structure. Pulmonic valve regurgitation is not visualized. No evidence of pulmonic stenosis.  Aorta: The aortic root is normal in size and structure.  Venous: The inferior vena cava is dilated in size with greater than 50% respiratory variability, suggesting right atrial pressure of 8 mmHg.  IAS/Shunts: No atrial level shunt detected by color flow Doppler.    LEFT VENTRICLE PLAX 2D LVIDd:         4.40 cm      Diastology LVIDs:         3.40 cm      LV e' medial:    7.72 cm/s LV PW:         1.40 cm      LV E/e' medial:  7.3 LV IVS:        1.30 cm      LV e' lateral:   11.40 cm/s LVOT diam:     2.20 cm      LV E/e' lateral: 5.0 LV SV:         71 LV SV Index:   35 LVOT Area:     3.80 cm   LV Volumes (MOD) LV vol d, MOD A2C: 142.0 ml LV vol d, MOD A4C: 139.0 ml LV vol s, MOD A2C: 65.3 ml LV vol s, MOD A4C: 61.8 ml LV SV MOD A2C:     76.7 ml LV SV MOD A4C:     139.0 ml LV SV MOD BP:      79.2 ml  RIGHT VENTRICLE             IVC RV Basal diam:  3.80 cm     IVC diam: 2.60 cm RV S prime:     19.80 cm/s TAPSE (M-mode): 3.0 cm  LEFT ATRIUM             Index        RIGHT ATRIUM           Index LA diam:        3.40 cm 1.70 cm/m   RA Area:     15.00 cm LA Vol (A2C):   67.6 ml 33.78  ml/m  RA Volume:   37.20 ml  18.59 ml/m LA Vol (A4C):   57.2 ml 28.58 ml/m LA Biplane Vol: 62.2 ml 31.08 ml/m  AORTIC  VALVE LVOT Vmax:   88.60 cm/s LVOT Vmean:  60.500 cm/s LVOT VTI:    0.186 m   AORTA Ao Root diam: 3.70 cm Ao Asc diam:  3.60 cm  MITRAL VALVE               TRICUSPID VALVE MV Area (PHT): 2.38 cm    TV Peak grad:   17.6 mmHg MV Decel Time: 319 msec    TV Vmax:        2.10 m/s MV E velocity: 56.60 cm/s  TR Peak grad:   17.6 mmHg MV A velocity: 57.10 cm/s  TR Vmax:        210.00 cm/s MV E/A ratio:  0.99                            SHUNTS                            Systemic VTI:  0.19 m                            Systemic Diam: 2.20 cm  Candee Furbish MD Electronically signed by Candee Furbish MD Signature Date/Time: 12/15/2022/11:43:28 AM    Final     Treatments: surgery: 12/18/2022 Patient:  Nicholas Dominguez Pre-Op Dx: 3V CAD [name] HTN HLP    Post-op Dx:  Same Procedure: CABG X 3.  LIMA LAD, RSVG Diag, OM2   Endoscopic greater saphenous vein harvest on the right  Discharge Exam: Blood pressure (!) 114/55, pulse 79, temperature 98.4 F (36.9 C), temperature source Oral, resp. rate (!) 21, height '5\' 11"'$  (1.803 m), weight 81.4 kg, SpO2 (!) 87 %.    Discharge Medications:  The patient has been discharged on: Cardiovascular: RRR Pulmonary: Clear to auscultation bilaterally Abdomen: Soft, non tender, bowel sounds present. Extremities: Mild bilateral lower extremity edema. Wounds: Clean and dry.  No erythema or signs of infection.  1.Beta Blocker:  Yes [  X ]                              No   [   ]                              If No, reason:  2.Ace Inhibitor/ARB: Yes [   ]                                     No  [  x  ]                                     If No, reason:Labile BP  3.Statin:   Yes [ X ]                  No  [   ]                  If No, reason:  4.Ecasa:  Yes  [ X ]                  No   [   ]                  If No, reason:  Patient had ACS upon admission: Yes  Plavix/P2Y12 inhibitor: Yes [  X ]                                      No  [    ]     Discharge Instructions     Amb Referral to Cardiac Rehabilitation   Complete by: As directed    Diagnosis: CABG   CABG X ___: 3   After initial evaluation and assessments completed: Virtual Based Care may be provided alone or in conjunction with Phase 2 Cardiac Rehab based on patient barriers.: Yes   Intensive Cardiac Rehabilitation (ICR) [place] location only OR Traditional Cardiac Rehabilitation (TCR) *If criteria for ICR are not met will enroll in TCR [facility] only): Yes      Allergies as of 12/23/2022   No Known Allergies      Medication List     STOP taking these medications    omeprazole 40 MG capsule Commonly known as: PRILOSEC       TAKE these medications    amiodarone 200 MG tablet Commonly known as: PACERONE Take 400 mg two times daily for 3 days;then take 200 mg two times daily for one week;then take 200 mg daily thereafter   aspirin EC 81 MG tablet Take 81 mg by mouth daily.   atorvastatin 80 MG tablet Commonly known as: LIPITOR Take 1 tablet (80 mg total) by mouth daily.   clopidogrel 75 MG tablet Commonly known as: PLAVIX Take 1 tablet (75 mg total) by mouth daily.   ferrous sulfate 325 (65 FE) MG EC tablet Take 1 tablet (325 mg total) by mouth daily with breakfast. For one month. May stop sooner if develops constipation   furosemide 20 MG tablet Commonly known as: LASIX Take 1 tablet (20 mg total) by mouth 2 (two) times daily. For 5 days then stop.   metoprolol tartrate 25 MG tablet Commonly known as: LOPRESSOR Take 1 tablet (25 mg total) by mouth 2 (two) times daily.   multivitamin tablet Take 1 tablet by mouth daily.   NIACIN PO Take by mouth. Otc one daily   potassium chloride 10 MEQ tablet Commonly known as: KLOR-CON M Take 1 tablet (10 mEq total) by mouth 2 (two) times daily. For 5 days then stop.   traMADol 50 MG tablet Commonly known as: ULTRAM Take 1 tablet (50 mg total) by mouth every 6 (six) hours as needed for moderate  pain.               Durable Medical Equipment  (From admission, onward)           Start     Ordered   12/22/22 0735  For home use only DME 4 wheeled rolling walker with seat  Once       Question:  Patient needs a walker to treat with the following condition  Answer:  Physical deconditioning   12/22/22 0734            Follow-up Information     Lajuana Matte, MD Follow up on 12/29/2022.   Specialty: Cardiothoracic  Surgery Why: Virtual appointment is at 2:00PM. Please do NOT come to the office as this is a VIRTUAL appointment, Dr. Kipp Brood will call you. Contact information: [place] [address] [phone]         Marylu Lund., NP Follow up on 01/05/2023.   Specialty: Cardiology Why: Cardiology follow up at 10:05AM Contact information: [address] [place] [address] [phone]                 Signed:  Arnoldo Lenis  12/23/2022, 9:59 AM

## 2022-12-19 NOTE — Discharge Instructions (Signed)

## 2022-12-19 NOTE — Progress Notes (Signed)
Rounding Note    Patient Name: Nicholas Dominguez Date of Encounter: 12/19/2022  [facility] Cardiologist: None new- Dr Ali Lowe  Subjective   Sore when takes a deep breath. Otherwise doing well. No dyspnea.   Inpatient Medications    Scheduled Meds:  acetaminophen  1,000 mg Oral Q6H   Or   acetaminophen (TYLENOL) oral liquid 160 mg/5 mL  1,000 mg Per Tube Q6H   arformoterol  15 mcg Nebulization BID   aspirin EC  325 mg Oral Daily   Or   aspirin  324 mg Per Tube Daily   atorvastatin  80 mg Oral Daily   bisacodyl  10 mg Oral Daily   Or   bisacodyl  10 mg Rectal Daily   Chlorhexidine Gluconate Cloth  6 each Topical Daily   docusate sodium  200 mg Oral Daily   metoprolol tartrate  12.5 mg Oral BID   Or   metoprolol tartrate  12.5 mg Per Tube BID   [START ON 12/20/2022] pantoprazole  40 mg Oral Daily   revefenacin  175 mcg Nebulization Daily   sodium chloride flush  3 mL Intravenous Q12H   Continuous Infusions:  sodium chloride 10 mL/hr at 12/19/22 0700   sodium chloride     sodium chloride      ceFAZolin (ANCEF) IV Stopped (12/19/22 0537)   dexmedetomidine (PRECEDEX) IV infusion Stopped (12/18/22 1605)   famotidine (PEPCID) IV Stopped (12/18/22 1421)   insulin 1.6 Units/hr (12/19/22 0700)   lactated ringers     lactated ringers     lactated ringers 20 mL/hr at 12/19/22 0700   nitroGLYCERIN Stopped (12/18/22 2209)   phenylephrine (NEO-SYNEPHRINE) Adult infusion Stopped (12/19/22 [id number])   PRN Meds: sodium chloride, dextrose, lactated ringers, metoprolol tartrate, midazolam, morphine injection, ondansetron (ZOFRAN) IV, mouth rinse, mouth rinse, oxyCODONE, sodium chloride flush, traMADol   Vital Signs    Vitals:   12/19/22 0500 12/19/22 0550 12/19/22 0600 12/19/22 0700  BP: 104/67  (!) 96/59 124/70  Pulse: (!) 101 96 96 (!) 111  Resp: (!) 21 (!) 22 16 (!) 26  Temp: 99.3 F (37.4 C) 99.3 F (37.4 C) 99.5 F (37.5 C)   TempSrc:      SpO2: 99% 98% 97% 92%   Weight: 84.1 kg     Height:        Intake/Output Summary (Last 24 hours) at 12/19/2022 0738 Last data filed at 12/19/2022 0700 Gross per 24 hour  Intake 5851.54 ml  Output 3602 ml  Net 2249.54 ml      12/19/2022    5:00 AM 12/18/2022    6:00 AM 12/17/2022    5:05 AM  Last 3 Weights  Weight (lbs) 185 lb 8 oz 176 lb 12.8 oz 178 lb 3.2 oz  Weight (kg) 84.142 kg 80.196 kg 80.831 kg      Telemetry    NSR - Personally Reviewed  ECG    NSR, no acute ST changes - Personally Reviewed  Physical Exam   GEN: No acute distress.   Neck: No JVD Cardiac: RRR, no murmurs, rubs, or gallops.  Respiratory: decreased BS in bases GI: Soft, nontender, non-distended  MS: No edema; No deformity. Neuro:  Nonfocal  Psych: Normal affect   Labs    High Sensitivity Troponin:   Recent Labs  Lab 12/14/22 1410 12/14/22 1530  TROPONINIHS 44* 977*     Chemistry Recent Labs  Lab 12/14/22 1530 12/15/22 1010 12/17/22 0656 12/18/22 0807 12/18/22 1245 12/18/22 1340 12/18/22 1941  12/18/22 2051 12/18/22 2214 12/19/22 0355  NA 132*   < > 137   < > 143   < > 138 141 139 138  K 3.5   < > 4.2   < > 4.1   < > 4.1 4.3 4.3 4.4  CL 102   < > 105   < > 111  --  108  --   --  106  CO2 20*   < > 25  --   --   --  24  --   --  21*  GLUCOSE 139*   < > 103*   < > 141*  --  134*  --   --  137*  BUN 14   < > 20   < > 12  --  15  --   --  19  CREATININE 0.97   < > 1.07   < > 0.60*  --  0.97  --   --  1.00  CALCIUM 8.9   < > 9.2  --   --   --  8.2*  --   --  8.4*  MG  --   --   --   --   --   --  3.0*  --   --  2.6*  PROT 6.0*  --   --   --   --   --   --   --   --   --   ALBUMIN 3.5  --   --   --   --   --   --   --   --   --   AST 31  --   --   --   --   --   --   --   --   --   ALT 9  --   --   --   --   --   --   --   --   --   ALKPHOS 76  --   --   --   --   --   --   --   --   --   BILITOT 1.1  --   --   --   --   --   --   --   --   --   GFRNONAA >60   < > >60  --   --   --  >60  --   --  >60   ANIONGAP 10   < > 7  --   --   --  6  --   --  11   < > = values in this interval not displayed.    Lipids  Recent Labs  Lab 12/14/22 1530  CHOL 177  TRIG 36  HDL 55  LDLCALC 115*  CHOLHDL 3.2    Hematology Recent Labs  Lab 12/18/22 1342 12/18/22 1941 12/18/22 2051 12/18/22 2214 12/19/22 0355  WBC 15.4* 18.4*  --   --  16.8*  RBC 2.88* 2.85*  --   --  2.55*  HGB 9.3* 9.4* 9.2* 9.5* 8.5*  HCT 28.7* 27.9* 27.0* 28.0* 25.1*  MCV 99.7 97.9  --   --  98.4  MCH 32.3 33.0  --   --  33.3  MCHC 32.4 33.7  --   --  33.9  RDW 13.4 13.3  --   --  13.7  PLT 189 214  --   --  181  Thyroid No results for input(s): "TSH", "FREET4" in the last 168 hours.  BNPNo results for input(s): "BNP", "PROBNP" in the last 168 hours.  DDimer No results for input(s): "DDIMER" in the last 168 hours.   Radiology    DG Chest Port 1 View  Result Date: 12/18/2022 CLINICAL DATA:  Status post CABG EXAM: PORTABLE CHEST 1 VIEW COMPARISON:  12/14/2022 FINDINGS: Single frontal view of the chest demonstrates endotracheal tube overlying tracheal air column tip at thoracic inlet. Enteric catheter passes below diaphragm tip projecting over the gastric fundus. Side port projects up proximally 2 cm above the gastroesophageal junction. Right internal jugular catheter tip overlies superior vena cava. Mediastinal and pericardial drains are noted. Cardiac silhouette is unremarkable. Patchy consolidation at the left lung base likely atelectasis. Trace left effusion. No pneumothorax. IMPRESSION: 1. Minimal left basilar atelectasis and trace left pleural effusion. 2. Support devices as above. Electronically Signed   By: Randa Ngo M.D.   On: 12/18/2022 14:29   EP STUDY  Result Date: 12/18/2022 See surgical note for result.  VAS US DOPPLER PRE CABG  Result Date: 12/17/2022 PREOPERATIVE VASCULAR EVALUATION Patient Name:  SKYLER SCHRAMEL  Date of Exam:   12/17/2022 Medical Rec #: [id number]      Accession #:    [id number]  Date of Birth: [birth year]      Patient Gender: M Patient Age:   72 years Exam Location:  [facility] Procedure:      VAS US DOPPLER PRE CABG Referring Phys: HARRELL LIGHTFOOT --------------------------------------------------------------------------------  Indications:      Pre-CABG. Risk Factors:     Hyperlipidemia, coronary artery disease. Comparison Study: No prior study on file Performing Technologist: Sharion Dove RVS  Examination Guidelines: A complete evaluation includes B-mode imaging, spectral Doppler, color Doppler, and power Doppler as needed of all accessible portions of each vessel. Bilateral testing is considered an integral part of a complete examination. Limited examinations for reoccurring indications may be performed as noted.  Right Carotid Findings: +----------+--------+--------+--------+-----------+------------------+           PSV cm/sEDV cm/sStenosisDescribe   Comments           +----------+--------+--------+--------+-----------+------------------+ CCA Prox  114     11                         intimal thickening +----------+--------+--------+--------+-----------+------------------+ CCA Distal91      16                         intimal thickening +----------+--------+--------+--------+-----------+------------------+ ICA Prox  84      21              homogeneous                   +----------+--------+--------+--------+-----------+------------------+ ICA Mid   114     31                                            +----------+--------+--------+--------+-----------+------------------+ ICA Distal114     26                                            +----------+--------+--------+--------+-----------+------------------+ ECA       159  13                                            +----------+--------+--------+--------+-----------+------------------+ +----------+--------+-------+--------+------------+           PSV cm/sEDV cmsDescribeArm  Pressure +----------+--------+-------+--------+------------+ Subclavian101                                 +----------+--------+-------+--------+------------+ +---------+--------+--+--------+--+ VertebralPSV cm/s77EDV cm/s14 +---------+--------+--+--------+--+ Left Carotid Findings: +----------+--------+--------+--------+-----------+------------------+           PSV cm/sEDV cm/sStenosisDescribe   Comments           +----------+--------+--------+--------+-----------+------------------+ CCA Prox  133     6                          intimal thickening +----------+--------+--------+--------+-----------+------------------+ CCA Distal114     17                         intimal thickening +----------+--------+--------+--------+-----------+------------------+ ICA Prox  101     24              homogeneous                   +----------+--------+--------+--------+-----------+------------------+ ICA Mid   102     32                                            +----------+--------+--------+--------+-----------+------------------+ ICA Distal101     24                                            +----------+--------+--------+--------+-----------+------------------+ ECA       185     14                                            +----------+--------+--------+--------+-----------+------------------+ +----------+--------+--------+--------+------------+ SubclavianPSV cm/sEDV cm/sDescribeArm Pressure +----------+--------+--------+--------+------------+           130                                  +----------+--------+--------+--------+------------+ +---------+--------+--+--------+-+ VertebralPSV cm/s57EDV cm/s9 +---------+--------+--+--------+-+  ABI Findings: +-----+------------------+-----+-----------+--------+ RightRt Pressure (mmHg)IndexWaveform   Comment  +-----+------------------+-----+-----------+--------+ ATA                          multiphasic         +-----+------------------+-----+-----------+--------+ PTA                         multiphasic         +-----+------------------+-----+-----------+--------+ DP                          multiphasic         +-----+------------------+-----+-----------+--------+ +----+------------------+-----+-----------+-------+ LeftLt Pressure (mmHg)IndexWaveform   Comment +----+------------------+-----+-----------+-------+ ATA  multiphasic        +----+------------------+-----+-----------+-------+ PTA                        multiphasic        +----+------------------+-----+-----------+-------+ DP                         multiphasic        +----+------------------+-----+-----------+-------+  Right Doppler Findings: +------+--------+-----+---------+--------+ Site  PressureIndexDoppler  Comments +------+--------+-----+---------+--------+ Radial             triphasic         +------+--------+-----+---------+--------+ Ulnar              triphasic         +------+--------+-----+---------+--------+  Left Doppler Findings: +------+--------+-----+---------+--------+ Site  PressureIndexDoppler  Comments +------+--------+-----+---------+--------+ Radial             triphasic         +------+--------+-----+---------+--------+ Ulnar              triphasic         +------+--------+-----+---------+--------+   Summary: Right Carotid: The extracranial vessels were near-normal with only minimal wall                thickening or plaque. Left Carotid: The extracranial vessels were near-normal with only minimal wall               thickening or plaque. Vertebrals:  Bilateral vertebral arteries demonstrate antegrade flow. Subclavians: Normal flow hemodynamics were seen in bilateral subclavian              arteries. Right Upper Extremity: Doppler waveform obliterate with right radial compression. Doppler waveforms remain within normal limits  with right ulnar compression. Left Upper Extremity: Doppler waveform obliterate with left radial compression. Doppler waveforms remain within normal limits with left ulnar compression.   Electronically signed by Deitra Mayo MD on 12/17/2022 at 3:38:59 PM.    Final     Cardiac Studies   Echo 12/15/22: IMPRESSIONS     1. Left ventricular ejection fraction, by estimation, is 50 to 55%. The  left ventricle has low normal function. The left ventricle has no regional  wall motion abnormalities. There is moderate left ventricular hypertrophy.  Left ventricular diastolic  parameters are consistent with Grade I diastolic dysfunction (impaired  relaxation).   2. Right ventricular systolic function is normal. The right ventricular  size is normal. There is normal pulmonary artery systolic pressure. The  estimated right ventricular systolic pressure is 0000000 mmHg.   3. The mitral valve is normal in structure. Trivial mitral valve  regurgitation. No evidence of mitral stenosis.   4. The aortic valve is normal in structure. Aortic valve regurgitation is  not visualized. No aortic stenosis is present.   5. The inferior vena cava is dilated in size with >50% respiratory  variability, suggesting right atrial pressure of 8 mmHg.    Cardiac cath 12/15/22:  CORONARY ANGIOGRAPHY  INTRAVASCULAR PRESSURE WIRE/FFR STUDY   Conclusion      1st Diag lesion is 70% stenosed.   Prox LAD lesion is 80% stenosed.   2nd Mrg lesion is 80% stenosed.   Prox RCA to Mid RCA lesion is 55% stenosed.   1.  Multivessel disease consisting of proximal LAD, first diagonal, and second obtuse marginal lesions.  There is moderate disease of the mid right coronary artery which is not significant with an RFR of 0.93.   Recommendation:  Given that the patient has TIMI-3 flow in all vessels and is chest pain-free, the patient will be treated medically and referred for surgical revascularization.  Coronary  Diagrams  Diagnostic Dominance: Right  Intervention  Patient Profile     72 y.o. male with history of tobacco abuse, HLD presented with aborted anterior [name]  Assessment & Plan    CAD with aborted anterior [name]. Cardiac cath with severe LAD, diagonal, and OM disease. EF 50-55%. Now POD 1 CABG x 3 with LIMA to LAD, SVGs to diagonal and OM. Extubated and doing well. On ASA, metoprolol, high dose statin. Would DC on plavix as well for ACS.  Tobacco abuse with COPD. Encourage smoking cessation HLD on high dose statin.      For questions or updates, please contact [place] Please consult [email or web address] for contact info under        Signed, Ryian Lynde [place], MD  12/19/2022, 7:38 AM

## 2022-12-20 DIAGNOSIS — Z951 Presence of aortocoronary bypass graft: Secondary | ICD-10-CM | POA: Diagnosis not present

## 2022-12-20 DIAGNOSIS — R739 Hyperglycemia, unspecified: Secondary | ICD-10-CM | POA: Diagnosis not present

## 2022-12-20 DIAGNOSIS — K219 Gastro-esophageal reflux disease without esophagitis: Secondary | ICD-10-CM

## 2022-12-20 LAB — CBC
HCT: 24.5 % — ABNORMAL LOW (ref 39.0–52.0)
Hemoglobin: 7.7 g/dL — ABNORMAL LOW (ref 13.0–17.0)
MCH: 32.4 pg (ref 26.0–34.0)
MCHC: 31.4 g/dL (ref 30.0–36.0)
MCV: 102.9 fL — ABNORMAL HIGH (ref 80.0–100.0)
Platelets: 183 10*3/uL (ref 150–400)
RBC: 2.38 MIL/uL — ABNORMAL LOW (ref 4.22–5.81)
RDW: 13.7 % (ref 11.5–15.5)
WBC: 13.2 10*3/uL — ABNORMAL HIGH (ref 4.0–10.5)
nRBC: 0 % (ref 0.0–0.2)

## 2022-12-20 LAB — BASIC METABOLIC PANEL
Anion gap: 6 (ref 5–15)
BUN: 19 mg/dL (ref 8–23)
CO2: 26 mmol/L (ref 22–32)
Calcium: 8.2 mg/dL — ABNORMAL LOW (ref 8.9–10.3)
Chloride: 100 mmol/L (ref 98–111)
Creatinine, Ser: 0.94 mg/dL (ref 0.61–1.24)
GFR, Estimated: >60 mL/min (ref >60)
Glucose, Bld: 113 mg/dL — ABNORMAL HIGH (ref 70–99)
Potassium: 4 mmol/L (ref 3.5–5.1)
Sodium: 132 mmol/L — ABNORMAL LOW (ref 135–145)

## 2022-12-20 LAB — ECHO INTRAOPERATIVE TEE
Height: 71 in
S' Lateral: 3.7 cm
Weight: 2828.8 oz

## 2022-12-20 LAB — GLUCOSE, CAPILLARY
Glucose-Capillary: 115 mg/dL — ABNORMAL HIGH (ref 70–99)
Glucose-Capillary: 82 mg/dL (ref 70–99)
Glucose-Capillary: 131 mg/dL — ABNORMAL HIGH (ref 70–99)
Glucose-Capillary: 114 mg/dL — ABNORMAL HIGH (ref 70–99)
Glucose-Capillary: 109 mg/dL — ABNORMAL HIGH (ref 70–99)
Glucose-Capillary: 105 mg/dL — ABNORMAL HIGH (ref 70–99)

## 2022-12-20 MED ORDER — FE FUM-VIT C-VIT B12-FA 460-60-0.01-1 MG PO CAPS
1.0000 | ORAL_CAPSULE | Freq: Every day | ORAL | Status: DC
  Administered 2022-12-20 – 2022-12-23 (×4): 1 via ORAL
  Filled 2022-12-20 (×4): qty 1

## 2022-12-20 MED ORDER — ORAL CARE MOUTH RINSE: 15.0000 mL | OROMUCOSAL | Status: DC | PRN

## 2022-12-20 MED ORDER — SODIUM CHLORIDE 0.9% FLUSH: 3.0000 mL | INTRAVENOUS | Status: DC | PRN

## 2022-12-20 MED ORDER — SODIUM CHLORIDE 0.9 % IV SOLN: 250.0000 mL | INTRAVENOUS | Status: DC | PRN

## 2022-12-20 MED ORDER — SODIUM CHLORIDE 0.9% FLUSH
3.0000 mL | Freq: Two times a day (BID) | INTRAVENOUS | Status: DC
  Administered 2022-12-20 – 2022-12-23 (×7): 3 mL via INTRAVENOUS

## 2022-12-20 MED ORDER — ~~LOC~~ CARDIAC SURGERY, PATIENT & FAMILY EDUCATION: Freq: Once | Status: AC

## 2022-12-20 NOTE — Progress Notes (Signed)
NAME:  Nicholas Dominguez, MRN:  [id number], DOB:  1951-05-24, LOS: 6 ADMISSION DATE:  12/14/2022, CONSULTATION DATE:  2/19 REFERRING MD:  Kipp Brood, CHIEF COMPLAINT:  CABG post [name]   History of Present Illness:  Nicholas Dominguez is a 72 y/o gentleman with a history of HTN, HLD, former tobacco abuse who presented to the hospital on 2/15 with SOB and left jaw pain, found to have a [name]. He underwent LHC emergently demonstrating 3-vessel disease. Due to adequate coronary flow at the time of catherization he did not have stents placed and was referred for surgical revascularization. On 2/18 he developed CP walking with PT that improved with SL NTG and he was started on nitroglycerin infusion until his CABG. In the OR he had revascularization with LIMA to LAD, SVG to OM2, SVG to diag. EBL 500cc, no blood products required in OR.   Pertinent  Medical History  CAD HLD HTN Tobacco abuse  Significant Hospital Events: Including procedures, antibiotic start and stop dates in addition to other pertinent events   2/15 admission, LHC, remained on heparin 2/18 started NTG infusion  Interim History / Subjective:  Feels well, appetite is good, pain is adequately controlled.   Objective   Blood pressure 124/78, pulse 100, temperature 98.5 F (36.9 C), temperature source Oral, resp. rate (!) 24, height 5' 11"$  (1.803 m), weight 83.9 kg, SpO2 93 %. CVP:  [3 mmHg-13 mmHg] 7 mmHg      Intake/Output Summary (Last 24 hours) at 12/20/2022 0714 Last data filed at 12/20/2022 0645 Gross per 24 hour  Intake 832.89 ml  Output 1399 ml  Net -566.11 ml    Filed Weights   12/18/22 0600 12/19/22 0500 12/20/22 0500  Weight: 80.2 kg 84.1 kg 83.9 kg    Examination: General: elderly man sitting up in the recliner in NAD HENT: [place]/AT, eyes anicteric Lungs: breathing comfortably on RA, CTAB, breath sounds reduced in the bases Cardiovascular: s1S2, RRR. Minimal output from chest tubes.  Abdomen: soft, NT Extremities:  no  c/c/e Neuro: awake, alert, moving extremities. Hard of hearing. Derm: warm, dry, no rashes  Chest tube output 50cc over last 12 hrs  Na+ 132 K+ 4 BUN 19 Cr 1.0 WBC 13.2 H/H 7.7/24.5 Platelets 183  Resolved Hospital Problem list     Assessment & Plan:  [name], CAD with 3-vessel disease s/p CABG x 3 -post-op care per TCTS; d/c chest tubes & CVC today -pain control per protocol -progressive mobility -statin, aspirin, plavix, metoprolol -pain control per protocol -progress mobility  Emphysema - monitor for symptoms; can resume LAMA and/ or LABA if DOE, wheezing, uncontrolled cough  49m RLL pulmonary nodule seen on 08/2022 CT scan; history of tobacco abuse (quit Nov 2023) -Needs outpatient follow-up CT scan scheduled at discharge (scheduled 01/26/23)  Acute blood loss anemia, expected operative blood loss -defer transfusion decisions to TCTS; plan to transfuse for Hb <7   Hyperglycemia, expected postop. A1c 5.3 -SSI PRN- minimal insulin requirements  GERD -con't PPI (PTA med)  PCCM will sign off when out of the ICU. Please call with questions.  Best Practice (right click and "Reselect all SmartList Selections" daily)   Diet/type: Regular consistency (see orders) DVT prophylaxis: LMWH GI prophylaxis: PPI Lines: N/A Foley:  N/A Code Status:  full code Last date of multidisciplinary goals of care discussion [ ]$   Labs   CBC: Recent Labs  Lab 12/18/22 1342 12/18/22 1941 12/18/22 2051 12/18/22 2214 12/19/22 0355 12/19/22 1555 12/20/22 0430  WBC 15.4* 18.4*  --   --  16.8* 17.5* 13.2*  HGB 9.3* 9.4* 9.2* 9.5* 8.5* 8.6* 7.7*  HCT 28.7* 27.9* 27.0* 28.0* 25.1* 25.8* 24.5*  MCV 99.7 97.9  --   --  98.4 100.0 102.9*  PLT 189 214  --   --  181 204 183     Basic Metabolic Panel: Recent Labs  Lab 12/17/22 0656 12/18/22 0807 12/18/22 1245 12/18/22 1340 12/18/22 1941 12/18/22 2051 12/18/22 2214 12/19/22 0355 12/19/22 1555 12/20/22 0430  NA 137   < > 143    < > 138 141 139 138 135 132*  K 4.2   < > 4.1   < > 4.1 4.3 4.3 4.4 4.3 4.0  CL 105   < > 111  --  108  --   --  106 104 100  CO2 25  --   --   --  24  --   --  21* 25 26  GLUCOSE 103*   < > 141*  --  134*  --   --  137* 145* 113*  BUN 20   < > 12  --  15  --   --  19 22 19  $ CREATININE 1.07   < > 0.60*  --  0.97  --   --  1.00 1.08 0.94  CALCIUM 9.2  --   --   --  8.2*  --   --  8.4* 8.6* 8.2*  MG  --   --   --   --  3.0*  --   --  2.6* 2.5*  --    < > = values in this interval not displayed.    GFR: Estimated Creatinine Clearance: 75.7 mL/min (by C-G formula based on SCr of 0.94 mg/dL). Recent Labs  Lab 12/18/22 1941 12/19/22 0355 12/19/22 1555 12/20/22 0430  WBC 18.4* 16.8* 17.5* 13.2*       Critical care time:     Julian Hy, DO 12/20/22 1:17 PM [facility]  For contact information, see Amion. If no response to pager, please call PCCM consult pager. After hours, 7PM- 7AM, please call [place].

## 2022-12-20 NOTE — Progress Notes (Addendum)
TCTS DAILY ICU PROGRESS NOTE                   [place].[address]            [place] [address]          [phone]   2 Days Post-Op Procedure(s) (LRB): CORONARY ARTERY BYPASS GRAFTING (CABG) X THREE USING LEFT INTERNAL MAMMARY ARTERY AND RIGHT GREATER SAPHENOUS LEG VEIN HARVESTED ENDOSCOPICALLY. (N/A) TRANSESOPHAGEAL ECHOCARDIOGRAM (N/A)  Total Length of Stay:  LOS: 6 days   Subjective: Minor discomforts but mostly feels well  Objective: Vital signs in last 24 hours: Temp:  [98.1 F (36.7 C)-98.8 F (37.1 C)] 98.5 F (36.9 C) (02/21 0335) Pulse Rate:  [86-108] 100 (02/21 0700) Cardiac Rhythm: Normal sinus rhythm;Sinus tachycardia (02/21 0430) Resp:  [15-35] 24 (02/21 0700) BP: (93-131)/(63-78) 124/78 (02/21 0700) SpO2:  [91 %-98 %] 93 % (02/21 0700) Arterial Line BP: (102-162)/(51-64) 153/53 (02/20 0912) Weight:  [83.9 kg] 83.9 kg (02/21 0500)  Filed Weights   12/18/22 0600 12/19/22 0500 12/20/22 0500  Weight: 80.2 kg 84.1 kg 83.9 kg    Weight change: -0.242 kg   Hemodynamic parameters for last 24 hours: CVP:  [3 mmHg-13 mmHg] 7 mmHg  Intake/Output from previous day: 02/20 0701 - 02/21 0700 In: 832.9 [P.O.:250; I.V.:282.8; IV Piggyback:300.1] Out: 1399 [Urine:1259; Chest Tube:140]  Intake/Output this shift: No intake/output data recorded.  Current Meds: Scheduled Meds:  acetaminophen  1,000 mg Oral Q6H   Or   acetaminophen (TYLENOL) oral liquid 160 mg/5 mL  1,000 mg Per Tube Q6H   aspirin EC  81 mg Oral Daily   atorvastatin  80 mg Oral Daily   bisacodyl  10 mg Oral Daily   Or   bisacodyl  10 mg Rectal Daily   Chlorhexidine Gluconate Cloth  6 each Topical Daily   clopidogrel  75 mg Oral Daily   docusate sodium  200 mg Oral Daily   enoxaparin (LOVENOX) injection  40 mg Subcutaneous QHS   insulin aspart  0-24 Units Subcutaneous Q4H   metoprolol tartrate  12.5 mg Oral BID   Or   metoprolol tartrate  12.5 mg Per Tube BID   pantoprazole  40 mg  Oral Daily   sodium chloride flush  10-40 mL Intracatheter Q12H   sodium chloride flush  3 mL Intravenous Q12H   Continuous Infusions:  sodium chloride 10 mL/hr at 12/20/22 0600   sodium chloride     sodium chloride     dexmedetomidine (PRECEDEX) IV infusion Stopped (12/18/22 1605)   lactated ringers     lactated ringers     lactated ringers Stopped (12/19/22 0857)   nitroGLYCERIN Stopped (12/18/22 2209)   phenylephrine (NEO-SYNEPHRINE) Adult infusion Stopped (12/19/22 [id number])   PRN Meds:.sodium chloride, lactated ringers, metoprolol tartrate, midazolam, morphine injection, ondansetron (ZOFRAN) IV, mouth rinse, oxyCODONE, sodium chloride flush, sodium chloride flush, traMADol  General appearance: alert, cooperative, and no distress Heart: regular rate and rhythm Lungs: mildly dim in bases Abdomen: benign Extremities: no edema Wound: dressings CDI  Lab Results: CBC: Recent Labs    12/19/22 1555 12/20/22 0430  WBC 17.5* 13.2*  HGB 8.6* 7.7*  HCT 25.8* 24.5*  PLT 204 183   BMET:  Recent Labs    12/19/22 1555 12/20/22 0430  NA 135 132*  K 4.3 4.0  CL 104 100  CO2 25 26  GLUCOSE 145* 113*  BUN 22 19  CREATININE 1.08 0.94  CALCIUM 8.6* 8.2*  CMET: Lab Results  Component Value Date   WBC 13.2 (H) 12/20/2022   HGB 7.7 (L) 12/20/2022   HCT 24.5 (L) 12/20/2022   PLT 183 12/20/2022   GLUCOSE 113 (H) 12/20/2022   CHOL 177 12/14/2022   TRIG 36 12/14/2022   HDL 55 12/14/2022   LDLCALC 115 (H) 12/14/2022   ALT 9 12/14/2022   AST 31 12/14/2022   NA 132 (L) 12/20/2022   K 4.0 12/20/2022   CL 100 12/20/2022   CREATININE 0.94 12/20/2022   BUN 19 12/20/2022   CO2 26 12/20/2022   TSH 1.030 09/04/2022   INR 1.3 (H) 12/18/2022   HGBA1C 5.3 12/14/2022      PT/INR:  Recent Labs    12/18/22 1342  LABPROT 16.4*  INR 1.3*   Radiology: No results found.   Assessment/Plan: S/P Procedure(s) (LRB): CORONARY ARTERY BYPASS GRAFTING (CABG) X THREE USING LEFT  INTERNAL MAMMARY ARTERY AND RIGHT GREATER SAPHENOUS LEG VEIN HARVESTED ENDOSCOPICALLY. (N/A) TRANSESOPHAGEAL ECHOCARDIOGRAM (N/A) POD#2  1 afeb, S BP 90's-130's, SR/ST 2 O2 sats good on 2 liters 3 good UOP, weight stable, approx 4 KG >preop 4 minor hyponatremia- monitor 5 normal renal fxn 6 expected ABLA - approaching transfusion threshold H/H 7.7/24.5 , but tolerating well clinically - cont to monitor , start Iron 7 BS well controlled 8 CT 140 cc- d/c today 9 routine progression , pulm hygiene and rehab, poss tx to 4 e  John Giovanni PA-C Pager [phone] 12/20/2022 7:31 AM  Agree with above Will remove Cts today Floor today  Lajuana Matte

## 2022-12-20 NOTE — Progress Notes (Signed)
   12/20/22 1619  Vitals  Temp 98.2 F (36.8 C)  Temp Source Oral  BP 123/80  MAP (mmHg) 91  BP Location Right Arm  BP Method Automatic  Patient Position (if appropriate) Lying  Pulse Rate (!) 106  ECG Heart Rate (!) 106  Resp 16  MEWS COLOR  MEWS Score Color Green  Oxygen Therapy  SpO2 95 %  O2 Device Nasal Cannula  O2 Flow Rate (L/min) 2 L/min  MEWS Score  MEWS Temp 0  MEWS Systolic 0  MEWS Pulse 1  MEWS RR 0  MEWS LOC 0  MEWS Score 1   Patient arrived from [place] to 4e22, patient placed on monitor and vital signs obatined. Patient in chair call bell within reach. Jalaysha Skilton, Bettina Gavia RN

## 2022-12-21 ENCOUNTER — Inpatient Hospital Stay (HOSPITAL_COMMUNITY): Payer: Medicare Other

## 2022-12-21 DIAGNOSIS — Z006 Encounter for examination for normal comparison and control in clinical research program: Secondary | ICD-10-CM

## 2022-12-21 LAB — TYPE AND SCREEN
ABO/RH(D): A POS
Antibody Screen: NEGATIVE
Unit division: 0
Unit division: 0

## 2022-12-21 LAB — BASIC METABOLIC PANEL
Anion gap: 6 (ref 5–15)
BUN: 21 mg/dL (ref 8–23)
CO2: 24 mmol/L (ref 22–32)
Calcium: 8.3 mg/dL — ABNORMAL LOW (ref 8.9–10.3)
Chloride: 104 mmol/L (ref 98–111)
Creatinine, Ser: 0.95 mg/dL (ref 0.61–1.24)
GFR, Estimated: >60 mL/min (ref >60)
Glucose, Bld: 114 mg/dL — ABNORMAL HIGH (ref 70–99)
Potassium: 3.9 mmol/L (ref 3.5–5.1)
Sodium: 134 mmol/L — ABNORMAL LOW (ref 135–145)

## 2022-12-21 LAB — BPAM RBC
Blood Product Expiration Date: 202403052359
Blood Product Expiration Date: 202403052359
Unit Type and Rh: 6200
Unit Type and Rh: 6200

## 2022-12-21 LAB — CBC
HCT: 23 % — ABNORMAL LOW (ref 39.0–52.0)
Hemoglobin: 7.5 g/dL — ABNORMAL LOW (ref 13.0–17.0)
MCH: 32.6 pg (ref 26.0–34.0)
MCHC: 32.6 g/dL (ref 30.0–36.0)
MCV: 100 fL (ref 80.0–100.0)
Platelets: 202 10*3/uL (ref 150–400)
RBC: 2.3 MIL/uL — ABNORMAL LOW (ref 4.22–5.81)
RDW: 13.4 % (ref 11.5–15.5)
WBC: 12.4 10*3/uL — ABNORMAL HIGH (ref 4.0–10.5)
nRBC: 0 % (ref 0.0–0.2)

## 2022-12-21 LAB — GLUCOSE, CAPILLARY
Glucose-Capillary: 101 mg/dL — ABNORMAL HIGH (ref 70–99)
Glucose-Capillary: 104 mg/dL — ABNORMAL HIGH (ref 70–99)
Glucose-Capillary: 114 mg/dL — ABNORMAL HIGH (ref 70–99)
Glucose-Capillary: 127 mg/dL — ABNORMAL HIGH (ref 70–99)
Glucose-Capillary: 130 mg/dL — ABNORMAL HIGH (ref 70–99)
Glucose-Capillary: 139 mg/dL — ABNORMAL HIGH (ref 70–99)
Glucose-Capillary: 139 mg/dL — ABNORMAL HIGH (ref 70–99)

## 2022-12-21 MED ORDER — POTASSIUM CHLORIDE CRYS ER 10 MEQ PO TBCR
10.0000 meq | EXTENDED_RELEASE_TABLET | Freq: Two times a day (BID) | ORAL | Status: DC
  Administered 2022-12-21 – 2022-12-23 (×5): 10 meq via ORAL
  Filled 2022-12-21 (×5): qty 1

## 2022-12-21 MED ORDER — FUROSEMIDE 20 MG PO TABS
20.0000 mg | ORAL_TABLET | Freq: Two times a day (BID) | ORAL | Status: DC
  Administered 2022-12-21 – 2022-12-23 (×5): 20 mg via ORAL
  Filled 2022-12-21 (×5): qty 1

## 2022-12-21 MED ORDER — AMIODARONE HCL IN DEXTROSE 360-4.14 MG/200ML-% IV SOLN
60.0000 mg/h | INTRAVENOUS | Status: AC
  Administered 2022-12-21 (×2): 60 mg/h via INTRAVENOUS
  Filled 2022-12-21: qty 200

## 2022-12-21 MED ORDER — AMIODARONE HCL IN DEXTROSE 360-4.14 MG/200ML-% IV SOLN
30.0000 mg/h | INTRAVENOUS | Status: DC
  Administered 2022-12-21 – 2022-12-22 (×2): 30 mg/h via INTRAVENOUS
  Filled 2022-12-21 (×3): qty 200

## 2022-12-21 MED ORDER — AMIODARONE LOAD VIA INFUSION
150.0000 mg | Freq: Once | INTRAVENOUS | Status: AC
  Administered 2022-12-21: 150 mg via INTRAVENOUS

## 2022-12-21 MED ORDER — AMIODARONE IV BOLUS ONLY 150 MG/100ML
150.0000 mg | Freq: Once | INTRAVENOUS | Status: DC
  Filled 2022-12-21: qty 100

## 2022-12-21 MED ORDER — METOPROLOL TARTRATE 25 MG PO TABS
25.0000 mg | ORAL_TABLET | Freq: Two times a day (BID) | ORAL | Status: DC
  Administered 2022-12-21 – 2022-12-23 (×4): 25 mg via ORAL
  Filled 2022-12-21 (×4): qty 1

## 2022-12-21 NOTE — Progress Notes (Signed)
PT Cancellation Note  Patient Details Name: Nicholas Dominguez MRN: [id number] DOB: 1950-11-13   Cancelled Treatment:    Reason Eval/Treat Not Completed: Medical issues which prohibited therapy. Pt now with afib with rvr. RHR 120's. Up to 150's earlier with chair to bed. Meds started. Will return later today.   Shary Decamp [facility] 12/21/2022, 9:50 AM [place] Office [phone]

## 2022-12-21 NOTE — Care Management Important Message (Signed)
Important Message  Patient Details  Name: Nicholas Dominguez MRN: [id number] Date of Birth: 1951-06-25   Medicare Important Message Given:        Shelda Altes 12/21/2022, 9:24 AM

## 2022-12-21 NOTE — Progress Notes (Addendum)
[address]       [place] [address]             [phone]      3 Days Post-Op Procedure(s) (LRB): CORONARY ARTERY BYPASS GRAFTING (CABG) X THREE USING LEFT INTERNAL MAMMARY ARTERY AND RIGHT GREATER SAPHENOUS LEG VEIN HARVESTED ENDOSCOPICALLY. (N/A) TRANSESOPHAGEAL ECHOCARDIOGRAM (N/A) Subjective: Patient states he has some chest soreness when he coughs.   Objective: Vital signs in last 24 hours: Temp:  [98.1 F (36.7 C)-98.7 F (37.1 C)] 98.1 F (36.7 C) (02/22 0429) Pulse Rate:  [84-107] 90 (02/22 0434) Cardiac Rhythm: Normal sinus rhythm;Sinus tachycardia (02/21 2100) Resp:  [14-27] 18 (02/22 0434) BP: (96-126)/(61-80) 98/65 (02/22 0429) SpO2:  [91 %-100 %] 96 % (02/22 0433) Weight:  [83 kg] 83 kg (02/22 0456)  Hemodynamic parameters for last 24 hours:    Intake/Output from previous day: 02/21 0701 - 02/22 0700 In: 5.7 [I.V.:5.7] Out: 950 [Urine:950] Intake/Output this shift: No intake/output data recorded.  General appearance: alert, cooperative, and no distress Neurologic: intact Heart: Irregularly irregular, no murmur Lungs: Diminished bibasilar breath sounds Abdomen: soft, non-tender; bowel sounds normal; no masses,  no organomegaly Extremities: edema trace Wound: Clean and dry leg incision, chest incision with clean and dry dressing in place  Lab Results: Recent Labs    12/20/22 0430 12/21/22 0145  WBC 13.2* 12.4*  HGB 7.7* 7.5*  HCT 24.5* 23.0*  PLT 183 202   BMET:  Recent Labs    12/20/22 0430 12/21/22 0145  NA 132* 134*  K 4.0 3.9  CL 100 104  CO2 26 24  GLUCOSE 113* 114*  BUN 19 21  CREATININE 0.94 0.95  CALCIUM 8.2* 8.3*    PT/INR:  Recent Labs    12/18/22 1342  LABPROT 16.4*  INR 1.3*   ABG    Component Value Date/Time   PHART 7.313 (L) 12/18/2022 2214   HCO3 22.1 12/18/2022 2214   TCO2 23 12/18/2022 2214   ACIDBASEDEF 4.0 (H) 12/18/2022 2214   O2SAT 96 12/18/2022 2214   CBG (last 3)  Recent  Labs    12/21/22 0019 12/21/22 0426 12/21/22 0626  GLUCAP 139* 114* 104*    Assessment/Plan: S/P Procedure(s) (LRB): CORONARY ARTERY BYPASS GRAFTING (CABG) X THREE USING LEFT INTERNAL MAMMARY ARTERY AND RIGHT GREATER SAPHENOUS LEG VEIN HARVESTED ENDOSCOPICALLY. (N/A) TRANSESOPHAGEAL ECHOCARDIOGRAM (N/A)  Neuro: Some chest soreness with coughing. Has only received Tylenol. Continue pain control with Tylenol, Oxycodone PRN and Tramadol PRN.   CV: [name], on Plavix. Has been NSR-ST, HR 90-110. Went into afib with RVR this AM, rate 140-150s. Patient is asymptomatic. SBP 98. Blood pressure restrictive of beta blocker titration. On Lopressor 12.27m BID. Will give Amiodarone bolus.   Pulm: Saturating 97% on 2L [place] O2. CXR with L pleural effusion and vascular congestion. Continue IS and ambulation. Wean oxygen as able.   GI: -BM, good appetite. Passing gas.   Endo: Nondiabetic. CBGs controlled 139/114/104 on SSI. Will d/c SSI.  Renal: UO 950cc/24hrs. Cr 0.95. K 3.9. +8lbs. Start diuresis  Leukocytosis: WBC 12.4 trending down   ABLA: H/H 7.5/23, trending down. Approaching transfusion threshold. Continue iron supplement. Asymptomatic.   Hyponatremia: Na 134 improved  Deconditioning: PT/OT consult  Dispo: Afib with RVR will give Amiodarone bolus.    LOS: 7 days   BMagdalene River PA-C 12/21/2022    Afib this am On amio gtt Titrating BB Diuresing, up 8 lbs Continue PT/OT  Nicholas Dominguez OBary Leriche

## 2022-12-21 NOTE — Evaluation (Addendum)
Physical Therapy Evaluation Patient Details Name: Nicholas Dominguez MRN: [id number] DOB: Apr 03, 1951 Today's Date: 12/21/2022  History of Present Illness  Pt is 72 year old presented to [facility] on  12/14/22 with chest pain/[name]. 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 PMH - small bowel resection.  Clinical Impression  Pt presents to PT with decrease in mobility compared to active baseline. Will benefit from very brief course of PT to work toward decreasing reliance on assistive device with mobility. Should be able to return home with wife without PT follow up.        Recommendations for follow up therapy are one component of a multi-disciplinary discharge planning process, led by the attending physician.  Recommendations may be updated based on patient status, additional functional criteria and insurance authorization.  Follow Up Recommendations No PT follow up      Assistance Recommended at Discharge PRN  Patient can return home with the following  Help with stairs or ramp for entrance;Assist for transportation;Assistance with cooking/housework    Equipment Recommendations Rollator (4 wheels) (likely)  Recommendations for Other Services       Functional Status Assessment Patient has had a recent decline in their functional status and demonstrates the ability to make significant improvements in function in a reasonable and predictable amount of time.     Precautions / Restrictions Precautions Precautions: Sternal Restrictions Weight Bearing Restrictions: Yes      Mobility  Bed Mobility Overal bed mobility: Needs Assistance Bed Mobility: Sidelying to Sit   Sidelying to sit: Min guard       General bed mobility comments: Incr time and effort. Verbal cues for technique    Transfers Overall transfer level: Modified independent Equipment used: None, Rollator (4 wheels)               General transfer comment: Steady rise without use of UE's    Ambulation/Gait Ambulation/Gait  assistance: Supervision, Min guard Gait Distance (Feet): 475 Feet Assistive device: Rollator (4 wheels), None Gait Pattern/deviations: Step-through pattern, Decreased stride length, Drifts right/left Gait velocity: decr Gait velocity interpretation: 1.31 - 2.62 ft/sec, indicative of limited community ambulator   General Gait Details: Steady gait with use of rollator needing only supervision for safety and lines. Short distance without assistive device slightly unsteady with min guard for safety.  Stairs            Wheelchair Mobility    Modified Rankin (Stroke Patients Only)       Balance Overall balance assessment: Mild deficits observed, not formally tested                                           Pertinent Vitals/Pain Pain Assessment Pain Assessment: No/denies pain    Home Living Family/patient expects to be discharged to:: Private residence Living Arrangements: Spouse/significant other Available Help at Discharge: Family Type of Home: House Home Access: Stairs to enter Entrance Stairs-Rails:  (can hold door frame) Entrance Stairs-Number of Steps: 1   Home Layout: One level Home Equipment: Shower seat - built in      Prior Function Prior Level of Function : Independent/Modified Independent;Driving                     Hand Dominance        Extremity/Trunk Assessment   Upper Extremity Assessment Upper Extremity Assessment: Defer to OT evaluation    Lower  Extremity Assessment Lower Extremity Assessment: Generalized weakness       Communication   Communication: HOH  Cognition Arousal/Alertness: Awake/alert Behavior During Therapy: WFL for tasks assessed/performed Overall Cognitive Status: Within Functional Limits for tasks assessed                                          General Comments General comments (skin integrity, edema, etc.): RHR 85. HR with amb 105. SpO2 > 93% on RA with activity     Exercises     Assessment/Plan    PT Assessment Patient needs continued PT services  PT Problem List Decreased strength;Decreased mobility       PT Treatment Interventions DME instruction;Gait training;Stair training;Functional mobility training;Therapeutic activities;Therapeutic exercise;Patient/family education    PT Goals (Current goals can be found in the Care Plan section)  Acute Rehab PT Goals PT Goal Formulation: With patient Time For Goal Achievement: 12/28/22 Potential to Achieve Goals: Good    Frequency Min 3X/week     Co-evaluation               AM-PAC PT "6 Clicks" Mobility  Outcome Measure Help needed turning from your back to your side while in a flat bed without using bedrails?: None Help needed moving from lying on your back to sitting on the side of a flat bed without using bedrails?: A Little Help needed moving to and from a bed to a chair (including a wheelchair)?: A Little Help needed standing up from a chair using your arms (e.g., wheelchair or bedside chair)?: None Help needed to walk in hospital room?: A Little Help needed climbing 3-5 steps with a railing? : A Little 6 Click Score: 20    End of Session   Activity Tolerance: Patient tolerated treatment well Patient left: in chair;with call bell/phone within reach;with family/visitor present Nurse Communication: Mobility status PT Visit Diagnosis: Other abnormalities of gait and mobility (R26.89);Muscle weakness (generalized) (M62.81)    Time: [id number] PT Time Calculation (min) (ACUTE ONLY): 16 min   Charges:   PT Evaluation $PT Eval Low Complexity: [place] [facility] [place] 12/21/2022, 2:17 PM

## 2022-12-21 NOTE — Progress Notes (Signed)
CARDIAC REHAB PHASE I   PRE:  Rate/Rhythm: 87 NSR  BP:  Sitting: 106/64      SaO2: 96 RA  MODE:  Ambulation: 470 ft   AD:  None  POST:  Rate/Rhythm: 111 ST  BP:  Sitting: 121/71      SaO2: 97 RA   Pt amb with standby assistance, pt denies CP and SOB during amb and was returned to room w/o complaint.   Pt walked well, able to stand w/o assistance. Walked the entire hallway w/o complaints.   Nicholas Dominguez  1:24 PM 12/21/2022    Service time is from 1302 to 1326.

## 2022-12-21 NOTE — Research (Signed)
EVOLVE MI Informed Consent   Subject Name: Nicholas Dominguez  Subject met inclusion and exclusion criteria.  The informed consent form, study requirements and expectations were reviewed with the subject and questions and concerns were addressed prior to the signing of the consent form.  The subject verbalized understanding of the trial requirements.  The subject agreed to participate in the EVOLVE MI trial and signed the informed consent at 1206 on 22/Feb/2024.  The informed consent was obtained prior to performance of any protocol-specific procedures for the subject.  A copy of the signed informed consent was given to the subject and a copy was placed in the subject's medical record.   [name]     Protocol # [id number]  Subject Initial ID#:   [id number]                           Age in years: 22  *Demographics are found in the [facility] EMR source.   Protocol Version: v2.00 [id number]  Were all Eligibility Criteria Met?  [x]$  Yes  []$   No  Screening Visit Date:     22/Feb/2024        Protocol # [id number]   Subject ID#    [id number]                         DAY 1 Date:   22/Feb/2024  Randomization:   Geographic Region:    [place]  Randomization Date:   22/Feb/2024  Randomization Time:  1222     Treatment type Assigned   []$  Treatment                                   [x]$   Control

## 2022-12-21 NOTE — Plan of Care (Signed)
  Problem: Clinical Measurements: Goal: Diagnostic test results will improve Outcome: Progressing Goal: Respiratory complications will improve Outcome: Progressing Goal: Cardiovascular complication will be avoided Outcome: Progressing   

## 2022-12-21 NOTE — Plan of Care (Signed)
  Problem: Clinical Measurements: Goal: Will remain free from infection Outcome: Progressing Goal: Respiratory complications will improve Outcome: Progressing Goal: Cardiovascular complication will be avoided Outcome: Progressing   

## 2022-12-22 LAB — BASIC METABOLIC PANEL
Anion gap: 8 (ref 5–15)
BUN: 20 mg/dL (ref 8–23)
CO2: 23 mmol/L (ref 22–32)
Calcium: 8.5 mg/dL — ABNORMAL LOW (ref 8.9–10.3)
Chloride: 105 mmol/L (ref 98–111)
Creatinine, Ser: 1.04 mg/dL (ref 0.61–1.24)
GFR, Estimated: >60 mL/min (ref >60)
Glucose, Bld: 121 mg/dL — ABNORMAL HIGH (ref 70–99)
Potassium: 4 mmol/L (ref 3.5–5.1)
Sodium: 136 mmol/L (ref 135–145)

## 2022-12-22 LAB — CBC
HCT: 24.1 % — ABNORMAL LOW (ref 39.0–52.0)
Hemoglobin: 8.1 g/dL — ABNORMAL LOW (ref 13.0–17.0)
MCH: 32.9 pg (ref 26.0–34.0)
MCHC: 33.6 g/dL (ref 30.0–36.0)
MCV: 98 fL (ref 80.0–100.0)
Platelets: 254 10*3/uL (ref 150–400)
RBC: 2.46 MIL/uL — ABNORMAL LOW (ref 4.22–5.81)
RDW: 13.4 % (ref 11.5–15.5)
WBC: 11.7 10*3/uL — ABNORMAL HIGH (ref 4.0–10.5)
nRBC: 0 % (ref 0.0–0.2)

## 2022-12-22 LAB — GLUCOSE, CAPILLARY
Glucose-Capillary: 95 mg/dL (ref 70–99)
Glucose-Capillary: 100 mg/dL — ABNORMAL HIGH (ref 70–99)
Glucose-Capillary: 110 mg/dL — ABNORMAL HIGH (ref 70–99)

## 2022-12-22 MED ORDER — LACTULOSE 10 GM/15ML PO SOLN
10.0000 g | Freq: Once | ORAL | Status: AC
  Administered 2022-12-22: 10 g via ORAL
  Filled 2022-12-22: qty 15

## 2022-12-22 MED ORDER — AMIODARONE HCL 200 MG PO TABS
400.0000 mg | ORAL_TABLET | Freq: Two times a day (BID) | ORAL | Status: DC
  Administered 2022-12-22 – 2022-12-23 (×3): 400 mg via ORAL
  Filled 2022-12-22 (×3): qty 2

## 2022-12-22 NOTE — Evaluation (Signed)
Occupational Therapy Evaluation Patient Details Name: Nicholas Dominguez MRN: [id number] DOB: [birth year] Today's Date: 12/22/2022   History of Present Illness Pt is 72 year old presented to [facility] on  12/14/22 with chest pain/[name]. 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 PMH - small bowel resection.   Clinical Impression   Pt admitted with the above diagnosis and overall is doing very well with adls and adl transfers despite recent surgery Educated pt on sternal precautions and how they relate to adls. Pt was supervision to mod I with all adls. Feel pt is safe to d/c home from OT standpoint. Pt does not need follow up OT or equipment.      Recommendations for follow up therapy are one component of a multi-disciplinary discharge planning process, led by the attending physician.  Recommendations may be updated based on patient status, additional functional criteria and insurance authorization.   Follow Up Recommendations  No OT follow up     Assistance Recommended at Discharge Set up Supervision/Assistance  Patient can return home with the following Assistance with cooking/housework;Assist for transportation;Help with stairs or ramp for entrance    Functional Status Assessment  Patient has not had a recent decline in their functional status  Equipment Recommendations  None recommended by OT    Recommendations for Other Services       Precautions / Restrictions Precautions Precautions: Sternal Restrictions Weight Bearing Restrictions: Yes      Mobility Bed Mobility               General bed mobility comments: pt in chair on arrival    Transfers Overall transfer level: Modified independent Equipment used: None               General transfer comment: Pt did well with following sternal precautions.      Balance Overall balance assessment: No apparent balance deficits (not formally assessed)                                         ADL either performed or  assessed with clinical judgement   ADL Overall ADL's : Modified independent                                       General ADL Comments: Cues given for sternal precautions. Pt able to incorporate them well into all adls.  No physical assist needed.     Vision Baseline Vision/History: 0 No visual deficits Ability to See in Adequate Light: 0 Adequate Patient Visual Report: No change from baseline Vision Assessment?: No apparent visual deficits     Perception Perception Perception Tested?: No   Praxis Praxis Praxis tested?: Within functional limits    Pertinent Vitals/Pain Pain Assessment Pain Assessment: Faces Faces Pain Scale: Hurts a little bit Pain Location: sternum Pain Descriptors / Indicators: Sore Pain Intervention(s): Monitored during session, Repositioned     Hand Dominance Right   Extremity/Trunk Assessment Upper Extremity Assessment Upper Extremity Assessment: Overall WFL for tasks assessed   Lower Extremity Assessment Lower Extremity Assessment: Defer to PT evaluation   Cervical / Trunk Assessment Cervical / Trunk Assessment: Normal   Communication Communication Communication: HOH   Cognition Arousal/Alertness: Awake/alert Behavior During Therapy: WFL for tasks assessed/performed Overall Cognitive Status: Within Functional Limits for tasks assessed  General Comments  Pt doing well with all adls and functional mobility.    Exercises     Shoulder Instructions      Home Living Family/patient expects to be discharged to:: Private residence Living Arrangements: Spouse/significant other Available Help at Discharge: Family Type of Home: House Home Access: Stairs to enter Technical brewer of Steps: 1   Home Layout: One level     Bathroom Shower/Tub: Occupational psychologist: Handicapped height     [place]: [place] in          Prior  Functioning/Environment Prior Level of Function : Independent/Modified Independent;Driving                        OT Problem List:        OT Treatment/Interventions:      OT Goals(Current goals can be found in the care plan section) Acute Rehab OT Goals Patient Stated Goal: to go home OT Goal Formulation: All assessment and education complete, DC therapy  OT Frequency:      Co-evaluation              AM-PAC OT "6 Clicks" Daily Activity     Outcome Measure Help from another person eating meals?: None Help from another person taking care of personal grooming?: None Help from another person toileting, which includes using toliet, bedpan, or urinal?: None Help from another person bathing (including washing, rinsing, drying)?: None Help from another person to put on and taking off regular upper body clothing?: None Help from another person to put on and taking off regular lower body clothing?: None 6 Click Score: 24   End of Session Nurse Communication: Mobility status  Activity Tolerance: Patient tolerated treatment well Patient left: in chair;with call bell/phone within reach  OT Visit Diagnosis: Unsteadiness on feet (R26.81)                Time: [id number] OT Time Calculation (min): 17 min Charges:  OT General Charges $OT Visit: 1 Visit OT Evaluation $OT Eval Low Complexity: 1 Low  Glenford Peers 12/22/2022, 9:25 AM

## 2022-12-22 NOTE — Progress Notes (Addendum)
      [address]       [place] [address]             [phone]      4 Days Post-Op Procedure(s) (LRB): CORONARY ARTERY BYPASS GRAFTING (CABG) X THREE USING LEFT INTERNAL MAMMARY ARTERY AND RIGHT GREATER SAPHENOUS LEG VEIN HARVESTED ENDOSCOPICALLY. (N/A) TRANSESOPHAGEAL ECHOCARDIOGRAM (N/A) Subjective: Patient states he is doing good  Objective: Vital signs in last 24 hours: Temp:  [97.7 F (36.5 C)-98.6 F (37 C)] 98.6 F (37 C) (02/23 0443) Pulse Rate:  [81-133] 81 (02/22 1721) Cardiac Rhythm: Normal sinus rhythm (02/22 2200) Resp:  [18-20] 19 (02/22 1721) BP: (94-118)/(62-70) 94/64 (02/23 0443) SpO2:  [92 %-98 %] 92 % (02/23 0443) Weight:  [82.9 kg] 82.9 kg (02/23 0443)  Hemodynamic parameters for last 24 hours:    Intake/Output from previous day: 02/22 0701 - 02/23 0700 In: 625 [P.O.:120; I.V.:505] Out: 1600 [Urine:1600] Intake/Output this shift: Total I/O In: 210.3 [I.V.:210.3] Out: 1300 [Urine:1300]  General appearance: alert, cooperative, and no distress Neurologic: intact Heart: NSR-ST, no murmur Lungs: Slightly diminished bibasilar Abdomen: Hypoactive bowel sounds, soft, nontender, no distension Extremities: edema trace Wound: Clean and dry, no erythema or sign of infection. Minimal erythema and swelling surrounding IV site  Lab Results: Recent Labs    12/21/22 0145 12/22/22 0225  WBC 12.4* 11.7*  HGB 7.5* 8.1*  HCT 23.0* 24.1*  PLT 202 254   BMET:  Recent Labs    12/21/22 0145 12/22/22 0225  NA 134* 136  K 3.9 4.0  CL 104 105  CO2 24 23  GLUCOSE 114* 121*  BUN 21 20  CREATININE 0.95 1.04  CALCIUM 8.3* 8.5*    PT/INR: No results for input(s): "LABPROT", "INR" in the last 72 hours. ABG    Component Value Date/Time   PHART 7.313 (L) 12/18/2022 2214   HCO3 22.1 12/18/2022 2214   TCO2 23 12/18/2022 2214   ACIDBASEDEF 4.0 (H) 12/18/2022 2214   O2SAT 96 12/18/2022 2214   CBG (last 3)  Recent Labs     12/21/22 1221 12/21/22 1718 12/21/22 2026  GLUCAP 139* 101* 127*    Assessment/Plan: S/P Procedure(s) (LRB): CORONARY ARTERY BYPASS GRAFTING (CABG) X THREE USING LEFT INTERNAL MAMMARY ARTERY AND RIGHT GREATER SAPHENOUS LEG VEIN HARVESTED ENDOSCOPICALLY. (N/A) TRANSESOPHAGEAL ECHOCARDIOGRAM (N/A)  CV: [name], on Plavix. Afib with RVR yesterday AM, rate 140-150s. Converted to NSR yesterday around noon. NSR-ST this AM, rate 90s-101 On Amiodarone gtt. Will transition to oral Amiodarone. SBP 94-108. On Lopressor 35mmg BID.    Pulm: Saturating 92-98% on RA. CXR with small bilateral pleural effusions and atelectasis. Continue IS and ambulation.    GI: -BM, good appetite. Passing gas. No nausea or vomiting. LOC   Endo: Nondiabetic. CBGs controlled 139/101/127. SSI d/c'd.   Renal: UO 1600cc/24hrs. Cr 1.04. K 4.0. +7lbs. Continue diuresis   Leukocytosis: WBC 11.7 trending down    ABLA: H/H 8.1/24.1, trending up. Continue iron supplement.   Hyponatremia: Resolved Na 136   Extremities: Minimal erythema and swelling around IV site, monitor for phlebitis.    Deconditioning: PT/OT consult. PT recommended no further PT follow up.    Dispo: Transition to PO Amiodarone. Dispo planning.    LOS: 8 days    BMagdalene River PA-C  12/22/2022  Agree with above Doing well [place]

## 2022-12-22 NOTE — Progress Notes (Signed)
CARDIAC REHAB PHASE I   PRE:  Rate/Rhythm: 98 SR  BP:  Sitting: 125/67      SaO2: 96 RA  MODE:  Ambulation: 320 ft   POST:  Rate/Rhythm: 103 ST  BP:  Sitting: 148/81      SaO2:   Pt tolerated exercise well and amb 320 ft independently. Pt denies CP, SOB, or dizziness throughout walk. Pt educated on sternal precautions, exercise guidelines, restrictions, heart healthy nutrition CRP II and IS. Pt referred to AP CRP II.    [id number] Vilinda Blanks, RRT, BSRT 12/22/2022 10:25 AM

## 2022-12-22 NOTE — Progress Notes (Signed)
Physical Therapy Treatment and Discharge Patient Details Name: Nicholas Dominguez MRN: [id number] DOB: 1951-05-09 Today's Date: 12/22/2022   History of Present Illness Pt is 72 year old presented to [facility] on  12/14/22 with chest pain/[name]. 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 PMH - small bowel resection.    PT Comments    Great progress. Ambulating through hallways, VSS, no evidence of LOB while challenged with dynamic balance and gait activities. All questions answered. Declines rollator, feels confident with current abilities. Wife in room and agreeable with POC. No further PT recommendations. Adequate for D/c from mobility standpoint when medically cleared. D/c from PT services as goals are adequately achieved to d/c home at a mod I level.   Recommendations for follow up therapy are one component of a multi-disciplinary discharge planning process, led by the attending physician.  Recommendations may be updated based on patient status, additional functional criteria and insurance authorization.  Follow Up Recommendations  Other (comment) (Cardiac Rehab)     Assistance Recommended at Discharge PRN  Patient can return home with the following Help with stairs or ramp for entrance;Assist for transportation;Assistance with cooking/housework   Equipment Recommendations  None recommended by PT    Recommendations for Other Services       Precautions / Restrictions Precautions Precautions: Sternal Restrictions Weight Bearing Restrictions: Yes     Mobility  Bed Mobility               General bed mobility comments: Sitting EOB.    Transfers Overall transfer level: Modified independent Equipment used: None               General transfer comment: Mantains precautions. Safe from bed and chair, no assist.    Ambulation/Gait Ambulation/Gait assistance: Supervision Gait Distance (Feet): 300 Feet Assistive device: None Gait Pattern/deviations: WFL(Within Functional Limits) Gait  velocity: decr Gait velocity interpretation: 1.31 - 2.62 ft/sec, indicative of limited community ambulator   General Gait Details: Stable without assistive device today. Supervision while perfoming challenging dynamic gait tasks. No loss of balance. HR 101, SpO2 92% with poor waveform available. No physical assist needed.   Stairs             Wheelchair Mobility    Modified Rankin (Stroke Patients Only)       Balance Overall balance assessment: No apparent balance deficits (not formally assessed)                                          Cognition Arousal/Alertness: Awake/alert Behavior During Therapy: WFL for tasks assessed/performed Overall Cognitive Status: Within Functional Limits for tasks assessed                                          Exercises General Exercises - Lower Extremity Ankle Circles/Pumps: AROM, Both, 10 reps, Seated Gluteal Sets: Strengthening, Both, 10 reps, Seated Long Arc Quad: Strengthening, Both, 10 reps, Seated Hip ABduction/ADduction: Strengthening, Both, 10 reps, Seated    General Comments General comments (skin integrity, edema, etc.): Declines rollator, feels confident with current ability. Wife present, supportive.      Pertinent Vitals/Pain Pain Assessment Pain Assessment: Faces Faces Pain Scale: Hurts a little bit Pain Location: sternum Pain Descriptors / Indicators: Sore Pain Intervention(s): Monitored during session    Home Living  Prior Function            PT Goals (current goals can now be found in the care plan section) Acute Rehab PT Goals PT Goal Formulation: With patient/family Time For Goal Achievement: 12/28/22 Potential to Achieve Goals: Good Progress towards PT goals: Goals met/education completed, patient discharged from PT    Frequency    Min 3X/week      PT Plan Current plan remains appropriate    Co-evaluation               AM-PAC PT "6 Clicks" Mobility   Outcome Measure  Help needed turning from your back to your side while in a flat bed without using bedrails?: None Help needed moving from lying on your back to sitting on the side of a flat bed without using bedrails?: None Help needed moving to and from a bed to a chair (including a wheelchair)?: None Help needed standing up from a chair using your arms (e.g., wheelchair or bedside chair)?: None Help needed to walk in hospital room?: A Little Help needed climbing 3-5 steps with a railing? : A Little 6 Click Score: 22    End of Session Equipment Utilized During Treatment: Gait belt Activity Tolerance: Patient tolerated treatment well Patient left: in chair;with call bell/phone within reach;with family/visitor present Nurse Communication: Mobility status PT Visit Diagnosis: Other abnormalities of gait and mobility (R26.89);Muscle weakness (generalized) (M62.81)     Time: [id number] PT Time Calculation (min) (ACUTE ONLY): 10 min  Charges:  $Gait Training: 8-22 mins                     Candie Mile, PT, DPT Physical Therapist Acute Rehabilitation Services [facility]    Ellouise Newer 12/22/2022, 1:13 PM

## 2022-12-22 NOTE — Progress Notes (Signed)
CARDIOLOGY RECOMMENDATIONS:  Discharge is anticipated in the next 48 hours. Recommendations for medications and follow up:  Discharge Medications: Continue medications as they are currently listed in the [facility]. Exceptions to the above: Would add Plavix 75 mg daily for ACS indication  Follow Up: The patient's Primary Cardiologist is Dr Ali Lowe  Follow up in the office in 2 week(s).  Signed,  Chamille Werntz [place], MD  10:49 AM 12/22/2022  [facility]

## 2022-12-22 NOTE — TOC Progression Note (Signed)
Transition of Care (TOC) - Progression Note  Marvetta Gibbons RN, BSN Transitions of Care Unit 4E- RN Case Manager See Treatment Team for direct phone #   Patient Details  Name: Nicholas Dominguez MRN: [id number] Date of Birth: Nov 17, 1950  Transition of Care [facility]) CM/SW Contact  Dahlia Client, Romeo Rabon, RN Phone Number: 12/22/2022, 2:56 PM  Clinical Narrative:    CM spoke with pt and spouse at bedside- wife had questions about medications- reviewed current meds that pt is on- wife is following up for insurance coverage needs. Currently note pt is on meds that are generic.  Also discussed any HH or DME needs- note order for DME- rollator- per wife declines rollator or any other DME at this time.   TOC to continue to follow   Expected Discharge Plan: Home/Self Care Barriers to Discharge: Continued Medical Work up  Expected Discharge Plan and Services In-house Referral: NA Discharge Planning Services: CM Consult Post Acute Care Choice: Durable Medical Equipment Living arrangements for the past 2 months: Single Family Home                 DME Arranged: Walker rolling with seat, Patient refused services DME Agency: NA       HH Arranged: NA [place] Agency: NA         Social Determinants of Health (SDOH) Interventions [place]: No Food Insecurity (09/19/2022)  Housing: Low Risk  (09/19/2022)  Transportation Needs: No Transportation Needs (09/19/2022)  Utilities: Not At Risk (09/19/2022)  Tobacco Use: Medium Risk (12/19/2022)    Readmission Risk Interventions     No data to display

## 2022-12-23 DIAGNOSIS — I2102 ST elevation (STEMI) myocardial infarction involving left anterior descending coronary artery: Secondary | ICD-10-CM

## 2022-12-23 LAB — BASIC METABOLIC PANEL
Anion gap: 9 (ref 5–15)
BUN: 18 mg/dL (ref 8–23)
CO2: 23 mmol/L (ref 22–32)
Calcium: 8.5 mg/dL — ABNORMAL LOW (ref 8.9–10.3)
Chloride: 105 mmol/L (ref 98–111)
Creatinine, Ser: 1.04 mg/dL (ref 0.61–1.24)
GFR, Estimated: >60 mL/min (ref >60)
Glucose, Bld: 109 mg/dL — ABNORMAL HIGH (ref 70–99)
Potassium: 4 mmol/L (ref 3.5–5.1)
Sodium: 137 mmol/L (ref 135–145)

## 2022-12-23 LAB — CBC
HCT: 23.1 % — ABNORMAL LOW (ref 39.0–52.0)
Hemoglobin: 8 g/dL — ABNORMAL LOW (ref 13.0–17.0)
MCH: 33.3 pg (ref 26.0–34.0)
MCHC: 34.6 g/dL (ref 30.0–36.0)
MCV: 96.3 fL (ref 80.0–100.0)
Platelets: 297 10*3/uL (ref 150–400)
RBC: 2.4 MIL/uL — ABNORMAL LOW (ref 4.22–5.81)
RDW: 13.2 % (ref 11.5–15.5)
WBC: 11.4 10*3/uL — ABNORMAL HIGH (ref 4.0–10.5)
nRBC: 0 % (ref 0.0–0.2)

## 2022-12-23 LAB — GLUCOSE, CAPILLARY
Glucose-Capillary: 107 mg/dL — ABNORMAL HIGH (ref 70–99)
Glucose-Capillary: 94 mg/dL (ref 70–99)
Glucose-Capillary: 99 mg/dL (ref 70–99)

## 2022-12-23 MED ORDER — POTASSIUM CHLORIDE CRYS ER 10 MEQ PO TBCR: 10.0000 meq | EXTENDED_RELEASE_TABLET | Freq: Two times a day (BID) | ORAL | 0 refills | Status: DC

## 2022-12-23 MED ORDER — CLOPIDOGREL BISULFATE 75 MG PO TABS: 75.0000 mg | ORAL_TABLET | Freq: Every day | ORAL | 1 refills | Status: DC

## 2022-12-23 MED ORDER — METOPROLOL TARTRATE 25 MG PO TABS: 25.0000 mg | ORAL_TABLET | Freq: Two times a day (BID) | ORAL | 1 refills | Status: DC

## 2022-12-23 MED ORDER — FERROUS SULFATE 325 (65 FE) MG PO TBEC: 325.0000 mg | DELAYED_RELEASE_TABLET | Freq: Every day | ORAL | 0 refills | Status: DC

## 2022-12-23 MED ORDER — AMIODARONE HCL 200 MG PO TABS: ORAL_TABLET | ORAL | 1 refills | Status: DC

## 2022-12-23 MED ORDER — TRAMADOL HCL 50 MG PO TABS: 50.0000 mg | ORAL_TABLET | Freq: Four times a day (QID) | ORAL | 0 refills | Status: DC | PRN

## 2022-12-23 MED ORDER — FUROSEMIDE 20 MG PO TABS: 20.0000 mg | ORAL_TABLET | Freq: Two times a day (BID) | ORAL | 0 refills | Status: DC

## 2022-12-23 MED ORDER — ATORVASTATIN CALCIUM 80 MG PO TABS: 80.0000 mg | ORAL_TABLET | Freq: Every day | ORAL | 1 refills | Status: DC

## 2022-12-23 NOTE — Progress Notes (Signed)
Pt discharged to home with wife.  Pt's PIV removed.  Pt taken off telemetry and CCMD notified. Pt left with all of their personal belongings. AVS documentation reviewed and sent home with Pt and wife and all questions answered.

## 2022-12-23 NOTE — Progress Notes (Signed)
Nurse asked me to look at right forearm as has mild phlebitis from IV Amiodarone. There is some swelling and warmth, minor redness. No need for antibiotic at this time. Patient given instructions if redness or swelling should increase, to contact office.

## 2022-12-23 NOTE — Progress Notes (Addendum)
      [address]       [place] [address]             [phone]        5 Days Post-Op Procedure(s) (LRB): CORONARY ARTERY BYPASS GRAFTING (CABG) X THREE USING LEFT INTERNAL MAMMARY ARTERY AND RIGHT GREATER SAPHENOUS LEG VEIN HARVESTED ENDOSCOPICALLY. (N/A) TRANSESOPHAGEAL ECHOCARDIOGRAM (N/A)  Subjective: Patient sitting in chair, no complaints this am. He would like to go home.  Objective: Vital signs in last 24 hours: Temp:  [98.4 F (36.9 C)-99.5 F (37.5 C)] 98.4 F (36.9 C) (02/24 0242) Pulse Rate:  [76-92] 79 (02/24 0500) Cardiac Rhythm: Normal sinus rhythm (02/23 1930) Resp:  [16-21] 21 (02/24 0500) BP: (110-124)/(55-83) 114/55 (02/24 0242) SpO2:  [87 %-99 %] 87 % (02/24 0500) Weight:  [81.4 kg] 81.4 kg (02/24 0500)  Pre op weight 80.2 kg Current Weight  12/23/22 81.4 kg      Intake/Output from previous day: 02/23 0701 - 02/24 0700 In: 748.3 [P.O.:600; I.V.:148.3] Out: 1100 [Urine:1100]   Physical Exam:  Cardiovascular: RRR Pulmonary: Clear to auscultation bilaterally Abdomen: Soft, non tender, bowel sounds present. Extremities: Mild bilateral lower extremity edema. Wounds: Clean and dry.  No erythema or signs of infection.  Lab Results: CBC: Recent Labs    12/22/22 0225 12/23/22 0133  WBC 11.7* 11.4*  HGB 8.1* 8.0*  HCT 24.1* 23.1*  PLT 254 297   BMET:  Recent Labs    12/22/22 0225 12/23/22 0133  NA 136 137  K 4.0 4.0  CL 105 105  CO2 23 23  GLUCOSE 121* 109*  BUN 20 18  CREATININE 1.04 1.04  CALCIUM 8.5* 8.5*    PT/INR:  Lab Results  Component Value Date   INR 1.3 (H) 12/18/2022   INR 1.3 (H) 12/14/2022   ABG:  INR: Will add last result for INR, ABG once components are confirmed Will add last 4 CBG results once components are confirmed  Assessment/Plan:  1. CV - S/p [name]. Had a fib with RVR. SR this am. On Plavix 75 mg daily, Amiodarone 400 mg bid, Lopressor 25 mg bid 2.  Pulmonary - On room air.  Encourage incentive spirometer. 3. Volume Overload - On Lasix 20 mg bid. 4.  Expected post op acute blood loss anemia - H and H this am stable at 8 and 23.1. Continue Trigels 5. Likely discharge  Donielle M ZimmermanPA-C 8:27 AM   Agree with above Home today  [place]

## 2022-12-23 NOTE — Progress Notes (Signed)
Pt has some mild swelling and redness in right forearm at previous PIV site where  amiodarone was running. Edges marked and warm compress applied.  MD notified.

## 2022-12-23 NOTE — Progress Notes (Signed)
CARDIAC REHAB PHASE I   PRE:  Rate/Rhythm: 84 SR  BP:  Sitting: 115/72      SaO2: 84 SR  MODE:  Ambulation: 470 ft   POST:  Rate/Rhythm: 106 ST  BP:  Sitting: 142/66      SaO2: 97 RA  Pt tolerated exercise well and amb 470 ft independently. Pt denies CP, SOB, or dizziness throughout walk. Discussed IS use. Will continue to follow.  [place], MS, ACSM-CEP 12/23/2022 9:15 AM

## 2022-12-28 ENCOUNTER — Ambulatory Visit (HOSPITAL_COMMUNITY): Payer: Medicare Other

## 2022-12-29 ENCOUNTER — Ambulatory Visit (INDEPENDENT_AMBULATORY_CARE_PROVIDER_SITE_OTHER): Payer: Self-pay | Admitting: Thoracic Surgery (Cardiothoracic Vascular Surgery)

## 2022-12-29 DIAGNOSIS — Z951 Presence of aortocoronary bypass graft: Secondary | ICD-10-CM

## 2022-12-29 NOTE — Progress Notes (Signed)
     [address]       [place] [address]             [phone]       Patient: Home Provider: Office Consent for Telemedicine visit obtained.  Today's visit was completed via a real-time telehealth (see specific modality noted below). The patient/authorized person provided oral consent at the time of the visit to engage in a telemedicine encounter with the present provider at [facility]. The patient/authorized person was informed of the potential benefits, limitations, and risks of telemedicine. The patient/authorized person expressed understanding that the laws that protect confidentiality also apply to telemedicine. The patient/authorized person acknowledged understanding that telemedicine does not provide emergency services and that he or she would need to call 911 or proceed to the nearest hospital for help if such a need arose.   Total time spent in the clinical discussion 10 minutes.  Telehealth Modality: Phone visit (audio only)  I had a telephone visit with  Navraj Sampayo who is s/p CABG.  Overall doing well.   Pain is minimal.  Ambulating well. Vitals have been stable.  Juan Dorsainvil will see [place] back in 1 month with a chest x-ray for cardiac rehab clearance.  Dink Creps Bary Leriche

## 2023-01-03 NOTE — Progress Notes (Addendum)
All   Office Visit    Patient Name: Nicholas Dominguez Date of Encounter: 01/05/2023  Primary Care Provider:  Lanelle Bal, DO Primary Cardiologist:  None Primary Electrophysiologist: None  Chief Complaint    Nicholas Dominguez is a 72 y.o. male with PMH of CAD s/p inferior STEM with CABG X 3  (LIMA LAD, RSVG Diag, OM2) , GERD, tobacco abuse presents today for post bypass follow-up.  Past Medical History    Past Medical History:  Diagnosis Date   GERD (gastroesophageal reflux disease)    Tobacco use    Past Surgical History:  Procedure Laterality Date   BIOPSY  04/06/2022   Procedure: BIOPSY;  Surgeon: Lanelle Bal, DO;  Location: [facility];  Service: Endoscopy;;   BOWEL RESECTION  09/20/2022   Procedure: SMALL BOWEL RESECTION;  Surgeon: Lucretia Roers, MD;  Location: [facility];  Service: General;;   COLONOSCOPY WITH PROPOFOL N/A 04/06/2022   Procedure: COLONOSCOPY WITH PROPOFOL;  Surgeon: Lanelle Bal, DO;  Location: [facility];  Service: Endoscopy;  Laterality: N/A;  11:00am   CORONARY ANGIOGRAPHY N/A 12/14/2022   Procedure: CORONARY ANGIOGRAPHY;  Surgeon: Orbie Pyo, MD;  Location: [facility];  Service: Cardiovascular;  Laterality: N/A;   CORONARY ARTERY BYPASS GRAFT N/A 12/18/2022   Procedure: CORONARY ARTERY BYPASS GRAFTING (CABG) X THREE USING LEFT INTERNAL MAMMARY ARTERY AND RIGHT GREATER SAPHENOUS LEG VEIN HARVESTED ENDOSCOPICALLY.;  Surgeon: Corliss Skains, MD;  Location: [facility] OR;  Service: Open Heart Surgery;  Laterality: N/A;   ESOPHAGOGASTRODUODENOSCOPY (EGD) WITH PROPOFOL N/A 04/06/2022   Procedure: ESOPHAGOGASTRODUODENOSCOPY (EGD) WITH PROPOFOL;  Surgeon: Lanelle Bal, DO;  [facility]: [facility];  Service: Endoscopy;  Laterality: N/A;   GIVENS CAPSULE STUDY N/A 09/04/2022   Procedure: GIVENS CAPSULE STUDY;  Surgeon: Lanelle Bal, DO;  [facility]: [facility];  Service: Endoscopy;  Laterality: N/A;  7:30am   INTRAVASCULAR PRESSURE  WIRE/FFR STUDY N/A 12/14/2022   Procedure: INTRAVASCULAR PRESSURE WIRE/FFR STUDY;  Surgeon: Orbie Pyo, MD;  [facility]: [facility];  Service: Cardiovascular;  Laterality: N/A;   LAPAROTOMY N/A 09/20/2022   Procedure: EXPLORATORY LAPAROTOMY, removal of capsule;  Surgeon: Lucretia Roers, MD;  Location: [facility];  Service: General;  Laterality: N/A;   POLYPECTOMY  04/06/2022   Procedure: POLYPECTOMY;  Surgeon: Lanelle Bal, DO;  [facility];  Service: Endoscopy;;   TEE WITHOUT CARDIOVERSION N/A 12/18/2022   Procedure: TRANSESOPHAGEAL ECHOCARDIOGRAM;  Surgeon: Corliss Skains, MD;  Location: [facility];  Service: Open Heart Surgery;  Laterality: N/A;   TONSILLECTOMY     VASECTOMY      Allergies  No Known Allergies  History of Present Illness    Nicholas Dominguez  is a 72 year old male with the above mention past medical history who presents today for post CABG follow-up.  Nicholas Dominguez presented to [facility] ED with complaint of left-sided chest pain radiating to his jaw.  EKG was completed showing inferior ST depression and high-sensitivity troponins trended from 44-900.  He was transferred to [facility] for further evaluation and left heart catheterization.  LHC completed demonstrating high-grade stenosis in the LAD and second OM with 55% lesion in RCA.  On 2/18 patient developed chest pain while walking with PT and required sublingual nitroglycerin and eventually nitroglycerin infusion.  He was evaluated for bypass and procedure was completed on 12/18/2022 by Dr. Cliffton Asters.  He tolerated bypass procedure well converted and developed atrial  fibrillation which converted to sinus with amiodarone drip and Lopressor.  He was transition to p.o. amiodarone patient was discharged on 12/23/2022 in stable condition.  He was seen by video visit on 12/29/2022 by Dr. Cliffton Asters.  Patient was doing well overall and had minimal pain with stable vitals.  He will require chest x-ray in 1 month prior  to being cleared for cardiac rehab.  Nicholas Dominguez presents today with his wife for post hospital follow-up.  Since last being seen in the office patient reports that he is doing well with no further cardiac complaints at this time.  He is currently experiencing some aching at his incisional site and in his right leg vein harvesting site.  He is compliant with his current medications and denies any adverse reactions. Blood pressure today is well-controlled at 106/52 and heart rate was 62 bpm. He does report a productive cough with clear phlegm and denies any chills or low-grade fevers.  He is walking daily and tolerating activity without any difficulty.  He does note some fatigue that he attributes to recovering from his open heart surgery.  Patient denies chest pain, palpitations, dyspnea, PND, orthopnea, nausea, vomiting, dizziness, syncope, edema, weight gain, or early satiety.  Home Medications    Current Outpatient Medications  Medication Sig Dispense Refill   Acetaminophen (TYLENOL 8 HOUR PO) Take 500 mg by mouth daily at 6 (six) AM. Pt takes 1000 mg 2 tablets once a day.     amiodarone (PACERONE) 200 MG tablet Take 400 mg two times daily for 3 days;then take 200 mg two times daily for one week;then take 200 mg daily thereafter 60 tablet 1   aspirin 81 MG EC tablet Take 81 mg by mouth daily.     atorvastatin (LIPITOR) 80 MG tablet Take 1 tablet (80 mg total) by mouth daily. 30 tablet 1   clopidogrel (PLAVIX) 75 MG tablet Take 1 tablet (75 mg total) by mouth daily. 30 tablet 1   Ferrous Sulfate (IRON) 325 (65 Fe) MG TABS Take 1 tablet (325 mg total) by mouth daily. 30 tablet 0   metoprolol tartrate (LOPRESSOR) 25 MG tablet Take 1 tablet (25 mg total) by mouth 2 (two) times daily. 60 tablet 1   Multiple Vitamin (MULTIVITAMIN) tablet Take 1 tablet by mouth daily.     NIACIN PO Take by mouth. Otc one daily     omeprazole (PRILOSEC) 40 MG capsule Take 40 mg by mouth at bedtime.     No current  facility-administered medications for this visit.     Review of Systems  Please see the history of present illness.    (+) Productive cough, fatigue (+) Chest incisional soreness  All other systems reviewed and are otherwise negative except as noted above.  Physical Exam    Wt Readings from Last 3 Encounters:  01/05/23 182 lb 6.4 oz (82.7 kg)  12/23/22 179 lb 8 oz (81.4 kg)  11/15/22 175 lb (79.4 kg)   VS: Vitals:   01/05/23 0955  BP: (!) 106/52  Pulse: 62  SpO2: 97%  ,Body mass index is 25.44 kg/m.  Constitutional:      Appearance: Healthy appearance. Not in distress.  Neck:     Vascular: JVD normal.  Pulmonary:     Effort: Pulmonary effort is normal.     Breath sounds: Wheezing and diminished left lower lobe with a productive cough no rales.    Cardiovascular:     Normal rate. Regular rhythm. Normal S1. Normal S2.  Murmurs: There is no murmur.  Sternotomy incision clean dry and intact with no evidence of infection noted Edema:    Peripheral edema absent.  Abdominal:     Palpations: Abdomen is soft non tender. There is no hepatomegaly.  Skin:    General: Skin is warm and dry.  Neurological:     General: No focal deficit present.     Mental Status: Alert and oriented to person, place and time.     Cranial Nerves: Cranial nerves are intact.  EKG/LABS/ Recent Cardiac Studies    ECG personally reviewed by me today -none completed today   Lab Results  Component Value Date   WBC 11.4 (H) 12/23/2022   HGB 8.0 (L) 12/23/2022   HCT 23.1 (L) 12/23/2022   MCV 96.3 12/23/2022   PLT 297 12/23/2022   Lab Results  Component Value Date   CREATININE 1.04 12/23/2022   BUN 18 12/23/2022   NA 137 12/23/2022   K 4.0 12/23/2022   CL 105 12/23/2022   CO2 23 12/23/2022   Lab Results  Component Value Date   ALT 9 12/14/2022   AST 31 12/14/2022   ALKPHOS 76 12/14/2022   BILITOT 1.1 12/14/2022   Lab Results  Component Value Date   CHOL 177 12/14/2022   HDL 55  12/14/2022   LDLCALC 115 (H) 12/14/2022   TRIG 36 12/14/2022   CHOLHDL 3.2 12/14/2022    Lab Results  Component Value Date   HGBA1C 5.3 12/14/2022    Cardiac Studies & Procedures   CARDIAC CATHETERIZATION  CARDIAC CATHETERIZATION 12/14/2022  Narrative   1st Diag lesion is 70% stenosed.   Prox LAD lesion is 80% stenosed.   2nd Mrg lesion is 80% stenosed.   Prox RCA to Mid RCA lesion is 55% stenosed.  1.  Multivessel disease consisting of proximal LAD, first diagonal, and second obtuse marginal lesions.  There is moderate disease of the mid right coronary artery which is not significant with an RFR of 0.93.  Recommendation: Given that the patient has TIMI-3 flow in all vessels and is chest pain-free, the patient will be treated medically and referred for surgical revascularization.  Findings Coronary Findings Diagnostic  Dominance: Right  Left Anterior Descending Prox LAD lesion is 80% stenosed.  First Diagonal Branch 1st Diag lesion is 70% stenosed.  Left Circumflex  Second Obtuse Marginal Branch 2nd Mrg lesion is 80% stenosed.  Right Coronary Artery Prox RCA to Mid RCA lesion is 55% stenosed.  Intervention  No interventions have been documented.     ECHOCARDIOGRAM  ECHOCARDIOGRAM COMPLETE 12/15/2022  Narrative ECHOCARDIOGRAM REPORT    Patient Name:   [name] Date of Exam: 12/15/2022 Medical Rec #:  [id number]     Height:       71.0 in Accession #:    [id number]    Weight:       176.9 lb Date of Birth:  06-01-1951     BSA:          2.001 m Patient Age:    71 years      BP:           119/71 mmHg Patient Gender: M             HR:           72 bpm. Exam Location:  Inpatient  Procedure: 2D Echo, Cardiac Doppler and Color Doppler  Indications:    CAD of native vessel  History:  Patient has no prior history of Echocardiogram examinations.  Sonographer:    Mike Gip Referring Phys: Eliezer Lofts LIGHTFOOT  IMPRESSIONS   1. Left ventricular  ejection fraction, by estimation, is 50 to 55%. The left ventricle has low normal function. The left ventricle has no regional wall motion abnormalities. There is moderate left ventricular hypertrophy. Left ventricular diastolic parameters are consistent with Grade I diastolic dysfunction (impaired relaxation). 2. Right ventricular systolic function is normal. The right ventricular size is normal. There is normal pulmonary artery systolic pressure. The estimated right ventricular systolic pressure is 25.6 mmHg. 3. The mitral valve is normal in structure. Trivial mitral valve regurgitation. No evidence of mitral stenosis. 4. The aortic valve is normal in structure. Aortic valve regurgitation is not visualized. No aortic stenosis is present. 5. The inferior vena cava is dilated in size with >50% respiratory variability, suggesting right atrial pressure of 8 mmHg.  FINDINGS Left Ventricle: Left ventricular ejection fraction, by estimation, is 50 to 55%. The left ventricle has low normal function. The left ventricle has no regional wall motion abnormalities. The left ventricular internal cavity size was normal in size. There is moderate left ventricular hypertrophy. Left ventricular diastolic parameters are consistent with Grade I diastolic dysfunction (impaired relaxation).   LV Wall Scoring: The mid and distal anterior septum is akinetic.  Right Ventricle: The right ventricular size is normal. No increase in right ventricular wall thickness. Right ventricular systolic function is normal. There is normal pulmonary artery systolic pressure. The tricuspid regurgitant velocity is 2.10 m/s, and with an assumed right atrial pressure of 8 mmHg, the estimated right ventricular systolic pressure is 25.6 mmHg.  Left Atrium: Left atrial size was normal in size.  Right Atrium: Right atrial size was normal in size.  Pericardium: There is no evidence of pericardial effusion.  Mitral Valve: The mitral valve  is normal in structure. Trivial mitral valve regurgitation. No evidence of mitral valve stenosis.  Tricuspid Valve: The tricuspid valve is normal in structure. Tricuspid valve regurgitation is trivial. No evidence of tricuspid stenosis.  Aortic Valve: The aortic valve is normal in structure. Aortic valve regurgitation is not visualized. No aortic stenosis is present.  Pulmonic Valve: The pulmonic valve was normal in structure. Pulmonic valve regurgitation is not visualized. No evidence of pulmonic stenosis.  Aorta: The aortic root is normal in size and structure.  Venous: The inferior vena cava is dilated in size with greater than 50% respiratory variability, suggesting right atrial pressure of 8 mmHg.  IAS/Shunts: No atrial level shunt detected by color flow Doppler.   LEFT VENTRICLE PLAX 2D LVIDd:         4.40 cm      Diastology LVIDs:         3.40 cm      LV e' medial:    7.72 cm/s LV PW:         1.40 cm      LV E/e' medial:  7.3 LV IVS:        1.30 cm      LV e' lateral:   11.40 cm/s LVOT diam:     2.20 cm      LV E/e' lateral: 5.0 LV SV:         71 LV SV Index:   35 LVOT Area:     3.80 cm  LV Volumes (MOD) LV vol d, MOD A2C: 142.0 ml LV vol d, MOD A4C: 139.0 ml LV vol s, MOD A2C: 65.3 ml LV  vol s, MOD A4C: 61.8 ml LV SV MOD A2C:     76.7 ml LV SV MOD A4C:     139.0 ml LV SV MOD BP:      79.2 ml  RIGHT VENTRICLE             IVC RV Basal diam:  3.80 cm     IVC diam: 2.60 cm RV S prime:     19.80 cm/s TAPSE (M-mode): 3.0 cm  LEFT ATRIUM             Index        RIGHT ATRIUM           Index LA diam:        3.40 cm 1.70 cm/m   RA Area:     15.00 cm LA Vol (A2C):   67.6 ml 33.78 ml/m  RA Volume:   37.20 ml  18.59 ml/m LA Vol (A4C):   57.2 ml 28.58 ml/m LA Biplane Vol: 62.2 ml 31.08 ml/m AORTIC VALVE LVOT Vmax:   88.60 cm/s LVOT Vmean:  60.500 cm/s LVOT VTI:    0.186 m  AORTA Ao Root diam: 3.70 cm Ao Asc diam:  3.60 cm  MITRAL VALVE               TRICUSPID  VALVE MV Area (PHT): 2.38 cm    TV Peak grad:   17.6 mmHg MV Decel Time: 319 msec    TV Vmax:        2.10 m/s MV E velocity: 56.60 cm/s  TR Peak grad:   17.6 mmHg MV A velocity: 57.10 cm/s  TR Vmax:        210.00 cm/s MV E/A ratio:  0.99 SHUNTS Systemic VTI:  0.19 m Systemic Diam: 2.20 cm  Donato Schultz MD Electronically signed by Donato Schultz MD Signature Date/Time: 12/15/2022/11:43:28 AM    Final   TEE  ECHO INTRAOPERATIVE TEE 12/20/2022  Narrative *INTRAOPERATIVE TRANSESOPHAGEAL REPORT *    Patient Name:   GUILHERME CHAPPEL Date of Exam: 12/18/2022 Medical Rec #:  [id number]     Height:       71.0 in Accession #:    [id number]    Weight:       176.8 lb Date of Birth:  Jan 28, 1951     BSA:          2.00 m Patient Age:    71 years      BP:           114/71 mmHg Patient Gender: M             HR:           70 bpm. Exam Location:  Inpatient  Transesophogeal exam was perform intraoperatively during surgical procedure. Patient was closely monitored under general anesthesia during the entirety of examination.  Indications:     coronary artery bypass surgery Sonographer:     Delcie Roch RDCS Performing Phys: [id number] Eliezer Lofts LIGHTFOOT Diagnosing Phys: Marcene Duos MD  Complications: No known complications during this procedure. POST-OP IMPRESSIONS _ Left Ventricle: The left ventricle is unchanged from pre-bypass. _ Right Ventricle: The right ventricle appears unchanged from pre-bypass. _ Aorta: The aorta appears unchanged from pre-bypass. _ Left Atrium: The left atrium appears unchanged from pre-bypass. _ Left Atrial Appendage: The left atrial appendage appears unchanged from pre-bypass. _ Aortic Valve: The aortic valve appears unchanged from pre-bypass. _ Mitral Valve: The mitral valve appears unchanged from pre-bypass. _ Tricuspid Valve: The tricuspid  valve appears unchanged from pre-bypass. _ Pulmonic Valve: The pulmonic valve appears unchanged from pre-bypass. _  Interatrial Septum: The interatrial septum appears unchanged from pre-bypass. _ Interventricular Septum: The interventricular septum appears unchanged from pre-bypass. _ Pericardium: The pericardium appears unchanged from pre-bypass.  PRE-OP FINDINGS Left Ventricle: The left ventricle has low normal systolic function, with an ejection fraction of 50-55%. The cavity size was normal. There is mild concentric left ventricular hypertrophy.   Right Ventricle: The right ventricle has normal systolic function. The cavity was normal. There is no increase in right ventricular wall thickness.  Left Atrium: Left atrial size was normal in size. No left atrial/left atrial appendage thrombus was detected.  Right Atrium: Right atrial size was normal in size.  Interatrial Septum: No atrial level shunt detected by color flow Doppler.  Pericardium: There is no evidence of pericardial effusion.  Mitral Valve: The mitral valve is normal in structure. Mitral valve regurgitation is not visualized by color flow Doppler. There is No evidence of mitral stenosis.  Tricuspid Valve: The tricuspid valve was normal in structure. Tricuspid valve regurgitation was not visualized by color flow Doppler.  Aortic Valve: The aortic valve is tricuspid Aortic valve regurgitation was not visualized by color flow Doppler. There is no stenosis of the aortic valve.   Pulmonic Valve: The pulmonic valve was normal in structure. Pulmonic valve regurgitation is not visualized by color flow Doppler.   Aorta: The aortic root, ascending aorta and aortic arch are normal in size and structure.  +--------------+--------++ LEFT VENTRICLE         +--------------+--------++ PLAX 2D                +--------------+--------++ LVIDd:        5.00 cm  +--------------+--------++ LVIDs:        3.70 cm  +--------------+--------++ LVOT diam:    2.40 cm  +--------------+--------++ LV SV:        60 ml     +--------------+--------++ LV SV Index:  29.90    +--------------+--------++ LVOT Area:    4.52 cm +--------------+--------++                        +--------------+--------++   +-------------+-------++ AORTA                +-------------+-------++ Ao Root diam:3.60 cm +-------------+-------++ Ao Asc diam: 3.10 cm +-------------+-------++   +--------------+-------+ SHUNTS                +--------------+-------+ Systemic Diam:2.40 cm +--------------+-------+   Marcene Duos MD Electronically signed by Marcene Duos MD Signature Date/Time: 12/20/2022/7:32:22 PM    Final            Assessment & Plan    1.  Coronary artery disease: -s/p CABG x 3 on 12/18/2022 -Presents today for initial follow-up and reports that he is doing well denies any chest pain or tightness. -His chest incision is clean and dry and shows no evidence of infection. -He does endorse a productive cough with white phlegm. -Patient had diminished breath sounds on the left lower lobe on auscultation. -We will obtain a chest x-ray for further investigation. -He is fine to proceed with cardiac rehab pending thoracic surgery follow-up.  2.  Postop atrial fibrillation: -Patient currently on amiodarone 200 mg daily -Patient reports no recurrence since his discharge. -We will continue surveillance labs today  3.  Hyperlipidemia: -Patient's last LDL cholesterol was 115 -Continue atorvastatin 80 mg daily  4.  GERD: -  Well-controlled currently with no new complaints.    Cardiac Rehabilitation Eligibility Assessment  The patient is ready to start cardiac rehabilitation pending clearance from the cardiac surgeon.     Disposition: Follow-up with None or APP in 2 months    Medication Adjustments/Labs and Tests Ordered: Current medicines are reviewed at length with the patient today.  Concerns regarding medicines are outlined above.   Signed, Napoleon Form, Leodis Rains, NP 01/05/2023, 12:29 PM [facility]  Note:  This document was prepared using Dragon voice recognition software and may include unintentional dictation errors.

## 2023-01-05 ENCOUNTER — Ambulatory Visit (HOSPITAL_COMMUNITY)
Admission: RE | Admit: 2023-01-05 | Discharge: 2023-01-05 | Disposition: A | Discharge status: Home / Self Care | Payer: Medicare Other | Source: Ambulatory Visit | Attending: Nurse Practitioner | Admitting: Nurse Practitioner

## 2023-01-05 ENCOUNTER — Encounter: Payer: Self-pay | Admitting: Nurse Practitioner

## 2023-01-05 ENCOUNTER — Ambulatory Visit (INDEPENDENT_AMBULATORY_CARE_PROVIDER_SITE_OTHER): Payer: Medicare Other | Admitting: Nurse Practitioner

## 2023-01-05 VITALS — BP 106/52 | HR 62 | Ht 71.0 in | Wt 182.4 lb

## 2023-01-05 DIAGNOSIS — R058 Other specified cough: Secondary | ICD-10-CM | POA: Diagnosis present

## 2023-01-05 DIAGNOSIS — I9789 Other postprocedural complications and disorders of the circulatory system, not elsewhere classified: Secondary | ICD-10-CM | POA: Insufficient documentation

## 2023-01-05 DIAGNOSIS — E785 Hyperlipidemia, unspecified: Secondary | ICD-10-CM | POA: Insufficient documentation

## 2023-01-05 DIAGNOSIS — Z951 Presence of aortocoronary bypass graft: Secondary | ICD-10-CM | POA: Insufficient documentation

## 2023-01-05 DIAGNOSIS — K219 Gastro-esophageal reflux disease without esophagitis: Secondary | ICD-10-CM | POA: Insufficient documentation

## 2023-01-05 DIAGNOSIS — I4891 Unspecified atrial fibrillation: Secondary | ICD-10-CM | POA: Insufficient documentation

## 2023-01-05 MED ORDER — IRON 325 (65 FE) MG PO TABS: 1.0000 | ORAL_TABLET | Freq: Every day | ORAL | 0 refills | Status: AC

## 2023-01-05 NOTE — Patient Instructions (Signed)
Medication Instructions:  START Slow Fe- '325mg'$  Take 1 tablet once day *If you need a refill on your cardiac medications before your next appointment, please call your pharmacy*   Lab Work: TODAY-CMET, CBC & TSH  If you have labs (blood work) drawn today and your tests are completely normal, you will receive your results only by: [facility] (if you have MyChart) OR A paper copy in the mail If you have any lab test that is abnormal or we need to change your treatment, we will call you to review the results.   Testing/Procedures: A chest x-ray takes a picture of the organs and structures inside the chest, including the heart, lungs, and blood vessels. This test can show several things, including, whether the heart is enlarges; whether fluid is building up in the lungs; and whether pacemaker / defibrillator leads are still in place.    Follow-Up: At [facility], you and your health needs are our priority.  As part of our continuing mission to provide you with exceptional heart care, we have created designated Provider Care Teams.  These Care Teams include your primary Cardiologist (physician) and Advanced Practice Providers (APPs -  Physician Assistants and Nurse Practitioners) who all work together to provide you with the care you need, when you need it.  We recommend signing up for the patient portal called "MyChart".  Sign up information is provided on this After Visit Summary.  MyChart is used to connect with patients for Virtual Visits (Telemedicine).  Patients are able to view lab/test results, encounter notes, upcoming appointments, etc.  Non-urgent messages can be sent to your provider as well.   To learn more about what you can do with MyChart, go to [email or web address].    Your next appointment:   2 months   Provider:   Claudina Lick, MD, Bernerd Pho, PA-C, or Ermalinda Barrios, PA-C    Other Instructions

## 2023-01-06 LAB — CBC
Hematocrit: 31.3 % — ABNORMAL LOW (ref 37.5–51.0)
Hemoglobin: 10.6 g/dL — ABNORMAL LOW (ref 13.0–17.7)
MCH: 33.2 pg — ABNORMAL HIGH (ref 26.6–33.0)
MCHC: 33.9 g/dL (ref 31.5–35.7)
MCV: 98 fL — ABNORMAL HIGH (ref 79–97)
Platelets: 697 10*3/uL — ABNORMAL HIGH (ref 150–450)
RBC: 3.19 x10E6/uL — ABNORMAL LOW (ref 4.14–5.80)
RDW: 12.3 % (ref 11.6–15.4)
WBC: 13.3 10*3/uL — ABNORMAL HIGH (ref 3.4–10.8)

## 2023-01-06 LAB — COMPREHENSIVE METABOLIC PANEL
ALT: 5 IU/L (ref 0–44)
AST: 13 IU/L (ref 0–40)
Albumin/Globulin Ratio: 1.7 (ref 1.2–2.2)
Albumin: 4.3 g/dL (ref 3.8–4.8)
Alkaline Phosphatase: 155 IU/L — ABNORMAL HIGH (ref 44–121)
BUN/Creatinine Ratio: 13 (ref 10–24)
BUN: 16 mg/dL (ref 8–27)
Bilirubin Total: 0.5 mg/dL (ref 0.0–1.2)
CO2: 20 mmol/L (ref 20–29)
Calcium: 9.8 mg/dL (ref 8.6–10.2)
Chloride: 105 mmol/L (ref 96–106)
Creatinine, Ser: 1.22 mg/dL (ref 0.76–1.27)
Globulin, Total: 2.5 g/dL (ref 1.5–4.5)
Glucose: 94 mg/dL (ref 70–99)
Potassium: 4.8 mmol/L (ref 3.5–5.2)
Sodium: 141 mmol/L (ref 134–144)
Total Protein: 6.8 g/dL (ref 6.0–8.5)
eGFR: 63 mL/min/{1.73_m2} (ref >59)

## 2023-01-06 LAB — TSH: TSH: 3.21 u[IU]/mL (ref 0.450–4.500)

## 2023-01-09 ENCOUNTER — Other Ambulatory Visit: Payer: Self-pay

## 2023-01-09 DIAGNOSIS — R058 Other specified cough: Secondary | ICD-10-CM

## 2023-01-09 DIAGNOSIS — Z006 Encounter for examination for normal comparison and control in clinical research program: Secondary | ICD-10-CM

## 2023-01-09 MED ORDER — FUROSEMIDE 20 MG PO TABS: 20.0000 mg | ORAL_TABLET | Freq: Every day | ORAL | 0 refills | Status: DC

## 2023-01-19 ENCOUNTER — Ambulatory Visit (HOSPITAL_COMMUNITY)
Admission: RE | Admit: 2023-01-19 | Discharge: 2023-01-19 | Disposition: A | Discharge status: Home / Self Care | Payer: Medicare Other | Source: Ambulatory Visit | Attending: Nurse Practitioner | Admitting: Nurse Practitioner

## 2023-01-19 ENCOUNTER — Other Ambulatory Visit: Payer: Self-pay

## 2023-01-19 DIAGNOSIS — R058 Other specified cough: Secondary | ICD-10-CM | POA: Insufficient documentation

## 2023-01-19 MED ORDER — ATORVASTATIN CALCIUM 80 MG PO TABS: 80.0000 mg | ORAL_TABLET | Freq: Every day | ORAL | 3 refills | Status: DC

## 2023-01-19 MED ORDER — METOPROLOL TARTRATE 25 MG PO TABS: 25.0000 mg | ORAL_TABLET | Freq: Two times a day (BID) | ORAL | 3 refills | Status: DC

## 2023-01-19 MED ORDER — CLOPIDOGREL BISULFATE 75 MG PO TABS: 75.0000 mg | ORAL_TABLET | Freq: Every day | ORAL | 3 refills | Status: DC

## 2023-01-19 NOTE — Telephone Encounter (Signed)
Pt's medications were sent to pt's pharmacy as requested. Confirmation received.  

## 2023-01-26 ENCOUNTER — Ambulatory Visit
Admission: RE | Admit: 2023-01-26 | Discharge: 2023-01-26 | Disposition: A | Discharge status: Home / Self Care | Payer: Medicare Other | Source: Ambulatory Visit | Attending: Internal Medicine | Admitting: Internal Medicine

## 2023-01-26 ENCOUNTER — Ambulatory Visit
Admission: RE | Admit: 2023-01-26 | Discharge: 2023-01-26 | Disposition: A | Discharge status: Home / Self Care | Payer: Medicare Other | Source: Ambulatory Visit | Attending: General Surgery | Admitting: General Surgery

## 2023-01-26 DIAGNOSIS — R911 Solitary pulmonary nodule: Secondary | ICD-10-CM

## 2023-01-26 DIAGNOSIS — R599 Enlarged lymph nodes, unspecified: Secondary | ICD-10-CM

## 2023-01-26 MED ORDER — IOPAMIDOL (ISOVUE-370) INJECTION 76%
80.0000 mL | Freq: Once | INTRAVENOUS | Status: AC | PRN
  Administered 2023-01-26: 80 mL via INTRAVENOUS

## 2023-01-31 ENCOUNTER — Other Ambulatory Visit: Payer: Self-pay | Admitting: Thoracic Surgery (Cardiothoracic Vascular Surgery)

## 2023-01-31 DIAGNOSIS — Z951 Presence of aortocoronary bypass graft: Secondary | ICD-10-CM

## 2023-01-31 NOTE — Progress Notes (Unsigned)
      [address]       [place] [address]             [phone]       HPI: Nicholas Dominguez is a 72 year old male with past history of PE, gastroesophageal reflux disease and you have had 3 for a small bowel obstruction about 4 months ago.  He was transferred to [facility] back in March after presenting to [facility] with an acute ST elevation myocardial infarction.  Left heart catheterization demonstrated severe multivessel coronary artery disease.  He was evaluated by Dr. Kipp Brood and ultimately underwent CABG x 3 on 12/18/2022.  Postoperative course was notable for atrial fibrillation managed with amiodarone with successful conversion back to sinus rhythm before discharge.  He was discharged in stable condition to home on postop day 5.  He had telephone follow-up with Dr. Kipp Brood about 1 month ago and was doing well at that time.  He has seen cardiology in follow-up as well.    Current Outpatient Medications  Medication Sig Dispense Refill   furosemide (LASIX) 20 MG tablet Take 1 tablet (20 mg total) by mouth daily. 7 tablet 0   Acetaminophen (TYLENOL 8 HOUR PO) Take 500 mg by mouth daily at 6 (six) AM. Pt takes 1000 mg 2 tablets once a day.     amiodarone (PACERONE) 200 MG tablet Take 400 mg two times daily for 3 days;then take 200 mg two times daily for one week;then take 200 mg daily thereafter 60 tablet 1   aspirin 81 MG EC tablet Take 81 mg by mouth daily.     atorvastatin (LIPITOR) 80 MG tablet Take 1 tablet (80 mg total) by mouth daily. 90 tablet 3   clopidogrel (PLAVIX) 75 MG tablet Take 1 tablet (75 mg total) by mouth daily. 90 tablet 3   Ferrous Sulfate (IRON) 325 (65 Fe) MG TABS Take 1 tablet (325 mg total) by mouth daily. 30 tablet 0   metoprolol tartrate (LOPRESSOR) 25 MG tablet Take 1 tablet (25 mg total) by mouth 2 (two) times daily. 180 tablet 3   Multiple Vitamin (MULTIVITAMIN) tablet Take 1 tablet by mouth daily.     NIACIN PO Take by  mouth. Otc one daily     omeprazole (PRILOSEC) 40 MG capsule Take 40 mg by mouth at bedtime.     No current facility-administered medications for this visit.    Physical Exam: ***  Diagnostic Tests: ***  Impression: ***  Plan: ***   Antony Odea, PA-C [facility] [phone]

## 2023-02-01 ENCOUNTER — Encounter: Payer: Self-pay | Admitting: Physician Assistant

## 2023-02-01 ENCOUNTER — Ambulatory Visit (INDEPENDENT_AMBULATORY_CARE_PROVIDER_SITE_OTHER): Payer: Self-pay | Admitting: Physician Assistant

## 2023-02-01 ENCOUNTER — Ambulatory Visit
Admission: RE | Admit: 2023-02-01 | Discharge: 2023-02-01 | Disposition: A | Discharge status: Home / Self Care | Payer: Medicare Other | Source: Ambulatory Visit | Attending: Thoracic Surgery (Cardiothoracic Vascular Surgery) | Admitting: Thoracic Surgery (Cardiothoracic Vascular Surgery)

## 2023-02-01 ENCOUNTER — Other Ambulatory Visit: Payer: Self-pay | Admitting: Thoracic Surgery (Cardiothoracic Vascular Surgery)

## 2023-02-01 VITALS — BP 135/78 | HR 61 | Resp 20 | Ht 71.0 in | Wt 186.0 lb

## 2023-02-01 DIAGNOSIS — Z951 Presence of aortocoronary bypass graft: Secondary | ICD-10-CM

## 2023-02-01 NOTE — Patient Instructions (Signed)
Continue to observe sternal precautions with no lifting more than 15 pounds for another month.  After that, you may gradually increase activity without limitation.  Continue the current amiodarone prescription until finished and then stop the amiodarone.Marland Kitchen  Resume driving.  Will make referral for cardiac rehab in [place]  We will arrange for left thoracentesis with ultrasound guidance by the interventional radiology service at the hospital.

## 2023-02-02 ENCOUNTER — Other Ambulatory Visit: Payer: Self-pay | Admitting: *Deleted

## 2023-02-05 NOTE — Progress Notes (Signed)
Lymph node appears smaller which is reassuring.  The CT recommends a repeat in 6 months which I think is reasonable since it has not resolved. Can you have him follow up with me September so we can order CT in September.

## 2023-02-07 ENCOUNTER — Ambulatory Visit (HOSPITAL_COMMUNITY)
Admission: RE | Admit: 2023-02-07 | Discharge: 2023-02-07 | Disposition: A | Discharge status: Home / Self Care | Payer: Medicare Other | Source: Ambulatory Visit | Attending: Thoracic Surgery (Cardiothoracic Vascular Surgery) | Admitting: Thoracic Surgery (Cardiothoracic Vascular Surgery)

## 2023-02-07 ENCOUNTER — Other Ambulatory Visit: Payer: Self-pay | Admitting: Thoracic Surgery (Cardiothoracic Vascular Surgery)

## 2023-02-07 DIAGNOSIS — J90 Pleural effusion, not elsewhere classified: Secondary | ICD-10-CM | POA: Diagnosis present

## 2023-02-07 DIAGNOSIS — Z951 Presence of aortocoronary bypass graft: Secondary | ICD-10-CM

## 2023-02-07 HISTORY — PX: IR THORACENTESIS ASP PLEURAL SPACE W/IMG GUIDE: IMG5380

## 2023-02-07 MED ORDER — LIDOCAINE HCL 1 % IJ SOLN
INTRAMUSCULAR | Status: AC
  Filled 2023-02-07: qty 20

## 2023-02-07 MED ORDER — LIDOCAINE HCL (PF) 1 % IJ SOLN: 10.0000 mL | Freq: Once | INTRAMUSCULAR | Status: DC

## 2023-02-07 NOTE — Progress Notes (Unsigned)
Referring Provider: Lanelle Balarver, Charles K, DO Primary Care Physician:  Lanelle Balarver, Charles K, DO Primary GI Physician: Dr. Marletta Lorarver  No chief complaint on file.   HPI:   Nicholas Dominguez is a 72 y.o. male presenting today for routine follow-up.  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  Last seen in our office 10/11/2022.  GERD well-controlled on omeprazole 40 mg daily..  Noted some mild constipation that he was controlling with over-the-counter stool softeners.  Dr. Marletta Lorarver discussed small bowel stricture with lymphadenopathy with Dr. Henreitta LeberBridges who recommended repeating CT imaging in about 8 weeks postop to reevaluate lymphadenopathy.  Otherwise, recommended continuing current medications and follow-up in 4 months.   CT A/P with contrast 01/26/2023 with small bowel mesenteric adenopathy felt to be slightly decreased.  Stated this was most likely reactive but remain indeterminate with recommendations to follow-up CT in 6 months.  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  Stated this can be reevaluated with pre and postcontrast CT at 6 months versus MRI.  Dr. Henreitta LeberBridges plans to follow-up with patient in September to reorder CT.  Today:    Past Medical History:  Diagnosis Date   GERD (gastroesophageal reflux disease)    Tobacco use     Past Surgical History:  Procedure Laterality Date   BIOPSY  04/06/2022   Procedure: BIOPSY;  Surgeon: Lanelle Balarver, Charles K, DO;  Location: [facility];  Service: Endoscopy;;   BOWEL RESECTION  09/20/2022   Procedure: SMALL BOWEL RESECTION;  Surgeon: Lucretia RoersBridges, Lindsay C, MD;  Location: [facility];  Service: General;;   COLONOSCOPY WITH PROPOFOL N/A 04/06/2022   Procedure: COLONOSCOPY WITH PROPOFOL;   Surgeon: Lanelle Balarver, Charles K, DO;  Location: [facility];  Service: Endoscopy;  Laterality: N/A;  11:00am   CORONARY ANGIOGRAPHY N/A 12/14/2022   Procedure: CORONARY ANGIOGRAPHY;  Surgeon: Orbie Pyohukkani, Arun K, MD;  Location: [facility];  Service: Cardiovascular;  Laterality: N/A;   CORONARY ARTERY BYPASS GRAFT N/A 12/18/2022   Procedure: CORONARY ARTERY BYPASS GRAFTING (CABG) X THREE USING LEFT INTERNAL MAMMARY ARTERY AND RIGHT GREATER SAPHENOUS LEG VEIN HARVESTED ENDOSCOPICALLY.;  Surgeon: Corliss SkainsLightfoot, Harrell O, MD;  Location: [facility];  Service: Open Heart Surgery;  Laterality: N/A;   CORONARY PRESSURE/FFR STUDY N/A 12/14/2022   Procedure: INTRAVASCULAR PRESSURE WIRE/FFR STUDY;  Surgeon: Orbie Pyohukkani, Arun K, MD;  Location: [facility];  Service: Cardiovascular;  Laterality: N/A;   ESOPHAGOGASTRODUODENOSCOPY (EGD) WITH PROPOFOL N/A 04/06/2022   Procedure: ESOPHAGOGASTRODUODENOSCOPY (EGD) WITH PROPOFOL;  Surgeon: Lanelle Balarver, Charles K, DO;  [facility]: [facility];  Service: Endoscopy;  Laterality: N/A;   GIVENS CAPSULE STUDY N/A 09/04/2022   Procedure: GIVENS CAPSULE STUDY;  Surgeon: Lanelle Balarver, Charles K, DO;  [facility]: [facility];  Service: Endoscopy;  Laterality: N/A;  7:30am   IR THORACENTESIS ASP PLEURAL SPACE W/IMG GUIDE  02/07/2023   LAPAROTOMY N/A 09/20/2022   Procedure: EXPLORATORY LAPAROTOMY, removal of capsule;  Surgeon: Lucretia RoersBridges, Lindsay C, MD;  Location: [facility];  Service: General;  Laterality: N/A;   POLYPECTOMY  04/06/2022   Procedure: POLYPECTOMY;  Surgeon: Lanelle Balarver, Charles K, DO;  [facility]: [facility];  Service: Endoscopy;;   TEE WITHOUT CARDIOVERSION N/A 12/18/2022   Procedure: TRANSESOPHAGEAL ECHOCARDIOGRAM;  Surgeon: Brynda GreathouseLightfoot, Harrell  O, MD;  Location: [facility];  Service: Open Heart Surgery;  Laterality: N/A;   TONSILLECTOMY     VASECTOMY      Current Outpatient Medications  Medication Sig Dispense Refill   Acetaminophen (TYLENOL 8 HOUR PO) Take 500 mg by mouth daily at 6 (six) AM.  Pt takes 1000 mg 2 tablets once a day. (Patient not taking: Reported on 02/01/2023)     amiodarone (PACERONE) 200 MG tablet Take 400 mg two times daily for 3 days;then take 200 mg two times daily for one week;then take 200 mg daily thereafter 60 tablet 1   aspirin 81 MG EC tablet Take 81 mg by mouth daily.     atorvastatin (LIPITOR) 80 MG tablet Take 1 tablet (80 mg total) by mouth daily. 90 tablet 3   clopidogrel (PLAVIX) 75 MG tablet Take 1 tablet (75 mg total) by mouth daily. 90 tablet 3   Ferrous Sulfate (IRON) 325 (65 Fe) MG TABS Take 1 tablet (325 mg total) by mouth daily. 30 tablet 0   furosemide (LASIX) 20 MG tablet Take 1 tablet (20 mg total) by mouth daily. 7 tablet 0   metoprolol tartrate (LOPRESSOR) 25 MG tablet Take 1 tablet (25 mg total) by mouth 2 (two) times daily. 180 tablet 3   Multiple Vitamin (MULTIVITAMIN) tablet Take 1 tablet by mouth daily.     NIACIN PO Take by mouth. Otc one daily     omeprazole (PRILOSEC) 40 MG capsule Take 40 mg by mouth at bedtime.     No current facility-administered medications for this visit.   Facility-Administered Medications Ordered in Other Visits  Medication Dose Route Frequency Provider Last Rate Last Admin   lidocaine (PF) (XYLOCAINE) 1 % injection 10 mL  10 mL Infiltration Once Mickie Kay, NP        Allergies as of 02/08/2023   (No Known Allergies)    Family History  Problem Relation Age of Onset   Heart disease Mother    Prostate cancer Father    Stroke Father     Social History   Socioeconomic History   Marital status: Married    Spouse name: Not on file   Number of children: Not on file   Years of education: Not on file   Highest education level: Not on file  Occupational History   Not on file  Tobacco Use   Smoking status: Former    Packs/day: .25    Types: Cigarettes    Passive exposure: Past   Smokeless tobacco: Never  Vaping Use   Vaping Use: Never used  Substance and Sexual Activity   Alcohol use:  Not Currently   Drug use: Not Currently   Sexual activity: Yes  Other Topics Concern   Not on file  Social History Narrative   Not on file   Social Determinants of Health   Financial Resource Strain: Not on file  Food Insecurity: No Food Insecurity (09/19/2022)   Hunger Vital Sign    Worried About Running Out of Food in the Last Year: Never true    Ran Out of Food in the Last Year: Never true  Transportation Needs: No Transportation Needs (09/19/2022)   PRAPARE - Administrator, Civil Service (Medical): No    Lack of Transportation (Non-Medical): No  Physical Activity: Not on file  Stress: Not on file  Social Connections: Not on file    Review of Systems: Gen: Denies fever, chills, anorexia. Denies fatigue, weakness, weight loss.  CV: Denies chest pain, palpitations, syncope, peripheral edema, and claudication. Resp: Denies dyspnea at rest, cough, wheezing, coughing up blood, and pleurisy. GI: Denies vomiting blood, jaundice, and fecal incontinence.   Denies dysphagia or odynophagia. Derm: Denies rash, itching, dry skin Psych: Denies depression, anxiety, memory loss, confusion. No homicidal or suicidal ideation.  Heme: Denies bruising, bleeding, and enlarged lymph nodes.  Physical Exam: There were no vitals taken for this visit. General:   Alert and oriented. No distress noted. Pleasant and cooperative.  Head:  Normocephalic and atraumatic. Eyes:  Conjuctiva clear without scleral icterus. Heart:  S1, S2 present without murmurs appreciated. Lungs:  Clear to auscultation bilaterally. No wheezes, rales, or rhonchi. No distress.  Abdomen:  +BS, soft, non-tender and non-distended. No rebound or guarding. No HSM or masses noted. Msk:  Symmetrical without gross deformities. Normal posture. Extremities:  Without edema. Neurologic:  Alert and  oriented x4 Psych:  Normal mood and affect.    Assessment:     Plan:  ***   Ermalinda Memos, PA-C [facility] 02/08/2023

## 2023-02-07 NOTE — Procedures (Signed)
PROCEDURE SUMMARY:  Successful US guided left thoracentesis. Yielded 550 ml of amber-colored fluid. Pt tolerated procedure well. No immediate complications.  Specimen not sent for labs. CXR ordered; no post-procedure pneumothorax identified.   EBL < 2 mL  Mickie Kay, NP 02/07/2023 10:39 AM

## 2023-02-08 ENCOUNTER — Ambulatory Visit (INDEPENDENT_AMBULATORY_CARE_PROVIDER_SITE_OTHER): Payer: Medicare Other | Admitting: Gastroenterology

## 2023-02-08 ENCOUNTER — Encounter: Payer: Self-pay | Admitting: Gastroenterology

## 2023-02-08 VITALS — BP 114/66 | HR 60 | Temp 98.2°F | Ht 71.0 in | Wt 186.8 lb

## 2023-02-08 DIAGNOSIS — K56699 Other intestinal obstruction unspecified as to partial versus complete obstruction: Secondary | ICD-10-CM

## 2023-02-08 NOTE — Patient Instructions (Addendum)
Please stop omeprazole as this medication will interact with Plavix.  We will hold off on prescribing any additional reflux medication as you never had heartburn.  Monitor for heartburn or reflux and let me know if this occurs after you stop omeprazole.  I am glad you are doing well overall!  Will plan to see you back as needed.  Do not hesitate to call if you have any concerns.  It was very nice to meet you today!  Ermalinda Memos, PA-C [facility]

## 2023-02-21 ENCOUNTER — Telehealth (HOSPITAL_COMMUNITY): Payer: Self-pay

## 2023-02-21 ENCOUNTER — Encounter (HOSPITAL_COMMUNITY): Payer: Self-pay

## 2023-02-21 NOTE — Telephone Encounter (Signed)
Cardiac Rehab Medication Review by a Nurse   Does the patient  feel that his/her medications are working for him/her?  yes   Has the patient been experiencing any side effects to the medications prescribed?  no   Does the patient measure his/her own blood pressure or blood glucose at home?  no    Does the patient have any problems obtaining medications due to transportation or finances?   no   Understanding of regimen: good Understanding of indications: good Potential of compliance: excellent     REVIEWED MEDICATIONS WITH PATIENT'S WIFE VIA PHONE.  Nurse comments:

## 2023-02-22 ENCOUNTER — Encounter (HOSPITAL_COMMUNITY)
Admission: RE | Admit: 2023-02-22 | Discharge: 2023-02-22 | Disposition: A | Discharge status: Home / Self Care | Payer: Medicare Other | Source: Ambulatory Visit | Attending: Internal Medicine | Admitting: Internal Medicine

## 2023-02-22 ENCOUNTER — Encounter (HOSPITAL_COMMUNITY): Payer: Self-pay

## 2023-02-22 VITALS — BP 110/52 | HR 65 | Ht 71.0 in | Wt 185.6 lb

## 2023-02-22 DIAGNOSIS — I213 ST elevation (STEMI) myocardial infarction of unspecified site: Secondary | ICD-10-CM | POA: Insufficient documentation

## 2023-02-22 DIAGNOSIS — Z951 Presence of aortocoronary bypass graft: Secondary | ICD-10-CM | POA: Diagnosis present

## 2023-02-22 NOTE — Progress Notes (Signed)
Cardiac Individual Treatment Plan  Patient Details  Name: Nicholas Dominguez MRN: [id number] Date of Birth: Mar 18, 1951 Referring Provider:   Flowsheet Row CARDIAC REHAB PHASE II ORIENTATION from 02/22/2023 in [facility]  Referring Provider Lynnette Caffey       Initial Encounter Date:  Flowsheet Row CARDIAC REHAB PHASE II ORIENTATION from 02/22/2023 in [facility] CARDIAC REHABILITATION  Date 02/22/23       Visit Diagnosis: S/P CABG x 3  ST elevation myocardial infarction ([name]), unspecified artery  Patient's Home Medications on Admission:  Current Outpatient Medications:    Acetaminophen (TYLENOL 8 HOUR PO), Take 500 mg by mouth daily at 6 (six) AM. Pt takes 1000 mg 2 tablets once a day., Disp: , Rfl:    aspirin 81 MG EC tablet, Take 81 mg by mouth daily., Disp: , Rfl:    atorvastatin (LIPITOR) 80 MG tablet, Take 1 tablet (80 mg total) by mouth daily., Disp: 90 tablet, Rfl: 3   clopidogrel (PLAVIX) 75 MG tablet, Take 1 tablet (75 mg total) by mouth daily., Disp: 90 tablet, Rfl: 3   docusate (COLACE) 50 MG/5ML liquid, Take 50 mg by mouth daily., Disp: , Rfl:    Ferrous Sulfate (IRON) 325 (65 Fe) MG TABS, Take 1 tablet (325 mg total) by mouth daily. (Patient taking differently: Take 65 mg by mouth daily.), Disp: 30 tablet, Rfl: 0   Garlic 1000 MG CAPS, Take 1 capsule by mouth daily., Disp: , Rfl:    metoprolol tartrate (LOPRESSOR) 25 MG tablet, Take 1 tablet (25 mg total) by mouth 2 (two) times daily., Disp: 180 tablet, Rfl: 3   Multiple Vitamin (MULTIVITAMIN) tablet, Take 1 tablet by mouth daily., Disp: , Rfl:    NIACIN PO, Take by mouth. 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   [name] (ST elevation myocardial infarction) 11/2022   S/P CABG   Tobacco use     Tobacco Use: Social History   Tobacco Use  Smoking Status  Former   Packs/day: .25   Types: Cigarettes   Passive exposure: Past  Smokeless Tobacco Never    Labs: Review Flowsheet       Latest Ref Rng & Units 12/14/2022 12/18/2022  Labs for ITP Cardiac and Pulmonary Rehab  Cholestrol 0 - 200 mg/dL 409  811  -  LDL (calc) 0 - 99 mg/dL 914  782  -  HDL-C >95 mg/dL 55  65  -  Trlycerides <150 mg/dL 36  85  -  Hemoglobin A2Z 4.8 - 5.6 % 5.3  5.1  -  PH, Arterial 7.35 - 7.45 - 7.313  7.331  7.352  7.403  7.336  7.375  7.44   PCO2 arterial 32 - 48 mmHg - 43.8  42.0  41.3  32.9  42.7  40.5  40   Bicarbonate 20.0 - 28.0 mmol/L - 22.1  22.1  23.1  20.5  22.8  23.7  27.2   TCO2 22 - 32 mmol/L - 23  23  24  20  21  25  25  24  25  25  25    Acid-base deficit 0.0 - 2.0 mmol/L - 4.0  4.0  3.0  4.0  3.0  1.0   O2 Saturation % - 96  98  97  100  100  100  97.8  Capillary Blood Glucose: Lab Results  Component Value Date   GLUCAP 99 12/23/2022   GLUCAP 94 12/23/2022   GLUCAP 107 (H) 12/23/2022   GLUCAP 110 (H) 12/22/2022   GLUCAP 100 (H) 12/22/2022     Exercise Target Goals: Exercise Program Goal: Individual exercise prescription set using results from initial 6 min walk test and THRR while considering  patient's activity barriers and safety.   Exercise Prescription Goal: Starting with aerobic activity 30 plus minutes a day, 3 days per week for initial exercise prescription. Provide home exercise prescription and guidelines that participant acknowledges understanding prior to discharge.  Activity Barriers & Risk Stratification:  Activity Barriers & Cardiac Risk Stratification - 02/22/23 0829       Activity Barriers & Cardiac Risk Stratification   Activity Barriers None    Cardiac Risk Stratification High             6 Minute Walk:  6 Minute Walk     Row Name 02/22/23 0923         6 Minute Walk   Phase Initial     Distance 1400 feet     Walk Time 6 minutes     # of Rest Breaks 0     MPH 2.65     METS 2.8     RPE 12      VO2 Peak 9.83     Symptoms Yes (comment)     Comments L hip pain (6/10) when walking extended time     Resting HR 65 bpm     Resting BP 110/52     Resting Oxygen Saturation  95 %     Exercise Oxygen Saturation  during 6 min walk 96 %     Max Ex. HR 80 bpm     Max Ex. BP 118/54     2 Minute Post BP 108/54              Oxygen Initial Assessment:   Oxygen Re-Evaluation:   Oxygen Discharge (Final Oxygen Re-Evaluation):   Initial Exercise Prescription:  Initial Exercise Prescription - 02/22/23 0900       Date of Initial Exercise RX and Referring Provider   Date 02/22/23    Referring Provider Thukkani    Expected Discharge Date 05/16/23      Treadmill   MPH 1.3    Grade 0    Minutes 17      NuStep   Level 1    SPM 60    Minutes 22      Prescription Details   Frequency (times per week) 3    Duration Progress to 30 minutes of continuous aerobic without signs/symptoms of physical distress      Intensity   THRR 40-80% of Max Heartrate 59-118    Ratings of Perceived Exertion 11-13      Resistance Training   Training Prescription Yes    Weight 3    Reps 10-15             Perform Capillary Blood Glucose checks as needed.  Exercise Prescription Changes:   Exercise Comments:   Exercise Goals and Review:   Exercise Goals     Row Name 02/22/23 0925             Exercise Goals   Increase Physical Activity Yes       Intervention Provide advice, education, support and counseling about physical activity/exercise needs.;Develop an individualized exercise prescription for aerobic and resistive training based on initial evaluation  findings, risk stratification, comorbidities and participant's personal goals.       Expected Outcomes Short Term: Attend rehab on a regular basis to increase amount of physical activity.;Long Term: Add in home exercise to make exercise part of routine and to increase amount of physical activity.;Long Term: Exercising regularly at  least 3-5 days a week.       Increase Strength and Stamina Yes       Intervention Develop an individualized exercise prescription for aerobic and resistive training based on initial evaluation findings, risk stratification, comorbidities and participant's personal goals.;Provide advice, education, support and counseling about physical activity/exercise needs.       Expected Outcomes Short Term: Increase workloads from initial exercise prescription for resistance, speed, and METs.;Short Term: Perform resistance training exercises routinely during rehab and add in resistance training at home;Long Term: Improve cardiorespiratory fitness, muscular endurance and strength as measured by increased METs and functional capacity ( )       Able to understand and use rate of perceived exertion (RPE) scale Yes       Intervention Provide education and explanation on how to use RPE scale       Expected Outcomes Short Term: Able to use RPE daily in rehab to express subjective intensity level;Long Term:  Able to use RPE to guide intensity level when exercising independently       Knowledge and understanding of Target Heart Rate Range (THRR) Yes       Intervention Provide education and explanation of THRR including how the numbers were predicted and where they are located for reference       Expected Outcomes Short Term: Able to state/look up THRR;Long Term: Able to use THRR to govern intensity when exercising independently;Short Term: Able to use daily as guideline for intensity in rehab       Able to check pulse independently Yes       Intervention Provide education and demonstration on how to check pulse in carotid and radial arteries.;Review the importance of being able to check your own pulse for safety during independent exercise       Expected Outcomes Short Term: Able to explain why pulse checking is important during independent exercise;Long Term: Able to check pulse independently and accurately        Understanding of Exercise Prescription Yes       Intervention Provide education, explanation, and written materials on patient's individual exercise prescription       Expected Outcomes Short Term: Able to explain program exercise prescription;Long Term: Able to explain home exercise prescription to exercise independently                Exercise Goals Re-Evaluation :    Discharge Exercise Prescription (Final Exercise Prescription Changes):   Nutrition:  Target Goals: Understanding of nutrition guidelines, daily intake of sodium 1500mg , cholesterol 200mg , calories 30% from fat and 7% or less from saturated fats, daily to have 5 or more servings of fruits and vegetables.  Biometrics:  Pre Biometrics - 02/22/23 0925       Pre Biometrics   Height  (1.803 m)    Weight 84.2 kg    Waist Circumference 39 inches    Hip Circumference 42 inches    Waist to Hip Ratio 0.93 %    BMI (Calculated) 25.9    Triceps Skinfold 12 mm    % Body Fat 25.4 %    Grip Strength 35.7 kg    Flexibility 0 in    Single  Leg Stand 6 seconds              Nutrition Therapy Plan and Nutrition Goals:  Nutrition Therapy & Goals - 02/22/23 0923       Personal Nutrition Goals   Comments Patient scored 50 on his diet assessment. Handout explained and provided on healthier choices. We offer educational sessions on heart healthy nutrition with handouts and assistance with RD referral if patient is interested.      Intervention Plan   Intervention Nutrition handout(s) given to patient.    Expected Outcomes Short Term Goal: Understand basic principles of dietary content, such as calories, fat, sodium, cholesterol and nutrients.             Nutrition Assessments:  Nutrition Assessments - 02/22/23 0922       MEDFICTS Scores   Pre Score 50            MEDIFICTS Score Key: ?70 Need to make dietary changes  40-70 Heart Healthy Diet ? 40 Therapeutic Level Cholesterol Diet   Picture  Your Plate Scores: <13 Unhealthy dietary pattern with much room for improvement. 41-50 Dietary pattern unlikely to meet recommendations for good health and room for improvement. 51-60 More healthful dietary pattern, with some room for improvement.  >60 Healthy dietary pattern, although there may be some specific behaviors that could be improved.    Nutrition Goals Re-Evaluation:   Nutrition Goals Discharge (Final Nutrition Goals Re-Evaluation):   Psychosocial: Target Goals: Acknowledge presence or absence of significant depression and/or stress, maximize coping skills, provide positive support system. Participant is able to verbalize types and ability to use techniques and skills needed for reducing stress and depression.  Initial Review & Psychosocial Screening:  Initial Psych Review & Screening - 02/22/23 0922       Initial Review   Current issues with None Identified      Family Dynamics   Good Support System? Yes      Barriers   Psychosocial barriers to participate in program There are no identifiable barriers or psychosocial needs.      Screening Interventions   Interventions Encouraged to exercise;To provide support and resources with identified psychosocial needs;Provide feedback about the scores to participant    Expected Outcomes Short Term goal: Identification and review with participant of any Quality of Life or Depression concerns found by scoring the questionnaire.             Quality of Life Scores:  Quality of Life - 02/22/23 0926       Quality of Life   Select Quality of Life      Quality of Life Scores   Health/Function Pre 29.57 %    Socioeconomic Pre 30 %    Psych/Spiritual Pre 30 %    Family Pre 30 %    GLOBAL Pre 29.81 %            Scores of 19 and below usually indicate a poorer quality of life in these areas.  A difference of  2-3 points is a clinically meaningful difference.  A difference of 2-3 points in the total score of the Quality  of Life Index has been associated with significant improvement in overall quality of life, self-image, physical symptoms, and general health in studies assessing change in quality of life.  PHQ-9: Review Flowsheet       02/22/2023  Depression screen PHQ 2/9  Decreased Interest 0  Down, Depressed, Hopeless 0  PHQ - 2 Score 0  Altered  sleeping 0  Tired, decreased energy 0  Change in appetite 0  Feeling bad or failure about yourself  0  Trouble concentrating 0  Moving slowly or fidgety/restless 0  Suicidal thoughts 0  PHQ-9 Score 0  Difficult doing work/chores Not difficult at all   Interpretation of Total Score  Total Score Depression Severity:  1-4 = Minimal depression, 5-9 = Mild depression, 10-14 = Moderate depression, 15-19 = Moderately severe depression, 20-27 = Severe depression   Psychosocial Evaluation and Intervention:  Psychosocial Evaluation - 02/22/23 0926       Psychosocial Evaluation & Interventions   Interventions Stress management education;Relaxation education;Encouraged to exercise with the program and follow exercise prescription    Comments Patient has no psychosocial barriers or issues identified at his orientation visit. His PHQ-9 score was 0. He denies any depression or anxiety. He has a good support system with his wife of many years and a son and a daughter. He has 3 grandchildren. He says he had been in great health up until 2023 when he had a small bowel blockage requiring surgery and then his [name] and CABG. He feels he has handled things well. He quit smoking 09/2022 without any assistance. 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 He enjoys wood working as a hobby. He wants to participate in the program but is not sure about the drive from [place], [place]. I encouraged him to do at least half of the program and he agreed.    Expected Outcomes Patient will continue to have no psychosocial barriers or  issues identified.    Continue Psychosocial Services  No Follow up required             Psychosocial Re-Evaluation:   Psychosocial Discharge (Final Psychosocial Re-Evaluation):   Vocational Rehabilitation: Provide vocational rehab assistance to qualifying candidates.   Vocational Rehab Evaluation & Intervention:  Vocational Rehab - 02/22/23 0924       Initial Vocational Rehab Evaluation & Intervention   Assessment shows need for Vocational Rehabilitation No      Vocational Rehab Re-Evaulation   Comments Patient is retired and does not need vocational rehab.             Education: Education Goals: Education classes will be provided on a weekly basis, covering required topics. Participant will state understanding/return demonstration of topics presented.  Learning Barriers/Preferences:  Learning Barriers/Preferences - 02/22/23 0924       Learning Barriers/Preferences   Learning Barriers Hearing    Learning Preferences Skilled Demonstration;Pictoral             Education Topics: Hypertension, Hypertension Reduction -Define heart disease and high blood pressure. Discus how high blood pressure affects the body and ways to reduce high blood pressure.   Exercise and Your Heart -Discuss why it is important to exercise, the FITT principles of exercise, normal and abnormal responses to exercise, and how to exercise safely.   Angina -Discuss definition of angina, causes of angina, treatment of angina, and how to decrease risk of having angina.   Cardiac Medications -Review what the following cardiac medications are used for, how they affect the body, and side effects that may occur when taking the medications.  Medications include Aspirin, Beta blockers, calcium channel blockers, ACE Inhibitors, angiotensin receptor blockers, diuretics, digoxin, and antihyperlipidemics.   Congestive Heart Failure -Discuss the definition of CHF, how to live with CHF, the signs  and symptoms of CHF, and how  keep track of weight and sodium intake.   Heart Disease and Intimacy -Discus the effect sexual activity has on the heart, how changes occur during intimacy as we age, and safety during sexual activity.   Smoking Cessation / COPD -Discuss different methods to quit smoking, the health benefits of quitting smoking, and the definition of COPD.   Nutrition I: Fats -Discuss the types of cholesterol, what cholesterol does to the heart, and how cholesterol levels can be controlled.   Nutrition II: Labels -Discuss the different components of food labels and how to read food label   Heart Parts/Heart Disease and PAD -Discuss the anatomy of the heart, the pathway of blood circulation through the heart, and these are affected by heart disease.   Stress I: Signs and Symptoms -Discuss the causes of stress, how stress may lead to anxiety and depression, and ways to limit stress.   Stress II: Relaxation -Discuss different types of relaxation techniques to limit stress.   Warning Signs of Stroke / TIA -Discuss definition of a stroke, what the signs and symptoms are of a stroke, and how to identify when someone is having stroke.   Knowledge Questionnaire Score:  Knowledge Questionnaire Score - 02/22/23 0924       Knowledge Questionnaire Score   Pre Score 20/24             Core Components/Risk Factors/Patient Goals at Admission:  Personal Goals and Risk Factors at Admission - 02/22/23 0924       Core Components/Risk Factors/Patient Goals on Admission    Weight Management Weight Maintenance    Lipids Yes    Intervention Provide education and support for participant on nutrition & aerobic/resistive exercise along with prescribed medications to achieve LDL 70mg , HDL >40mg .    Expected Outcomes Short Term: Participant states understanding of desired cholesterol values and is compliant with medications prescribed. Participant is following exercise  prescription and nutrition guidelines.;Long Term: Cholesterol controlled with medications as prescribed, with individualized exercise RX and with personalized nutrition plan. Value goals: LDL < , HDL > 40 mg.    Personal Goal Other Yes    Personal Goal Patient wants to increase his strength and stamina and learn what he can do for exercise and activities.    Intervention Patient will attend CR 3 days/week with exercise and education.    Expected Outcomes Patient will complete the program meeting both personal and program goals.             Core Components/Risk Factors/Patient Goals Review:    Core Components/Risk Factors/Patient Goals at Discharge (Final Review):    ITP Comments:   Comments: Patient arrived for 1st visit/orientation/education at 0800. Patient was referred to CR by Dr. Brynda Greathouse due to S/P CABGx3 (Z95.1) and [name] (I21.3). During orientation advised patient on arrival and appointment times what to wear, what to do before, during and after exercise. Reviewed attendance and class policy.  Pt is scheduled to return Cardiac Rehab on 02/26/23 at 1100. Pt was advised to come to class 15 minutes before class starts.  Discussed RPE/Dpysnea scales. Patient participated in warm up stretches. Patient was able to complete 6 minute walk test.  Telemetry:NSR. Patient was measured for the equipment. Discussed equipment safety with patient. Took patient pre-anthropometric measurements. Patient finished visit at 0900.

## 2023-02-26 ENCOUNTER — Encounter (HOSPITAL_COMMUNITY)
Admission: RE | Admit: 2023-02-26 | Discharge: 2023-02-26 | Disposition: A | Discharge status: Home / Self Care | Payer: Medicare Other | Source: Ambulatory Visit | Attending: Internal Medicine | Admitting: Internal Medicine

## 2023-02-26 DIAGNOSIS — Z951 Presence of aortocoronary bypass graft: Secondary | ICD-10-CM

## 2023-02-26 DIAGNOSIS — I213 ST elevation (STEMI) myocardial infarction of unspecified site: Secondary | ICD-10-CM

## 2023-02-26 NOTE — Progress Notes (Signed)
Daily Session Note  Patient Details  Name: Nicholas Dominguez MRN: [id number] Date of Birth: Jun 16, 1951 Referring Provider:   Flowsheet Row CARDIAC REHAB PHASE II ORIENTATION from 02/22/2023 in [facility] CARDIAC REHABILITATION  Referring Provider Lynnette Caffey       Encounter Date: 02/26/2023  Check In:  Session Check In - 02/26/23 1100       Check-In   Supervising physician immediately available to respond to emergencies [facility] MD immediately available    Physician(s) Dr. Dina Rich    Location [facility]    Staff Present Rodena Medin, RN, Pleas Koch, RN, BSN    Virtual Visit No    Medication changes reported     No    Fall or balance concerns reported    No    Tobacco Cessation No Change    Warm-up and Cool-down Performed as group-led Writer Performed Yes    VAD Patient? No    PAD/SET Patient? No      Pain Assessment   Currently in Pain? No/denies    Pain Score 0-No pain    Multiple Pain Sites No             Capillary Blood Glucose: No results found for this or any previous visit (from the past 24 hour(s)).    Social History   Tobacco Use  Smoking Status Former   Packs/day: .25   Types: Cigarettes   Passive exposure: Past  Smokeless Tobacco Never    Goals Met:  Independence with exercise equipment Exercise tolerated well No report of concerns or symptoms today Strength training completed today  Goals Unmet:  Not Applicable  Comments: Check out 1200.   Dr. Dina Rich is Medical Director for [facility] Cardiac Rehab

## 2023-02-28 ENCOUNTER — Encounter (HOSPITAL_COMMUNITY)
Admission: RE | Admit: 2023-02-28 | Discharge: 2023-02-28 | Disposition: A | Discharge status: Home / Self Care | Payer: Medicare Other | Source: Ambulatory Visit | Attending: Internal Medicine | Admitting: Internal Medicine

## 2023-02-28 DIAGNOSIS — I213 ST elevation (STEMI) myocardial infarction of unspecified site: Secondary | ICD-10-CM | POA: Diagnosis present

## 2023-02-28 DIAGNOSIS — Z951 Presence of aortocoronary bypass graft: Secondary | ICD-10-CM

## 2023-02-28 NOTE — Progress Notes (Signed)
Daily Session Note  Patient Details  Name: Nicholas Dominguez MRN: [id number] Date of Birth: 06/21/1951 Referring Provider:   Flowsheet Row CARDIAC REHAB PHASE II ORIENTATION from 02/22/2023 in [facility] CARDIAC REHABILITATION  Referring Provider Lynnette Caffey       Encounter Date: 02/28/2023  Check In:  Session Check In - 02/28/23 1100       Check-In   Supervising physician immediately available to respond to emergencies [facility] MD immediately available    Physician(s) Dr. Dina Rich    Location [facility]    Staff Present Ross Ludwig, BS, Exercise Physiologist;Izzy Doubek BSN, RN;Debra Laural Benes, RN, BSN    Virtual Visit No    Medication changes reported     No    Fall or balance concerns reported    No    Tobacco Cessation No Change    Warm-up and Cool-down Performed as group-led instruction    Resistance Training Performed Yes    VAD Patient? No    PAD/SET Patient? No      Pain Assessment   Currently in Pain? No/denies    Pain Score 0-No pain    Multiple Pain Sites No             Capillary Blood Glucose: No results found for this or any previous visit (from the past 24 hour(s)).    Social History   Tobacco Use  Smoking Status Former   Packs/day: .25   Types: Cigarettes   Passive exposure: Past  Smokeless Tobacco Never    Goals Met:  Independence with exercise equipment Exercise tolerated well No report of concerns or symptoms today Strength training completed today  Goals Unmet:  Not Applicable  Comments: check out at 12:00   Dr. Dina Rich is Medical Director for [facility] Cardiac Rehab

## 2023-03-02 ENCOUNTER — Encounter (HOSPITAL_COMMUNITY)
Admission: RE | Admit: 2023-03-02 | Discharge: 2023-03-02 | Disposition: A | Discharge status: Home / Self Care | Payer: Medicare Other | Source: Ambulatory Visit | Attending: Internal Medicine | Admitting: Internal Medicine

## 2023-03-02 DIAGNOSIS — Z951 Presence of aortocoronary bypass graft: Secondary | ICD-10-CM

## 2023-03-02 DIAGNOSIS — I213 ST elevation (STEMI) myocardial infarction of unspecified site: Secondary | ICD-10-CM

## 2023-03-02 NOTE — Progress Notes (Signed)
Daily Session Note  Patient Details  Name: Nicholas Dominguez MRN: [id number] Date of Birth: [birth year] Referring Provider:   Flowsheet Row CARDIAC REHAB PHASE II ORIENTATION from 02/22/2023 in [facility] CARDIAC REHABILITATION  Referring Provider Lynnette Caffey       Encounter Date: 03/02/2023  Check In:  Session Check In - 03/02/23 1058       Check-In   Supervising physician immediately available to respond to emergencies [facility] MD immediately available    Physician(s) Dr. Dina Rich    Staff Present Ross Ludwig, BS, Exercise Physiologist;Debra Laural Benes, RN, Pleas Koch, RN, BSN    Virtual Visit No    Medication changes reported     No    Fall or balance concerns reported    No    Tobacco Cessation No Change    Warm-up and Cool-down Performed as group-led instruction    Resistance Training Performed Yes      Pain Assessment   Currently in Pain? No/denies    Pain Score 0-No pain             Capillary Blood Glucose: No results found for this or any previous visit (from the past 24 hour(s)).    Social History   Tobacco Use  Smoking Status Former   Packs/day: .25   Types: Cigarettes   Passive exposure: Past  Smokeless Tobacco Never    Goals Met:  Independence with exercise equipment Exercise tolerated well No report of concerns or symptoms today Strength training completed today  Goals Unmet:  Not Applicable  Comments: Checkout at 1200.   Dr. Dina Rich is Medical Director for [facility] Cardiac Rehab

## 2023-03-05 ENCOUNTER — Encounter (HOSPITAL_COMMUNITY)
Admission: RE | Admit: 2023-03-05 | Discharge: 2023-03-05 | Disposition: A | Discharge status: Home / Self Care | Payer: Medicare Other | Source: Ambulatory Visit | Attending: Internal Medicine | Admitting: Internal Medicine

## 2023-03-05 VITALS — Wt 186.9 lb

## 2023-03-05 DIAGNOSIS — Z951 Presence of aortocoronary bypass graft: Secondary | ICD-10-CM | POA: Diagnosis not present

## 2023-03-05 DIAGNOSIS — I213 ST elevation (STEMI) myocardial infarction of unspecified site: Secondary | ICD-10-CM

## 2023-03-05 NOTE — Progress Notes (Signed)
Daily Session Note  Patient Details  Name: Nicholas Dominguez MRN: [id number] Date of Birth: Nov 06, 1950 Referring Provider:   Flowsheet Row CARDIAC REHAB PHASE II ORIENTATION from 02/22/2023 in [facility] CARDIAC REHABILITATION  Referring Provider Lynnette Caffey       Encounter Date: 03/05/2023  Check In:  Session Check In - 03/05/23 1100       Check-In   Supervising physician immediately available to respond to emergencies [facility] MD immediately available    Physician(s) Dr. Tenny Craw    Location [facility]    Staff Present Ross Ludwig, BS, Exercise Physiologist;Kemontae Dunklee BSN, RN;Debra Laural Benes, RN, BSN    Virtual Visit No    Medication changes reported     No    Fall or balance concerns reported    No    Tobacco Cessation No Change    Warm-up and Cool-down Performed as group-led instruction    Resistance Training Performed Yes    VAD Patient? No    PAD/SET Patient? No      Pain Assessment   Currently in Pain? No/denies    Pain Score 0-No pain    Multiple Pain Sites No             Capillary Blood Glucose: No results found for this or any previous visit (from the past 24 hour(s)).    Social History   Tobacco Use  Smoking Status Former   Packs/day: .25   Types: Cigarettes   Passive exposure: Past  Smokeless Tobacco Never    Goals Met:  Independence with exercise equipment Exercise tolerated well No report of concerns or symptoms today Strength training completed today  Goals Unmet:  Not Applicable  Comments: check out at 12:00   Dr. Dina Rich is Medical Director for [facility] Cardiac Rehab

## 2023-03-06 NOTE — Progress Notes (Signed)
Cardiology Office Note:    Date:  03/12/2023   ID:  Nicholas Dominguez, DOB [birth year], MRN [id number]  PCP:  No primary care provider on file.  [facility] Providers Cardiologist:  Marjo Bicker, MD     Referring MD: Lanelle Bal, DO   Chief Complaint:  Follow-up     History of Present Illness:   Nicholas Dominguez is a 72 y.o. male with  history of  CAD s/p inferior [name] with CABG X 3 2/19//24   (LIMA LAD, RSVG Diag, OM2) with post op Afib treated with amio , GERD, tobacco abuse.     Patient was seen in our office 01/05/23 and amiodarone stopped. He underwent left thoracentesis 02/07/23 with removal of 550 ml fluid. Breathing improved.  Patient comes in with his wife. Doing cardiac rehab. His wife feels like he has some depression and sits around more. Has a wood working shop and hasn't started back yet. Once he does his attitude will improve.  He wants to know his lifting limitations.       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 Pt takes 1000 mg 2 tablets once a day.   aspirin 81 MG EC tablet Take 81 mg by mouth daily.   atorvastatin (LIPITOR) 80 MG tablet Take 1 tablet (80 mg total) by mouth daily.   clopidogrel (PLAVIX) 75 MG tablet Take 1 tablet (75 mg total) by mouth daily.   docusate (COLACE) 50 MG/5ML liquid Take 50 mg by mouth daily.   Ferrous Sulfate (IRON) 325 (65 Fe) MG TABS Take 1 tablet (325 mg total) by mouth daily. (Patient taking differently: Take 65 mg by mouth daily.)   Garlic 1000 MG CAPS Take 1 capsule by mouth daily.   metoprolol tartrate (LOPRESSOR) 25 MG tablet Take 1 tablet (25 mg total) by mouth 2 (two) times daily.   Multiple  Vitamin (MULTIVITAMIN) tablet Take 1 tablet by mouth daily.   NIACIN PO Take by mouth. Otc one daily    Allergies:   Patient has no known allergies.   Social History   Tobacco Use   Smoking status: Former    Packs/day: .25    Types: Cigarettes    Passive exposure: Past   Smokeless tobacco: Never  Vaping Use   Vaping Use: Never used  Substance Use Topics   Alcohol use: Not Currently   Drug use: Not Currently    Family Hx: The patient's family history includes Heart disease in his mother; Prostate cancer in his father; Stroke in his father.  ROS     Physical Exam:    VS:  BP 126/62   Pulse (!) 58   Ht 5\' 11"  (1.803 m)   Wt 188 lb (85.3 kg)   SpO2 100%   BMI 26.22 kg/m     Wt Readings from Last 3 Encounters:  03/12/23 188 lb (85.3 kg)  03/05/23 186 lb 15.2 oz (84.8 kg)  02/22/23 185 lb 10 oz (84.2 kg)    Physical Exam  GEN: Well nourished, well developed, in no acute distress  Neck: no JVD, carotid bruits, or masses Cardiac:RRR; no murmurs,  rubs, or gallops  Respiratory:  clear to auscultation bilaterally, normal work of breathing GI: soft, nontender, nondistended, + BS Ext: without cyanosis, clubbing, or edema, Good distal pulses bilaterally Neuro:  Alert and Oriented x 3,  Psych: euthymic mood, full affect        EKGs/Labs/Other Test Reviewed:    EKG:  EKG is not ordered today.    Recent Labs: 12/19/2022: Magnesium 2.5 01/05/2023: ALT 5; BUN 16; Creatinine, Ser 1.22; Hemoglobin 10.6; Platelets 697; Potassium 4.8; Sodium 141; TSH 3.210   Recent Lipid Panel Recent Labs    12/14/22 1530  CHOL 177  TRIG 36  HDL 55  VLDL 7  LDLCALC 115*     Prior CV Studies:    CARDIAC CATHETERIZATION   CARDIAC CATHETERIZATION 12/14/2022   Narrative   1st Diag lesion is 70% stenosed.   Prox LAD lesion is 80% stenosed.   2nd Mrg lesion is 80% stenosed.   Prox RCA to Mid RCA lesion is 55% stenosed.   1.  Multivessel disease consisting of proximal LAD, first  diagonal, and second obtuse marginal lesions.  There is moderate disease of the mid right coronary artery which is not significant with an RFR of 0.93.   Recommendation: Given that the patient has TIMI-3 flow in all vessels and is chest pain-free, the patient will be treated medically and referred for surgical revascularization.   Findings Coronary Findings Diagnostic  Dominance: Right   Left Anterior Descending Prox LAD lesion is 80% stenosed.   First Diagonal Branch 1st Diag lesion is 70% stenosed.   Left Circumflex   Second Obtuse Marginal Branch 2nd Mrg lesion is 80% stenosed.   Right Coronary Artery Prox RCA to Mid RCA lesion is 55% stenosed.   Intervention   No interventions have been documented.     ECHOCARDIOGRAM   ECHOCARDIOGRAM COMPLETE 12/15/2022   Narrative ECHOCARDIOGRAM REPORT       Patient Name:   Nicholas Dominguez Date of Exam: 12/15/2022 Medical Rec #:  [id number]     Height:       71.0 in Accession #:    [id number]    Weight:       176.9 lb Date of Birth:  06/22/1951     BSA:          2.001 m Patient Age:    71 years      BP:           119/71 mmHg Patient Gender: M             HR:           72 bpm. Exam Location:  Inpatient   Procedure: 2D Echo, Cardiac Doppler and Color Doppler   Indications:    CAD of native vessel   History:        Patient has no prior history of Echocardiogram examinations.   Sonographer:    Mike Gip Referring Phys: Eliezer Lofts LIGHTFOOT   IMPRESSIONS     1. Left ventricular ejection fraction, by estimation, is 50 to 55%. The left ventricle has low normal function. The left ventricle has no regional wall motion abnormalities. There is moderate left ventricular hypertrophy. Left ventricular diastolic parameters are consistent with Grade I diastolic dysfunction (impaired relaxation). 2. Right ventricular systolic function is normal. The right ventricular size is normal. There is normal pulmonary artery systolic pressure. The  estimated right ventricular systolic pressure is 25.6 mmHg. 3. The mitral valve is normal in structure. Trivial mitral valve regurgitation. No  evidence of mitral stenosis. 4. The aortic valve is normal in structure. Aortic valve regurgitation is not visualized. No aortic stenosis is present. 5. The inferior vena cava is dilated in size with >50% respiratory variability, suggesting right atrial pressure of 8 mmHg.   FINDINGS Left Ventricle: Left ventricular ejection fraction, by estimation, is 50 to 55%. The left ventricle has low normal function. The left ventricle has no regional wall motion abnormalities. The left ventricular internal cavity size was normal in size. There is moderate left ventricular hypertrophy. Left ventricular diastolic parameters are consistent with Grade I diastolic dysfunction (impaired relaxation).     LV Wall Scoring: The mid and distal anterior septum is akinetic.   Right Ventricle: The right ventricular size is normal. No increase in right ventricular wall thickness. Right ventricular systolic function is normal. There is normal pulmonary artery systolic pressure. The tricuspid regurgitant velocity is 2.10 m/s, and with an assumed right atrial pressure of 8 mmHg, the estimated right ventricular systolic pressure is 25.6 mmHg.   Left Atrium: Left atrial size was normal in size.   Right Atrium: Right atrial size was normal in size.   Pericardium: There is no evidence of pericardial effusion.   Mitral Valve: The mitral valve is normal in structure. Trivial mitral valve regurgitation. No evidence of mitral valve stenosis.   Tricuspid Valve: The tricuspid valve is normal in structure. Tricuspid valve regurgitation is trivial. No evidence of tricuspid stenosis.   Aortic Valve: The aortic valve is normal in structure. Aortic valve regurgitation is not visualized. No aortic stenosis is present.   Pulmonic Valve: The pulmonic valve was normal in structure. Pulmonic  valve regurgitation is not visualized. No evidence of pulmonic stenosis.   Aorta: The aortic root is normal in size and structure.   Venous: The inferior vena cava is dilated in size with greater than 50% respiratory variability, suggesting right atrial pressure of 8 mmHg.   IAS/Shunts: No atrial level shunt detected by color flow Doppler.     LEFT VENTRICLE PLAX 2D LVIDd:         4.40 cm      Diastology LVIDs:         3.40 cm      LV e' medial:    7.72 cm/s LV PW:         1.40 cm      LV E/e' medial:  7.3 LV IVS:        1.30 cm      LV e' lateral:   11.40 cm/s LVOT diam:     2.20 cm      LV E/e' lateral: 5.0 LV SV:         71 LV SV Index:   35 LVOT Area:     3.80 cm   LV Volumes (MOD) LV vol d, MOD A2C: 142.0 ml LV vol d, MOD A4C: 139.0 ml LV vol s, MOD A2C: 65.3 ml LV vol s, MOD A4C: 61.8 ml LV SV MOD A2C:     76.7 ml LV SV MOD A4C:     139.0 ml LV SV MOD BP:      79.2 ml   RIGHT VENTRICLE             IVC RV Basal diam:  3.80 cm     IVC diam: 2.60 cm RV S prime:     19.80 cm/s TAPSE (M-mode): 3.0 cm   LEFT ATRIUM  Index        RIGHT ATRIUM           Index LA diam:        3.40 cm 1.70 cm/m   RA Area:     15.00 cm LA Vol (A2C):   67.6 ml 33.78 ml/m  RA Volume:   37.20 ml  18.59 ml/m LA Vol (A4C):   57.2 ml 28.58 ml/m LA Biplane Vol: 62.2 ml 31.08 ml/m AORTIC VALVE LVOT Vmax:   88.60 cm/s LVOT Vmean:  60.500 cm/s LVOT VTI:    0.186 m   AORTA Ao Root diam: 3.70 cm Ao Asc diam:  3.60 cm   MITRAL VALVE               TRICUSPID VALVE MV Area (PHT): 2.38 cm    TV Peak grad:   17.6 mmHg MV Decel Time: 319 msec    TV Vmax:        2.10 m/s MV E velocity: 56.60 cm/s  TR Peak grad:   17.6 mmHg MV A velocity: 57.10 cm/s  TR Vmax:        210.00 cm/s MV E/A ratio:  0.99 SHUNTS Systemic VTI:  0.19 m Systemic Diam: 2.20 cm   Donato Schultz MD Electronically signed by Donato Schultz MD Signature Date/Time: 12/15/2022/11:43:28 AM       Final   TEE   ECHO  INTRAOPERATIVE TEE 12/20/2022   Narrative *INTRAOPERATIVE TRANSESOPHAGEAL REPORT *       Patient Name:   Nicholas Dominguez Date of Exam: 12/18/2022 Medical Rec #:  [id number]     Height:       71.0 in Accession #:    [id number]    Weight:       176.8 lb Date of Birth:  Mar 30, 1951     BSA:          2.00 m Patient Age:    71 years      BP:           114/71 mmHg Patient Gender: M             HR:           70 bpm. Exam Location:  Inpatient   Transesophogeal exam was perform intraoperatively during surgical procedure. Patient was closely monitored under general anesthesia during the entirety of examination.   Indications:     coronary artery bypass surgery Sonographer:     Delcie Roch RDCS Performing Phys: [id number] Eliezer Lofts LIGHTFOOT Diagnosing Phys: Marcene Duos MD   Complications: No known complications during this procedure. POST-OP IMPRESSIONS _ Left Ventricle: The left ventricle is unchanged from pre-bypass. _ Right Ventricle: The right ventricle appears unchanged from pre-bypass. _ Aorta: The aorta appears unchanged from pre-bypass. _ Left Atrium: The left atrium appears unchanged from pre-bypass. _ Left Atrial Appendage: The left atrial appendage appears unchanged from pre-bypass. _ Aortic Valve: The aortic valve appears unchanged from pre-bypass. _ Mitral Valve: The mitral valve appears unchanged from pre-bypass. _ Tricuspid Valve: The tricuspid valve appears unchanged from pre-bypass. _ Pulmonic Valve: The pulmonic valve appears unchanged from pre-bypass. _ Interatrial Septum: The interatrial septum appears unchanged from pre-bypass. _ Interventricular Septum: The interventricular septum appears unchanged from pre-bypass. _ Pericardium: The pericardium appears unchanged from pre-bypass.   PRE-OP FINDINGS Left Ventricle: The left ventricle has low normal systolic function, with an ejection fraction of 50-55%. The cavity size was normal. There is mild concentric left  ventricular hypertrophy.     Right  Ventricle: The right ventricle has normal systolic function. The cavity was normal. There is no increase in right ventricular wall thickness.   Left Atrium: Left atrial size was normal in size. No left atrial/left atrial appendage thrombus was detected.   Right Atrium: Right atrial size was normal in size.   Interatrial Septum: No atrial level shunt detected by color flow Doppler.   Pericardium: There is no evidence of pericardial effusion.   Mitral Valve: The mitral valve is normal in structure. Mitral valve regurgitation is not visualized by color flow Doppler. There is No evidence of mitral stenosis.   Tricuspid Valve: The tricuspid valve was normal in structure. Tricuspid valve regurgitation was not visualized by color flow Doppler.   Aortic Valve: The aortic valve is tricuspid Aortic valve regurgitation was not visualized by color flow Doppler. There is no stenosis of the aortic valve.     Pulmonic Valve: The pulmonic valve was normal in structure. Pulmonic valve regurgitation is not visualized by color flow Doppler.     Aorta: The aortic root, ascending aorta and aortic arch are normal in size and structure.   +--------------+--------++ LEFT VENTRICLE         +--------------+--------++ PLAX 2D                +--------------+--------++ LVIDd:        5.00 cm  +--------------+--------++ LVIDs:        3.70 cm  +--------------+--------++ LVOT diam:    2.40 cm  +--------------+--------++ LV SV:        60 ml    +--------------+--------++ LV SV Index:  29.90    +--------------+--------++ LVOT Area:    4.52 cm +--------------+--------++                        +--------------+--------++     +-------------+-------++ AORTA                +-------------+-------++ Ao Root diam:3.60 cm +-------------+-------++ Ao Asc diam: 3.10 cm +-------------+-------++      +--------------+-------+ SHUNTS                +--------------+-------+ Systemic Diam:2.40 cm +--------------+-------+     Marcene Duos MD Electronically signed by Marcene Duos MD Signature Date/Time: 12/20/2022/7:32:22 PM       Final   Risk Assessment/Calculations/Metrics:              ASSESSMENT & PLAN:   No problem-specific Assessment & Plan notes found for this encounter.    Coronary artery disease: -s/p MI followed by  CABG x 3 on 12/18/2022 and thoracentesis 01/2023. Overall doing well without symptoms. Continue cardiac rehab. -wants to know lifting restrictions-I have reached out to surgical team and asked patient to ask them as well.-Addendum-Dr. Cliffton Asters says he has no lifting restrictions any longer.     Postop atrial fibrillation:  -Patient initially treated with amiodarone but no longer on. Monitored at cardiac rehab and no recurrence.        Hyperlipidemia: -Patient's last LDL cholesterol was 115-repeat this month -Continue atorvastatin 80 mg daily                  Dispo:  No follow-ups on file.   Medication Adjustments/Labs and Tests Ordered: Current medicines are reviewed at length with the patient today.  Concerns regarding medicines are outlined above.  Tests Ordered: Orders Placed This Encounter  Procedures   Lipid Profile   Medication Changes: No orders of the defined types were  placed in this encounter.  Elson Clan, PA-C  03/12/2023 11:24 AM    [facility] [address] [place], [place], [place]  [address] Phone: [phone]; Fax: [phone]

## 2023-03-07 ENCOUNTER — Encounter (HOSPITAL_COMMUNITY)
Admission: RE | Admit: 2023-03-07 | Discharge: 2023-03-07 | Disposition: A | Discharge status: Home / Self Care | Payer: Medicare Other | Source: Ambulatory Visit | Attending: Internal Medicine | Admitting: Internal Medicine

## 2023-03-07 DIAGNOSIS — Z951 Presence of aortocoronary bypass graft: Secondary | ICD-10-CM

## 2023-03-07 DIAGNOSIS — I213 ST elevation (STEMI) myocardial infarction of unspecified site: Secondary | ICD-10-CM

## 2023-03-07 NOTE — Progress Notes (Signed)
Daily Session Note  Patient Details  Name: Nicholas Dominguez MRN: [id number] Date of Birth: 1950/12/16 Referring Provider:   Flowsheet Row CARDIAC REHAB PHASE II ORIENTATION from 02/22/2023 in [facility] CARDIAC REHABILITATION  Referring Provider Lynnette Caffey       Encounter Date: 03/07/2023  Check In:  Session Check In - 03/07/23 1100       Check-In   Supervising physician immediately available to respond to emergencies [facility] MD immediately available    Physician(s) Dr. Diona Browner    Location [facility]    Staff Present Ross Ludwig, BS, Exercise Physiologist;Benjamyn Hestand BSN, RN;Debra Laural Benes, RN, BSN    Virtual Visit No    Medication changes reported     No    Fall or balance concerns reported    No    Tobacco Cessation No Change    Warm-up and Cool-down Performed as group-led instruction    Resistance Training Performed Yes    VAD Patient? No    PAD/SET Patient? No      Pain Assessment   Currently in Pain? No/denies    Pain Score 0-No pain    Multiple Pain Sites No             Capillary Blood Glucose: No results found for this or any previous visit (from the past 24 hour(s)).    Social History   Tobacco Use  Smoking Status Former   Packs/day: .25   Types: Cigarettes   Passive exposure: Past  Smokeless Tobacco Never    Goals Met:  Independence with exercise equipment Exercise tolerated well No report of concerns or symptoms today Strength training completed today  Goals Unmet:  Not Applicable  Comments: check out at 12:00   Dr. Dina Rich is Medical Director for [facility] Cardiac Rehab

## 2023-03-09 ENCOUNTER — Encounter (HOSPITAL_COMMUNITY)
Admission: RE | Admit: 2023-03-09 | Discharge: 2023-03-09 | Disposition: A | Discharge status: Home / Self Care | Payer: Medicare Other | Source: Ambulatory Visit | Attending: Internal Medicine | Admitting: Internal Medicine

## 2023-03-09 DIAGNOSIS — Z951 Presence of aortocoronary bypass graft: Secondary | ICD-10-CM

## 2023-03-09 DIAGNOSIS — I213 ST elevation (STEMI) myocardial infarction of unspecified site: Secondary | ICD-10-CM

## 2023-03-09 NOTE — Progress Notes (Signed)
Daily Session Note  Patient Details  Name: Nicholas Dominguez MRN: [id number] Date of Birth: [birth year] Referring Provider:   Flowsheet Row CARDIAC REHAB PHASE II ORIENTATION from 02/22/2023 in [facility] CARDIAC REHABILITATION  Referring Provider Lynnette Caffey       Encounter Date: 03/09/2023  Check In:  Session Check In - 03/09/23 1056       Check-In   Supervising physician immediately available to respond to emergencies [facility] MD immediately available    Physician(s) Dr. Diona Browner    Location [facility]    Staff Present Ross Ludwig, BS, Exercise Physiologist;Debra Laural Benes, RN, Pleas Koch, RN, BSN    Virtual Visit No    Medication changes reported     No    Fall or balance concerns reported    No    Tobacco Cessation No Change    Warm-up and Cool-down Performed as group-led instruction    Resistance Training Performed Yes      Pain Assessment   Currently in Pain? No/denies    Pain Score 0-No pain             Capillary Blood Glucose: No results found for this or any previous visit (from the past 24 hour(s)).    Social History   Tobacco Use  Smoking Status Former   Packs/day: .25   Types: Cigarettes   Passive exposure: Past  Smokeless Tobacco Never    Goals Met:  Independence with exercise equipment Exercise tolerated well No report of concerns or symptoms today Strength training completed today  Goals Unmet:  Not Applicable  Comments: Checkout at 1200.   Dr. Dina Rich is Medical Director for [facility] Cardiac Rehab

## 2023-03-12 ENCOUNTER — Encounter (HOSPITAL_COMMUNITY): Admission: RE | Admit: 2023-03-12 | Payer: Medicare Other | Source: Ambulatory Visit

## 2023-03-12 ENCOUNTER — Encounter: Payer: Self-pay | Admitting: Physician Assistant

## 2023-03-12 ENCOUNTER — Ambulatory Visit: Payer: Medicare Other | Attending: Physician Assistant | Admitting: Physician Assistant

## 2023-03-12 VITALS — BP 126/62 | HR 58 | Ht 71.0 in | Wt 188.0 lb

## 2023-03-12 DIAGNOSIS — Z951 Presence of aortocoronary bypass graft: Secondary | ICD-10-CM | POA: Diagnosis present

## 2023-03-12 DIAGNOSIS — E785 Hyperlipidemia, unspecified: Secondary | ICD-10-CM | POA: Insufficient documentation

## 2023-03-12 NOTE — Patient Instructions (Signed)
Medication Instructions:  Your physician recommends that you continue on your current medications as directed. Please refer to the Current Medication list given to you today.   Labwork: Fasting Lipids on a day when you have cardiac rehab  Testing/Procedures: None today  Follow-Up: 4-5 months with MD  Any Other Special Instructions Will Be Listed Below (If Applicable).   Please reach out to surgeon regarding weight lifting restrictions  If you need a refill on your cardiac medications before your next appointment, please call your pharmacy.

## 2023-03-13 ENCOUNTER — Telehealth: Payer: Self-pay

## 2023-03-13 ENCOUNTER — Encounter: Payer: Self-pay | Admitting: *Deleted

## 2023-03-13 DIAGNOSIS — Z006 Encounter for examination for normal comparison and control in clinical research program: Secondary | ICD-10-CM

## 2023-03-13 NOTE — Telephone Encounter (Signed)
Spoke to patient's wife (okay per DPR) who verbalized understanding. Pt's spouse had no questions or concerns at this time.

## 2023-03-13 NOTE — Research (Signed)
12 Week Follow-Up Visit Completed*   []  Not Necessary, No Potential Adverse Events Or Medication Issues Reported On Completed Subject Questionnaire   [x]  Yes, Contact With Subject/Alternate Contact Completed   []  Yes, No Contact With Subject/Alternate Contact Completed, But Electronic Health Record Was Reviewed   []  No, Unable To Contact Subject/Alternate Contact   Have you reviewed Ongoing medications on the Targeted Concomitant Medication form and updated the form as needed?   [x]  Yes   []  No   Subject Status*   [x]  Continuing In Follow-up   []  At Risk For Lost To Follow-up   []  Withdrawal From All Future Study Activities Including Passive Follow-up By Electronic Health Record Review Or Contact With Healthcare Provider Or Family Member/Friend   []  Death   Vital Status*   [x]  Alive   []  Deceased   []  Unknown   Last Known To Be Alive Source*   [x]  Subject Completed Follow-up Questionnaire/Seen In Person/Via Telephone Contact   []  Family Member or Caretaker   []  Primary Physician Or Medical Records   []  Publicly Available Source   []  Other  Date of last dose taken   DAY/MONTH/YEAR n/a  Over the last 12 weeks did the subject miss any doses? N/a  Over the last 12 weeks did the subject restart Evolocumab after an interruption? N/a

## 2023-03-13 NOTE — Telephone Encounter (Signed)
-----   Message from Dyann Kief, PA-C sent at 03/13/2023  9:06 AM EDT ----- Can you let patient know that Dr. Cliffton Asters says he has no lifting restrictions any longer! Thanks, Elon Jester

## 2023-03-14 ENCOUNTER — Encounter (HOSPITAL_COMMUNITY)
Admission: RE | Admit: 2023-03-14 | Discharge: 2023-03-14 | Disposition: A | Discharge status: Home / Self Care | Payer: Medicare Other | Source: Ambulatory Visit | Attending: Internal Medicine | Admitting: Internal Medicine

## 2023-03-14 ENCOUNTER — Other Ambulatory Visit (HOSPITAL_COMMUNITY)
Admission: RE | Admit: 2023-03-14 | Discharge: 2023-03-14 | Disposition: A | Discharge status: Home / Self Care | Payer: Medicare Other | Source: Ambulatory Visit | Attending: Physician Assistant | Admitting: Physician Assistant

## 2023-03-14 DIAGNOSIS — E785 Hyperlipidemia, unspecified: Secondary | ICD-10-CM | POA: Insufficient documentation

## 2023-03-14 DIAGNOSIS — Z951 Presence of aortocoronary bypass graft: Secondary | ICD-10-CM

## 2023-03-14 DIAGNOSIS — I213 ST elevation (STEMI) myocardial infarction of unspecified site: Secondary | ICD-10-CM

## 2023-03-14 LAB — LIPID PANEL
Cholesterol: 110 mg/dL (ref 0–200)
HDL: 51 mg/dL (ref >40)
LDL Cholesterol: 47 mg/dL (ref 0–99)
Total CHOL/HDL Ratio: 2.2 RATIO
Triglycerides: 62 mg/dL (ref <150)
VLDL: 12 mg/dL (ref 0–40)

## 2023-03-14 NOTE — Progress Notes (Signed)
Daily Session Note  Patient Details  Name: Nicholas Dominguez MRN: [id number] Date of Birth: 1951-01-26 Referring Provider:   Flowsheet Row CARDIAC REHAB PHASE II ORIENTATION from 02/22/2023 in [facility] CARDIAC REHABILITATION  Referring Provider Lynnette Caffey       Encounter Date: 03/14/2023  Check In:  Session Check In - 03/14/23 1100       Check-In   Supervising physician immediately available to respond to emergencies [facility] MD immediately available    Physician(s) Dr.Mallpeddi    Location [facility]    Staff Present Ross Ludwig, BS, Exercise Physiologist;Hillary Leonidas Romberg BSN, RN    Virtual Visit No    Medication changes reported     No    Fall or balance concerns reported    No    Tobacco Cessation No Change    Warm-up and Cool-down Performed as group-led instruction    Resistance Training Performed Yes    VAD Patient? No    PAD/SET Patient? No      Pain Assessment   Currently in Pain? No/denies    Pain Score 0-No pain    Multiple Pain Sites No             Capillary Blood Glucose: Results for orders placed or performed during the hospital encounter of 03/14/23 (from the past 24 hour(s))  Lipid Profile     Status: None   Collection Time: 03/14/23 10:37 AM  Result Value Ref Range   Cholesterol 110 0 - 200 mg/dL   Triglycerides 62 <132 mg/dL   HDL 51 >44 mg/dL   Total CHOL/HDL Ratio 2.2 RATIO   VLDL 12 0 - 40 mg/dL   LDL Cholesterol 47 0 - 99 mg/dL      Social History   Tobacco Use  Smoking Status Former   Packs/day: .25   Types: Cigarettes   Passive exposure: Past  Smokeless Tobacco Never    Goals Met:  Independence with exercise equipment Exercise tolerated well No report of concerns or symptoms today Strength training completed today  Goals Unmet:  Not Applicable  Comments: check out 1200   Dr. Dina Rich is Medical Director for [facility] Cardiac Rehab

## 2023-03-15 ENCOUNTER — Telehealth: Payer: Self-pay

## 2023-03-15 NOTE — Telephone Encounter (Signed)
-----   Message from Dyann Kief, PA-C sent at 03/15/2023  7:56 AM EDT ----- Lipids look great. No changes thanks

## 2023-03-15 NOTE — Telephone Encounter (Signed)
Patients wife (okay per DPR) notified and verbalized understanding. Pt's wife had no questions or concerns at this time.

## 2023-03-16 ENCOUNTER — Encounter (HOSPITAL_COMMUNITY)
Admission: RE | Admit: 2023-03-16 | Discharge: 2023-03-16 | Disposition: A | Discharge status: Home / Self Care | Payer: Medicare Other | Source: Ambulatory Visit | Attending: Internal Medicine | Admitting: Internal Medicine

## 2023-03-16 DIAGNOSIS — I213 ST elevation (STEMI) myocardial infarction of unspecified site: Secondary | ICD-10-CM

## 2023-03-16 DIAGNOSIS — Z951 Presence of aortocoronary bypass graft: Secondary | ICD-10-CM

## 2023-03-16 NOTE — Progress Notes (Signed)
Daily Session Note  Patient Details  Name: CHARBEL MCCLEAF MRN: [id number] Date of Birth: 1950/11/30 Referring Provider:   Flowsheet Row CARDIAC REHAB PHASE II ORIENTATION from 02/22/2023 in [facility] CARDIAC REHABILITATION  Referring Provider Lynnette Caffey       Encounter Date: 03/16/2023  Check In:  Session Check In - 03/16/23 1049       Check-In   Supervising physician immediately available to respond to emergencies [facility] MD immediately available    Physician(s) Dr.Mallpeddi    Location [facility]    Staff Present Cristopher Peru MHA, MS, ACSM-CEP;Abdulhadi Stopa Laural Benes, RN, Pleas Koch, RN, BSN    Virtual Visit No    Medication changes reported     No    Fall or balance concerns reported    No    Tobacco Cessation No Change    Warm-up and Cool-down Performed as group-led instruction    Resistance Training Performed Yes    VAD Patient? No    PAD/SET Patient? No      Pain Assessment   Currently in Pain? No/denies    Pain Score 0-No pain    Multiple Pain Sites No             Capillary Blood Glucose: No results found for this or any previous visit (from the past 24 hour(s)).    Social History   Tobacco Use  Smoking Status Former   Packs/day: .25   Types: Cigarettes   Passive exposure: Past  Smokeless Tobacco Never    Goals Met:  Independence with exercise equipment Exercise tolerated well No report of concerns or symptoms today Strength training completed today  Goals Unmet:  Not Applicable  Comments: Check out 1200.   Dr. Dina Rich is Medical Director for [facility] Cardiac Rehab

## 2023-03-19 ENCOUNTER — Encounter (HOSPITAL_COMMUNITY)
Admission: RE | Admit: 2023-03-19 | Discharge: 2023-03-19 | Disposition: A | Discharge status: Home / Self Care | Payer: Medicare Other | Source: Ambulatory Visit | Attending: Internal Medicine | Admitting: Internal Medicine

## 2023-03-19 VITALS — Wt 189.6 lb

## 2023-03-19 DIAGNOSIS — I213 ST elevation (STEMI) myocardial infarction of unspecified site: Secondary | ICD-10-CM

## 2023-03-19 DIAGNOSIS — Z951 Presence of aortocoronary bypass graft: Secondary | ICD-10-CM

## 2023-03-19 NOTE — Progress Notes (Signed)
Daily Session Note  Patient Details  Name: Nicholas Dominguez MRN: [id number] Date of Birth: September 01, 1951 Referring Provider:   Flowsheet Row CARDIAC REHAB PHASE II ORIENTATION from 02/22/2023 in [facility] CARDIAC REHABILITATION  Referring Provider Lynnette Caffey       Encounter Date: 03/19/2023  Check In:  Session Check In - 03/19/23 1100       Check-In   Supervising physician immediately available to respond to emergencies [facility] MD immediately available    Physician(s) Dr. Dominga Ferry    Location [facility]    Staff Present Ross Ludwig, BS, Exercise Physiologist;Latysha Thackston Laural Benes, RN, Pleas Koch, RN, BSN    Virtual Visit No    Medication changes reported     No    Fall or balance concerns reported    No    Tobacco Cessation No Change    Warm-up and Cool-down Performed as group-led Writer Performed Yes    VAD Patient? No    PAD/SET Patient? No      Pain Assessment   Currently in Pain? No/denies    Pain Score 0-No pain    Multiple Pain Sites No             Capillary Blood Glucose: No results found for this or any previous visit (from the past 24 hour(s)).    Social History   Tobacco Use  Smoking Status Former   Packs/day: .25   Types: Cigarettes   Passive exposure: Past  Smokeless Tobacco Never    Goals Met:  Independence with exercise equipment Exercise tolerated well No report of concerns or symptoms today Strength training completed today  Goals Unmet:  Not Applicable  Comments: Check out 1200.   Dr. Dina Rich is Medical Director for [facility] Cardiac Rehab

## 2023-03-21 ENCOUNTER — Encounter (HOSPITAL_COMMUNITY)
Admission: RE | Admit: 2023-03-21 | Discharge: 2023-03-21 | Disposition: A | Discharge status: Home / Self Care | Payer: Medicare Other | Source: Ambulatory Visit | Attending: Internal Medicine | Admitting: Internal Medicine

## 2023-03-21 DIAGNOSIS — I213 ST elevation (STEMI) myocardial infarction of unspecified site: Secondary | ICD-10-CM

## 2023-03-21 DIAGNOSIS — Z951 Presence of aortocoronary bypass graft: Secondary | ICD-10-CM | POA: Diagnosis not present

## 2023-03-21 NOTE — Progress Notes (Signed)
Daily Session Note  Patient Details  Name: Nicholas Dominguez MRN: [id number] Date of Birth: [birth year] Referring Provider:   Flowsheet Row CARDIAC REHAB PHASE II ORIENTATION from 02/22/2023 in [facility] CARDIAC REHABILITATION  Referring Provider Lynnette Caffey       Encounter Date: 03/21/2023  Check In:  Session Check In - 03/21/23 1100       Check-In   Supervising physician immediately available to respond to emergencies [facility] MD immediately available    Physician(s) Dr. Dietrich Pates    Location [facility]    Staff Present Cristopher Peru MHA, MS, ACSM-CEP;Ross Ludwig, BS, Exercise Physiologist;Kamiryn Bezanson Laural Benes, RN, BSN    Virtual Visit No    Medication changes reported     No    Fall or balance concerns reported    No    Tobacco Cessation No Change    Warm-up and Cool-down Performed as group-led instruction    Resistance Training Performed Yes    VAD Patient? No    PAD/SET Patient? No      Pain Assessment   Currently in Pain? No/denies    Pain Score 0-No pain    Multiple Pain Sites No             Capillary Blood Glucose: No results found for this or any previous visit (from the past 24 hour(s)).    Social History   Tobacco Use  Smoking Status Former   Packs/day: .25   Types: Cigarettes   Passive exposure: Past  Smokeless Tobacco Never    Goals Met:  Independence with exercise equipment Exercise tolerated well No report of concerns or symptoms today Strength training completed today  Goals Unmet:  Not Applicable  Comments: Check out 1200.   Dr. Dina Rich is Medical Director for [facility] Cardiac Rehab

## 2023-03-21 NOTE — Progress Notes (Signed)
Cardiac Individual Treatment Plan  Patient Details  Name: Nicholas Dominguez MRN: [id number] Date of Birth: [birth year] Referring Provider:   Flowsheet Row CARDIAC REHAB PHASE II ORIENTATION from 02/22/2023 in [place] [facility]  Referring Provider Lynnette Caffey       Initial Encounter Date:  Flowsheet Row CARDIAC REHAB PHASE II ORIENTATION from 02/22/2023 in [place] [facility]  Date 02/22/23       Visit Diagnosis: S/P CABG x 3  ST elevation myocardial infarction ([name]), unspecified artery (HCC)  Patient's Home Medications on Admission:  Current Outpatient Medications:    Acetaminophen (TYLENOL 8 HOUR PO), Take 500 mg by mouth daily at 6 (six) AM. Pt takes 1000 mg 2 tablets once a day., Disp: , Rfl:    aspirin 81 MG EC tablet, Take 81 mg by mouth daily., Disp: , Rfl:    atorvastatin (LIPITOR) 80 MG tablet, Take 1 tablet (80 mg total) by mouth daily., Disp: 90 tablet, Rfl: 3   clopidogrel (PLAVIX) 75 MG tablet, Take 1 tablet (75 mg total) by mouth daily., Disp: 90 tablet, Rfl: 3   docusate (COLACE) 50 MG/5ML liquid, Take 50 mg by mouth daily., Disp: , Rfl:    Ferrous Sulfate (IRON) 325 (65 Fe) MG TABS, Take 1 tablet (325 mg total) by mouth daily. (Patient taking differently: Take 65 mg by mouth daily.), Disp: 30 tablet, Rfl: 0   Garlic 1000 MG CAPS, Take 1 capsule by mouth daily., Disp: , Rfl:    metoprolol tartrate (LOPRESSOR) 25 MG tablet, Take 1 tablet (25 mg total) by mouth 2 (two) times daily., Disp: 180 tablet, Rfl: 3   Multiple Vitamin (MULTIVITAMIN) tablet, Take 1 tablet by mouth daily., Disp: , Rfl:    NIACIN PO, Take by mouth. 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   [name] (ST elevation myocardial infarction) (HCC) 11/2022   S/P CABG   Tobacco use     Tobacco Use: Social History   Tobacco Use   Smoking Status Former   Packs/day: .25   Types: Cigarettes   Passive exposure: Past  Smokeless Tobacco Never    Labs: Review Flowsheet       Latest Ref Rng & Units 12/14/2022 12/18/2022 03/14/2023  Labs for ITP Cardiac and Pulmonary Rehab  Cholestrol 0 - 200 mg/dL 409  811  - 914   LDL (calc) 0 - 99 mg/dL 782  956  - 47   HDL-C >40 mg/dL 55  65  - 51   Trlycerides <150 mg/dL 36  85  - 62   Hemoglobin A1c 4.8 - 5.6 % 5.3  5.1  - -  PH, Arterial 7.35 - 7.45 - 7.313  7.331  7.352  7.403  7.336  7.375  7.44  -  PCO2 arterial 32 - 48 mmHg - 43.8  42.0  41.3  32.9  42.7  40.5  40  -  Bicarbonate 20.0 - 28.0 mmol/L - 22.1  22.1  23.1  20.5  22.8  23.7  27.2  -  TCO2 22 - 32 mmol/L - 23  23  24  20  21  25  25  24  25  25  25   -  Acid-base deficit 0.0 - 2.0 mmol/L - 4.0  4.0  3.0  4.0  3.0  1.0  -  O2 Saturation % -  96  98  97  100  100  100  97.8  -    Capillary Blood Glucose: Lab Results  Component Value Date   GLUCAP 99 12/23/2022   GLUCAP 94 12/23/2022   GLUCAP 107 (H) 12/23/2022   GLUCAP 110 (H) 12/22/2022   GLUCAP 100 (H) 12/22/2022     Exercise Target Goals: Exercise Program Goal: Individual exercise prescription set using results from initial 6 min walk test and THRR while considering  patient's activity barriers and safety.   Exercise Prescription Goal: Starting with aerobic activity 30 plus minutes a day, 3 days per week for initial exercise prescription. Provide home exercise prescription and guidelines that participant acknowledges understanding prior to discharge.  Activity Barriers & Risk Stratification:  Activity Barriers & Cardiac Risk Stratification - 02/22/23 0829       Activity Barriers & Cardiac Risk Stratification   Activity Barriers None    Cardiac Risk Stratification High             6 Minute Walk:  6 Minute Walk     Row Name 02/22/23 0923         6 Minute Walk   Phase Initial     Distance 1400 feet     Walk Time 6 minutes     # of  Rest Breaks 0     MPH 2.65     METS 2.8     RPE 12     VO2 Peak 9.83     Symptoms Yes (comment)     Comments L hip pain (6/10) when walking extended time     Resting HR 65 bpm     Resting BP 110/52     Resting Oxygen Saturation  95 %     Exercise Oxygen Saturation  during 6 min walk 96 %     Max Ex. HR 80 bpm     Max Ex. BP 118/54     2 Minute Post BP 108/54              Oxygen Initial Assessment:   Oxygen Re-Evaluation:   Oxygen Discharge (Final Oxygen Re-Evaluation):   Initial Exercise Prescription:  Initial Exercise Prescription - 02/22/23 0900       Date of Initial Exercise RX and Referring Provider   Date 02/22/23    Referring Provider Thukkani    Expected Discharge Date 05/16/23      Treadmill   MPH 1.3    Grade 0    Minutes 17      NuStep   Level 1    SPM 60    Minutes 22      Prescription Details   Frequency (times per week) 3    Duration Progress to 30 minutes of continuous aerobic without signs/symptoms of physical distress      Intensity   THRR 40-80% of Max Heartrate 59-118    Ratings of Perceived Exertion 11-13      Resistance Training   Training Prescription Yes    Weight 3    Reps 10-15             Perform Capillary Blood Glucose checks as needed.  Exercise Prescription Changes:   Exercise Prescription Changes     Row Name 03/05/23 1400 03/19/23 1200           Response to Exercise   Blood Pressure (Admit) 110/50 124/64      Blood Pressure (Exercise) 126/60 140/60      Blood Pressure (Exit) 110/60  116/60      Heart Rate (Admit) 62 bpm 60 bpm      Heart Rate (Exercise) 77 bpm 85 bpm      Heart Rate (Exit) 69 bpm 69 bpm      Rating of Perceived Exertion (Exercise) 12 13      Duration Continue with 30 min of aerobic exercise without signs/symptoms of physical distress. Continue with 30 min of aerobic exercise without signs/symptoms of physical distress.      Intensity THRR unchanged THRR unchanged        Progression    Progression Continue to progress workloads to maintain intensity without signs/symptoms of physical distress. Continue to progress workloads to maintain intensity without signs/symptoms of physical distress.        Resistance Training   Training Prescription Yes Yes      Weight 4 5      Reps 10-15 10-15      Time 10 Minutes 10 Minutes        Treadmill   MPH 2.5 3      Grade 0 0      Minutes 17 17      METs 2.91 3.3        NuStep   Level 2 3      SPM 95 99      Minutes 22 22      METs 2.47 2.7               Exercise Comments:   Exercise Goals and Review:   Exercise Goals     Row Name 02/22/23 1610 03/20/23 0914           Exercise Goals   Increase Physical Activity Yes Yes      Intervention Provide advice, education, support and counseling about physical activity/exercise needs.;Develop an individualized exercise prescription for aerobic and resistive training based on initial evaluation findings, risk stratification, comorbidities and participant's personal goals. Provide advice, education, support and counseling about physical activity/exercise needs.;Develop an individualized exercise prescription for aerobic and resistive training based on initial evaluation findings, risk stratification, comorbidities and participant's personal goals.      Expected Outcomes Short Term: Attend rehab on a regular basis to increase amount of physical activity.;Long Term: Add in home exercise to make exercise part of routine and to increase amount of physical activity.;Long Term: Exercising regularly at least 3-5 days a week. Short Term: Attend rehab on a regular basis to increase amount of physical activity.;Long Term: Add in home exercise to make exercise part of routine and to increase amount of physical activity.;Long Term: Exercising regularly at least 3-5 days a week.      Increase Strength and Stamina Yes Yes      Intervention Develop an individualized exercise prescription for  aerobic and resistive training based on initial evaluation findings, risk stratification, comorbidities and participant's personal goals.;Provide advice, education, support and counseling about physical activity/exercise needs. Develop an individualized exercise prescription for aerobic and resistive training based on initial evaluation findings, risk stratification, comorbidities and participant's personal goals.;Provide advice, education, support and counseling about physical activity/exercise needs.      Expected Outcomes Short Term: Increase workloads from initial exercise prescription for resistance, speed, and METs.;Short Term: Perform resistance training exercises routinely during rehab and add in resistance training at home;Long Term: Improve cardiorespiratory fitness, muscular endurance and strength as measured by increased METs and functional capacity ( ) Short Term: Increase workloads from initial exercise prescription for resistance, speed, and METs.;Short Term: Perform resistance  training exercises routinely during rehab and add in resistance training at home;Long Term: Improve cardiorespiratory fitness, muscular endurance and strength as measured by increased METs and functional capacity ( )      Able to understand and use rate of perceived exertion (RPE) scale Yes Yes      Intervention Provide education and explanation on how to use RPE scale Provide education and explanation on how to use RPE scale      Expected Outcomes Short Term: Able to use RPE daily in rehab to express subjective intensity level;Long Term:  Able to use RPE to guide intensity level when exercising independently Short Term: Able to use RPE daily in rehab to express subjective intensity level;Long Term:  Able to use RPE to guide intensity level when exercising independently      Knowledge and understanding of Target Heart Rate Range (THRR) Yes Yes      Intervention Provide education and explanation of THRR including how  the numbers were predicted and where they are located for reference Provide education and explanation of THRR including how the numbers were predicted and where they are located for reference      Expected Outcomes Short Term: Able to state/look up THRR;Long Term: Able to use THRR to govern intensity when exercising independently;Short Term: Able to use daily as guideline for intensity in rehab Short Term: Able to state/look up THRR;Long Term: Able to use THRR to govern intensity when exercising independently;Short Term: Able to use daily as guideline for intensity in rehab      Able to check pulse independently Yes Yes      Intervention Provide education and demonstration on how to check pulse in carotid and radial arteries.;Review the importance of being able to check your own pulse for safety during independent exercise Provide education and demonstration on how to check pulse in carotid and radial arteries.;Review the importance of being able to check your own pulse for safety during independent exercise      Expected Outcomes Short Term: Able to explain why pulse checking is important during independent exercise;Long Term: Able to check pulse independently and accurately Short Term: Able to explain why pulse checking is important during independent exercise;Long Term: Able to check pulse independently and accurately      Understanding of Exercise Prescription Yes Yes      Intervention Provide education, explanation, and written materials on patient's individual exercise prescription Provide education, explanation, and written materials on patient's individual exercise prescription      Expected Outcomes Short Term: Able to explain program exercise prescription;Long Term: Able to explain home exercise prescription to exercise independently Short Term: Able to explain program exercise prescription;Long Term: Able to explain home exercise prescription to exercise independently               Exercise  Goals Re-Evaluation :  Exercise Goals Re-Evaluation     Row Name 03/20/23 0914             Exercise Goal Re-Evaluation   Exercise Goals Review Increase Physical Activity;Able to understand and use rate of perceived exertion (RPE) scale;Increase Strength and Stamina;Knowledge and understanding of Target Heart Rate Range (THRR);Understanding of Exercise Prescription;Able to check pulse independently       Comments Pt has completed 10 sessions of cardiac rehab. He seems motivated during class and has started to increase his workloads each week. He is tolerating exercise well. He is currently exercising at 3.30 METs on the treadmill. Will continue to monitor and progress as  able.       Expected Outcomes Through exercise at rehab and home, the patient will meet their stated goals.                 Discharge Exercise Prescription (Final Exercise Prescription Changes):  Exercise Prescription Changes - 03/19/23 1200       Response to Exercise   Blood Pressure (Admit) 124/64    Blood Pressure (Exercise) 140/60    Blood Pressure (Exit) 116/60    Heart Rate (Admit) 60 bpm    Heart Rate (Exercise) 85 bpm    Heart Rate (Exit) 69 bpm    Rating of Perceived Exertion (Exercise) 13    Duration Continue with 30 min of aerobic exercise without signs/symptoms of physical distress.    Intensity THRR unchanged      Progression   Progression Continue to progress workloads to maintain intensity without signs/symptoms of physical distress.      Resistance Training   Training Prescription Yes    Weight 5    Reps 10-15    Time 10 Minutes      Treadmill   MPH 3    Grade 0    Minutes 17    METs 3.3      NuStep   Level 3    SPM 99    Minutes 22    METs 2.7             Nutrition:  Target Goals: Understanding of nutrition guidelines, daily intake of sodium 1500mg , cholesterol 200mg , calories 30% from fat and 7% or less from saturated fats, daily to have 5 or more servings of fruits  and vegetables.  Biometrics:  Pre Biometrics - 02/22/23 0925       Pre Biometrics   Height 5\' 11"  (1.803 m)    Weight 84.2 kg    Waist Circumference 39 inches    Hip Circumference 42 inches    Waist to Hip Ratio 0.93 %    BMI (Calculated) 25.9    Triceps Skinfold 12 mm    % Body Fat 25.4 %    Grip Strength 35.7 kg    Flexibility 0 in    Single Leg Stand 6 seconds              Nutrition Therapy Plan and Nutrition Goals:  Nutrition Therapy & Goals - 02/22/23 0923       Personal Nutrition Goals   Comments Patient scored 50 on his diet assessment. Handout explained and provided on healthier choices. We offer educational sessions on heart healthy nutrition with handouts and assistance with RD referral if patient is interested.      Intervention Plan   Intervention Nutrition handout(s) given to patient.    Expected Outcomes Short Term Goal: Understand basic principles of dietary content, such as calories, fat, sodium, cholesterol and nutrients.             Nutrition Assessments:  Nutrition Assessments - 02/22/23 0922       MEDFICTS Scores   Pre Score 50            MEDIFICTS Score Key: ?70 Need to make dietary changes  40-70 Heart Healthy Diet ? 40 Therapeutic Level Cholesterol Diet   Picture Your Plate Scores: <16 Unhealthy dietary pattern with much room for improvement. 41-50 Dietary pattern unlikely to meet recommendations for good health and room for improvement. 51-60 More healthful dietary pattern, with some room for improvement.  >60 Healthy dietary pattern, although there may be  some specific behaviors that could be improved.    Nutrition Goals Re-Evaluation:   Nutrition Goals Discharge (Final Nutrition Goals Re-Evaluation):   Psychosocial: Target Goals: Acknowledge presence or absence of significant depression and/or stress, maximize coping skills, provide positive support system. Participant is able to verbalize types and ability to use  techniques and skills needed for reducing stress and depression.  Initial Review & Psychosocial Screening:  Initial Psych Review & Screening - 02/22/23 0922       Initial Review   Current issues with None Identified      Family Dynamics   Good Support System? Yes      Barriers   Psychosocial barriers to participate in program There are no identifiable barriers or psychosocial needs.      Screening Interventions   Interventions Encouraged to exercise;To provide support and resources with identified psychosocial needs;Provide feedback about the scores to participant    Expected Outcomes Short Term goal: Identification and review with participant of any Quality of Life or Depression concerns found by scoring the questionnaire.             Quality of Life Scores:  Quality of Life - 02/22/23 0926       Quality of Life   Select Quality of Life      Quality of Life Scores   Health/Function Pre 29.57 %    Socioeconomic Pre 30 %    Psych/Spiritual Pre 30 %    Family Pre 30 %    GLOBAL Pre 29.81 %            Scores of 19 and below usually indicate a poorer quality of life in these areas.  A difference of  2-3 points is a clinically meaningful difference.  A difference of 2-3 points in the total score of the Quality of Life Index has been associated with significant improvement in overall quality of life, self-image, physical symptoms, and general health in studies assessing change in quality of life.  PHQ-9: Review Flowsheet       02/22/2023  Depression screen PHQ 2/9  Decreased Interest 0  Down, Depressed, Hopeless 0  PHQ - 2 Score 0  Altered sleeping 0  Tired, decreased energy 0  Change in appetite 0  Feeling bad or failure about yourself  0  Trouble concentrating 0  Moving slowly or fidgety/restless 0  Suicidal thoughts 0  PHQ-9 Score 0  Difficult doing work/chores Not difficult at all   Interpretation of Total Score  Total Score Depression Severity:  1-4 =  Minimal depression, 5-9 = Mild depression, 10-14 = Moderate depression, 15-19 = Moderately severe depression, 20-27 = Severe depression   Psychosocial Evaluation and Intervention:  Psychosocial Evaluation - 02/22/23 0926       Psychosocial Evaluation & Interventions   Interventions Stress management education;Relaxation education;Encouraged to exercise with the program and follow exercise prescription    Comments Patient has no psychosocial barriers or issues identified at his orientation visit. His PHQ-9 score was 0. He denies any depression or anxiety. He has a good support system with his wife of many years and a son and a daughter. He has 3 grandchildren. He says he had been in great health up until 2023 when he had a small bowel blockage requiring surgery and then his [name] and CABG. He feels he has handled things well. He quit smoking 09/2022 without any assistance. 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  He enjoys wood working as a hobby. He wants to participate in the program but is not sure about the drive from [place], [place]. I encouraged him to do at least half of the program and he agreed.    Expected Outcomes Patient will continue to have no psychosocial barriers or issues identified.    Continue Psychosocial Services  No Follow up required             Psychosocial Re-Evaluation:  Psychosocial Re-Evaluation     Row Name 03/12/23 1007             Psychosocial Re-Evaluation   Current issues with None Identified       Comments Patient is new to the program. He has completed 7 sessions and doing well in the program. He continues to have no psychosocial barriers identified. He seems to enjoy the sessions and demonstrates an interest in improving his heath. We will continue to monitor his progress.       Interventions Stress management education;Relaxation education;Encouraged to attend Cardiac Rehabilitation for the  exercise       Continue Psychosocial Services  No Follow up required                Psychosocial Discharge (Final Psychosocial Re-Evaluation):  Psychosocial Re-Evaluation - 03/12/23 1007       Psychosocial Re-Evaluation   Current issues with None Identified    Comments Patient is new to the program. He has completed 7 sessions and doing well in the program. He continues to have no psychosocial barriers identified. He seems to enjoy the sessions and demonstrates an interest in improving his heath. We will continue to monitor his progress.    Interventions Stress management education;Relaxation education;Encouraged to attend Cardiac Rehabilitation for the exercise    Continue Psychosocial Services  No Follow up required             Vocational Rehabilitation: Provide vocational rehab assistance to qualifying candidates.   Vocational Rehab Evaluation & Intervention:  Vocational Rehab - 02/22/23 0924       Initial Vocational Rehab Evaluation & Intervention   Assessment shows need for Vocational Rehabilitation No      Vocational Rehab Re-Evaulation   Comments Patient is retired and does not need vocational rehab.             Education: Education Goals: Education classes will be provided on a weekly basis, covering required topics. Participant will state understanding/return demonstration of topics presented.  Learning Barriers/Preferences:  Learning Barriers/Preferences - 02/22/23 0924       Learning Barriers/Preferences   Learning Barriers Hearing    Learning Preferences Skilled Demonstration;Pictoral             Education Topics: Hypertension, Hypertension Reduction -Define heart disease and high blood pressure. Discus how high blood pressure affects the body and ways to reduce high blood pressure.   Exercise and Your Heart -Discuss why it is important to exercise, the FITT principles of exercise, normal and abnormal responses to exercise, and how to  exercise safely.   Angina -Discuss definition of angina, causes of angina, treatment of angina, and how to decrease risk of having angina.   Cardiac Medications -Review what the following cardiac medications are used for, how they affect the body, and side effects that may occur when taking the medications.  Medications include Aspirin, Beta blockers, calcium channel blockers, ACE Inhibitors, angiotensin receptor blockers, diuretics, digoxin, and antihyperlipidemics.   Congestive Heart Failure -Discuss the definition of CHF, how  to live with CHF, the signs and symptoms of CHF, and how keep track of weight and sodium intake. Flowsheet Row CARDIAC REHAB PHASE II EXERCISE from 03/14/2023 in [place] C[facility]IAC [facility]IT[facility]  Date 02/28/23  Educator HB  Instruction Review Code 1- Verbalizes Understanding       Heart Disease and Intimacy -Discus the effect sexual activity has on the heart, how changes occur during intimacy as we age, and safety during sexual activity. Flowsheet Row CARDIAC REHAB PHASE II EXERCISE from 03/14/2023 in [place] C[facility]IAC [facility]  Date 03/07/23  Educator handout       Smoking Cessation / COPD -Discuss different methods to quit smoking, the health benefits of quitting smoking, and the definition of COPD. Flowsheet Row CARDIAC REHAB PHASE II EXERCISE from 03/14/2023 in [place] C[facility]IAC RE[facility]  Date 03/14/23  Educator HB  Instruction Review Code 2- Demonstrated Understanding       Nutrition I: Fats -Discuss the types of cholesterol, what cholesterol does to the heart, and how cholesterol levels can be controlled.   Nutrition II: Labels -Discuss the different components of food labels and how to read food label   Heart Parts/Heart Disease and PAD -Discuss the anatomy of the heart, the pathway of blood circulation through the heart, and these are affected by heart disease.   Stress I: Signs and Symptoms -Discuss the causes of  stress, how stress may lead to anxiety and depression, and ways to limit stress.   Stress II: Relaxation -Discuss different types of relaxation techniques to limit stress.   Warning Signs of Stroke / TIA -Discuss definition of a stroke, what the signs and symptoms are of a stroke, and how to identify when someone is having stroke.   Knowledge Questionnaire Score:  Knowledge Questionnaire Score - 02/22/23 0924       Knowledge Questionnaire Score   Pre Score 20/24             Core Components/Risk Factors/Patient Goals at Admission:  Personal Goals and Risk Factors at Admission - 02/22/23 0924       Core Components/Risk Factors/Patient Goals on Admission    Weight Management Weight Maintenance    Lipids Yes    Intervention Provide education and support for participant on nutrition & aerobic/resistive exercise along with prescribed medications to achieve LDL 70mg , HDL >40mg .    Expected Outcomes Short Term: Participant states understanding of desired cholesterol values and is compliant with medications prescribed. Participant is following exercise prescription and nutrition guidelines.;Long Term: Cholesterol controlled with medications as prescribed, with individualized exercise RX and with personalized nutrition plan. Value goals: LDL < 70mg , HDL > 40 mg.    Personal Goal Other Yes    Personal Goal Patient wants to increase his strength and stamina and learn what he can do for exercise and activities.    Intervention Patient will attend CR 3 days/week with exercise and education.    Expected Outcomes Patient will complete the program meeting both personal and program goals.             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 He has multiple risk factors for CAD and is participating in the program for  risk modification. He has completed 7 sessions. His current weight is 187.2 lbs up 2.2 lbs from his orientation weight. His blood pressure is well controlled. His personal goals for the program are to improve his strength and stamina and learn what he is able to do. We will continue to monitor his progress as he works towards meeting these goals.       Expected Outcomes Patient will complete the program meeting both personal and program goals.                Core Components/Risk Factors/Patient Goals at Discharge (Final Review):   Goals and Risk Factor Review - 03/12/23 1008       Core Components/Risk Factors/Patient Goals Review   Personal Goals Review Weight Management/Obesity;Lipids;Other    Review Patient was referred to CR with CABGx3 and [name]. He has multiple risk factors for CAD and is participating in the program for risk modification. He has completed 7 sessions. His current weight is 187.2 lbs up 2.2 lbs from his orientation weight. His blood pressure is well controlled. His personal goals for the program are to improve his strength and stamina and learn what he is able to do. We will continue to monitor his progress as he works towards meeting these goals.    Expected Outcomes Patient will complete the program meeting both personal and program goals.             ITP Comments:   Comments: ITP REVIEW Pt is making expected progress toward Cardiac Rehab goals after completing 10 sessions. Recommend continued exercise, life style modification, education, and increased stamina and strength.

## 2023-03-23 ENCOUNTER — Encounter (HOSPITAL_COMMUNITY)
Admission: RE | Admit: 2023-03-23 | Discharge: 2023-03-23 | Disposition: A | Discharge status: Home / Self Care | Payer: Medicare Other | Source: Ambulatory Visit | Attending: Internal Medicine | Admitting: Internal Medicine

## 2023-03-23 DIAGNOSIS — Z951 Presence of aortocoronary bypass graft: Secondary | ICD-10-CM

## 2023-03-23 NOTE — Progress Notes (Signed)
Daily Session Note  Patient Details  Name: Nicholas Dominguez MRN: [id number] Date of Birth: 06/04/1951 Referring Provider:   Flowsheet Row CARDIAC REHAB PHASE II ORIENTATION from 02/22/2023 in [facility] CARDIAC REHABILITATION  Referring Provider Lynnette Caffey       Encounter Date: 03/23/2023  Check In:  Session Check In - 03/23/23 1100       Check-In   Supervising physician immediately available to respond to emergencies [facility] MD immediately available    Physician(s) Dr. Rennis Golden    Location [facility]    Staff Present Ross Ludwig, BS, Exercise Physiologist;Dalton Debbe Mounts, MS, ACSM-CEP    Virtual Visit No    Medication changes reported     No    Fall or balance concerns reported    No    Tobacco Cessation No Change    Warm-up and Cool-down Performed as group-led instruction    Resistance Training Performed Yes    VAD Patient? No    PAD/SET Patient? No      Pain Assessment   Currently in Pain? No/denies    Pain Score 0-No pain    Multiple Pain Sites No             Capillary Blood Glucose: No results found for this or any previous visit (from the past 24 hour(s)).    Social History   Tobacco Use  Smoking Status Former   Packs/day: .25   Types: Cigarettes   Passive exposure: Past  Smokeless Tobacco Never    Goals Met:  Independence with exercise equipment Exercise tolerated well No report of concerns or symptoms today Strength training completed today  Goals Unmet:  Not Applicable  Comments: check out 1200   Dr. Dina Rich is Medical Director for [facility] Cardiac Rehab

## 2023-03-26 ENCOUNTER — Encounter (HOSPITAL_COMMUNITY): Payer: Medicare Other

## 2023-03-28 ENCOUNTER — Encounter (HOSPITAL_COMMUNITY)
Admission: RE | Admit: 2023-03-28 | Discharge: 2023-03-28 | Disposition: A | Discharge status: Home / Self Care | Payer: Medicare Other | Source: Ambulatory Visit | Attending: Internal Medicine | Admitting: Internal Medicine

## 2023-03-28 DIAGNOSIS — Z951 Presence of aortocoronary bypass graft: Secondary | ICD-10-CM

## 2023-03-28 DIAGNOSIS — I213 ST elevation (STEMI) myocardial infarction of unspecified site: Secondary | ICD-10-CM

## 2023-03-28 NOTE — Progress Notes (Signed)
Daily Session Note  Patient Details  Name: Nicholas Dominguez MRN: [id number] Date of Birth: 02-23-1951 Referring Provider:   Flowsheet Row CARDIAC REHAB PHASE II ORIENTATION from 02/22/2023 in [facility] CARDIAC REHABILITATION  Referring Provider Lynnette Caffey       Encounter Date: 03/28/2023  Check In:  Session Check In - 03/28/23 1058       Check-In   Supervising physician immediately available to respond to emergencies [facility] MD immediately available    Location [facility]    Staff Present Ross Ludwig, BS, Exercise Physiologist;Noha Karasik Laural Benes, RN, Pleas Koch, RN, BSN    Virtual Visit No    Medication changes reported     No    Fall or balance concerns reported    No    Tobacco Cessation No Change    Warm-up and Cool-down Performed as group-led instruction    Resistance Training Performed Yes    VAD Patient? No    PAD/SET Patient? No      Pain Assessment   Currently in Pain? No/denies    Pain Score 0-No pain    Multiple Pain Sites No             Capillary Blood Glucose: No results found for this or any previous visit (from the past 24 hour(s)).    Social History   Tobacco Use  Smoking Status Former   Packs/day: .25   Types: Cigarettes   Passive exposure: Past  Smokeless Tobacco Never    Goals Met:  Independence with exercise equipment Exercise tolerated well No report of concerns or symptoms today Strength training completed today  Goals Unmet:  Not Applicable  Comments: Check out 1200.   Dr. Dina Rich is Medical Director for [facility] Cardiac Rehab

## 2023-03-30 ENCOUNTER — Encounter (HOSPITAL_COMMUNITY): Payer: Medicare Other

## 2023-04-02 ENCOUNTER — Encounter (HOSPITAL_COMMUNITY): Payer: Medicare Other

## 2023-04-02 ENCOUNTER — Encounter (HOSPITAL_COMMUNITY)
Admission: RE | Admit: 2023-04-02 | Discharge: 2023-04-02 | Disposition: A | Discharge status: Home / Self Care | Payer: Medicare Other | Source: Ambulatory Visit | Attending: Thoracic Surgery (Cardiothoracic Vascular Surgery) | Admitting: Thoracic Surgery (Cardiothoracic Vascular Surgery)

## 2023-04-02 VITALS — Wt 189.8 lb

## 2023-04-02 DIAGNOSIS — Z951 Presence of aortocoronary bypass graft: Secondary | ICD-10-CM | POA: Diagnosis present

## 2023-04-02 DIAGNOSIS — I213 ST elevation (STEMI) myocardial infarction of unspecified site: Secondary | ICD-10-CM | POA: Insufficient documentation

## 2023-04-02 NOTE — Progress Notes (Signed)
Daily Session Note  Patient Details  Name: Nicholas Dominguez MRN: [id number] Date of Birth: 1951/03/19 Referring Provider:   Flowsheet Row CARDIAC REHAB PHASE II ORIENTATION from 02/22/2023 in [facility] CARDIAC REHABILITATION  Referring Provider Lynnette Caffey       Encounter Date: 04/02/2023  Check In:  Session Check In - 04/02/23 1052       Check-In   Supervising physician immediately available to respond to emergencies [facility] MD immediately available    Physician(s) Dr. Dietrich Pates    Location [facility]    Staff Present Rodena Medin, RN, Pleas Koch, RN, BSN;Heather Fredric Mare, BS, Exercise Physiologist    Virtual Visit No    Medication changes reported     No    Fall or balance concerns reported    No    Tobacco Cessation No Change    Warm-up and Cool-down Performed as group-led instruction    Resistance Training Performed Yes    VAD Patient? No    PAD/SET Patient? No      Pain Assessment   Currently in Pain? No/denies    Pain Score 0-No pain    Multiple Pain Sites No             Capillary Blood Glucose: No results found for this or any previous visit (from the past 24 hour(s)).    Social History   Tobacco Use  Smoking Status Former   Packs/day: .25   Types: Cigarettes   Passive exposure: Past  Smokeless Tobacco Never    Goals Met:  Independence with exercise equipment Exercise tolerated well No report of concerns or symptoms today Strength training completed today  Goals Unmet:  Not Applicable  Comments: Check out 1200.   Dr. Dina Rich is Medical Director for [facility] Cardiac Rehab

## 2023-04-04 ENCOUNTER — Encounter (HOSPITAL_COMMUNITY)
Admission: RE | Admit: 2023-04-04 | Discharge: 2023-04-04 | Disposition: A | Discharge status: Home / Self Care | Payer: Medicare Other | Source: Ambulatory Visit | Attending: Internal Medicine | Admitting: Internal Medicine

## 2023-04-04 DIAGNOSIS — Z951 Presence of aortocoronary bypass graft: Secondary | ICD-10-CM

## 2023-04-04 DIAGNOSIS — I213 ST elevation (STEMI) myocardial infarction of unspecified site: Secondary | ICD-10-CM

## 2023-04-04 NOTE — Progress Notes (Signed)
Daily Session Note  Patient Details  Name: Nicholas Dominguez MRN: [id number] Date of Birth: June 24, 1951 Referring Provider:   Flowsheet Row CARDIAC REHAB PHASE II ORIENTATION from 02/22/2023 in [facility] CARDIAC REHABILITATION  Referring Provider Lynnette Caffey       Encounter Date: 04/04/2023  Check In:  Session Check In - 04/04/23 1057       Check-In   Supervising physician immediately available to respond to emergencies [facility] MD immediately available    Physician(s) Dr. Jenene Slicker    Location [facility]    Staff Present Ross Ludwig, BS, Exercise Physiologist;Hillary Troutman BSN, RN;Les Longmore Laural Benes, RN, BSN    Virtual Visit No    Medication changes reported     No    Fall or balance concerns reported    No    Tobacco Cessation No Change    Warm-up and Cool-down Performed as group-led instruction    Resistance Training Performed Yes    VAD Patient? No    PAD/SET Patient? No      Pain Assessment   Currently in Pain? No/denies    Pain Score 0-No pain    Multiple Pain Sites No             Capillary Blood Glucose: No results found for this or any previous visit (from the past 24 hour(s)).    Social History   Tobacco Use  Smoking Status Former   Packs/day: .25   Types: Cigarettes   Passive exposure: Past  Smokeless Tobacco Never    Goals Met:  Independence with exercise equipment Exercise tolerated well No report of concerns or symptoms today Strength training completed today  Goals Unmet:  Not Applicable  Comments: Check out 1200.   Dr. Dina Rich is Medical Director for [facility] Cardiac Rehab

## 2023-04-06 ENCOUNTER — Encounter (HOSPITAL_COMMUNITY)
Admission: RE | Admit: 2023-04-06 | Discharge: 2023-04-06 | Disposition: A | Discharge status: Home / Self Care | Payer: Medicare Other | Source: Ambulatory Visit | Attending: Internal Medicine | Admitting: Internal Medicine

## 2023-04-06 DIAGNOSIS — Z951 Presence of aortocoronary bypass graft: Secondary | ICD-10-CM

## 2023-04-06 DIAGNOSIS — I213 ST elevation (STEMI) myocardial infarction of unspecified site: Secondary | ICD-10-CM

## 2023-04-06 NOTE — Progress Notes (Signed)
Daily Session Note  Patient Details  Name: Nicholas Dominguez MRN: [id number] Date of Birth: 01/01/1951 Referring Provider:   Flowsheet Row CARDIAC REHAB PHASE II ORIENTATION from 02/22/2023 in [facility] CARDIAC REHABILITATION  Referring Provider Lynnette Caffey       Encounter Date: 04/06/2023  Check In:  Session Check In - 04/06/23 1058       Check-In   Supervising physician immediately available to respond to emergencies [facility] immediately available    Physician(s) Dr. Jenene Slicker    Location [facility]    Staff Present Erskine Speed, RN;Debra Laural Benes, RN, BSN;Other    Virtual Visit No    Medication changes reported     No    Fall or balance concerns reported    No    Tobacco Cessation No Change    Warm-up and Cool-down Performed as group-led instruction    Resistance Training Performed Yes    VAD Patient? No    PAD/SET Patient? No      Pain Assessment   Currently in Pain? No/denies    Pain Score 0-No pain    Multiple Pain Sites No             Capillary Blood Glucose: No results found for this or any previous visit (from the past 24 hour(s)).    Social History   Tobacco Use  Smoking Status Former   Packs/day: .25   Types: Cigarettes   Passive exposure: Past  Smokeless Tobacco Never    Goals Met:  Independence with exercise equipment Exercise tolerated well No report of concerns or symptoms today Strength training completed today  Goals Unmet:  Not Applicable  Comments: check out @ 12:00pm   Dr. Dina Rich is Medical Director for [facility] Cardiac Rehab

## 2023-04-09 ENCOUNTER — Encounter (HOSPITAL_COMMUNITY): Payer: Medicare Other

## 2023-04-11 ENCOUNTER — Encounter (HOSPITAL_COMMUNITY): Payer: Medicare Other

## 2023-04-13 ENCOUNTER — Encounter (HOSPITAL_COMMUNITY): Payer: Medicare Other

## 2023-04-16 ENCOUNTER — Encounter (HOSPITAL_COMMUNITY)
Admission: RE | Admit: 2023-04-16 | Discharge: 2023-04-16 | Disposition: A | Discharge status: Home / Self Care | Payer: Medicare Other | Source: Ambulatory Visit | Attending: Internal Medicine | Admitting: Internal Medicine

## 2023-04-16 VITALS — Wt 192.7 lb

## 2023-04-16 DIAGNOSIS — I213 ST elevation (STEMI) myocardial infarction of unspecified site: Secondary | ICD-10-CM

## 2023-04-16 DIAGNOSIS — Z951 Presence of aortocoronary bypass graft: Secondary | ICD-10-CM

## 2023-04-16 NOTE — Progress Notes (Signed)
Daily Session Note  Patient Details  Name: Nicholas Dominguez MRN: [id number] Date of Birth: [birth year] Referring Provider:   Flowsheet Row CARDIAC REHAB PHASE II ORIENTATION from 02/22/2023 in [facility] CARDIAC REHABILITATION  Referring Provider Lynnette Caffey       Encounter Date: 04/16/2023  Check In:  Session Check In - 04/16/23 1100       Check-In   Supervising physician immediately available to respond to emergencies [facility] MD immediately available    Physician(s) Dr. Tenny Craw    Location [facility]    Staff Present Ross Ludwig, BS, Exercise Physiologist;Debra Laural Benes, RN, BSN    Virtual Visit No    Medication changes reported     No    Fall or balance concerns reported    No    Tobacco Cessation No Change    Warm-up and Cool-down Performed as group-led instruction    Resistance Training Performed Yes    VAD Patient? No    PAD/SET Patient? No      Pain Assessment   Currently in Pain? No/denies    Pain Score 0-No pain    Multiple Pain Sites No             Capillary Blood Glucose: No results found for this or any previous visit (from the past 24 hour(s)).    Social History   Tobacco Use  Smoking Status Former   Packs/day: .25   Types: Cigarettes   Passive exposure: Past  Smokeless Tobacco Never    Goals Met:  Independence with exercise equipment Exercise tolerated well Personal goals reviewed No report of concerns or symptoms today Strength training completed today  Goals Unmet:  Not Applicable  Comments: check out 1200   Dr. Dina Rich is Medical Director for [facility] Cardiac Rehab

## 2023-04-18 ENCOUNTER — Encounter (HOSPITAL_COMMUNITY)
Admission: RE | Admit: 2023-04-18 | Discharge: 2023-04-18 | Disposition: A | Discharge status: Home / Self Care | Payer: Medicare Other | Source: Ambulatory Visit | Attending: Internal Medicine | Admitting: Internal Medicine

## 2023-04-18 DIAGNOSIS — I213 ST elevation (STEMI) myocardial infarction of unspecified site: Secondary | ICD-10-CM

## 2023-04-18 DIAGNOSIS — Z951 Presence of aortocoronary bypass graft: Secondary | ICD-10-CM

## 2023-04-18 NOTE — Progress Notes (Signed)
Daily Session Note  Patient Details  Name: Nicholas Dominguez MRN: [id number] Date of Birth: 10-08-1951 Referring Provider:   Flowsheet Row CARDIAC REHAB PHASE II ORIENTATION from 02/22/2023 in [facility] CARDIAC REHABILITATION  Referring Provider Lynnette Caffey       Encounter Date: 04/18/2023  Check In:  Session Check In - 04/18/23 1057       Check-In   Supervising physician immediately available to respond to emergencies [facility] MD immediately available    Physician(s) Dr. Diona Browner    Location [facility]    Staff Present Rodena Medin, RN, BSN;Heather Fredric Mare, BS, Exercise Physiologist    Virtual Visit No    Medication changes reported     No    Fall or balance concerns reported    No    Tobacco Cessation No Change    Warm-up and Cool-down Performed as group-led instruction    Resistance Training Performed Yes    VAD Patient? No    PAD/SET Patient? No      Pain Assessment   Currently in Pain? No/denies    Pain Score 0-No pain    Multiple Pain Sites No             Capillary Blood Glucose: No results found for this or any previous visit (from the past 24 hour(s)).    Social History   Tobacco Use  Smoking Status Former   Packs/day: .25   Types: Cigarettes   Passive exposure: Past  Smokeless Tobacco Never    Goals Met:  Independence with exercise equipment Exercise tolerated well No report of concerns or symptoms today Strength training completed today  Goals Unmet:  Not Applicable  Comments: Check out 1200.   Dr. Dina Rich is Medical Director for [facility] Cardiac Rehab

## 2023-04-18 NOTE — Progress Notes (Signed)
Cardiac Individual Treatment Plan  Patient Details  Name: Nicholas Dominguez MRN: [id number] Date of Birth: [birth year] Referring Provider:   Flowsheet Row CARDIAC REHAB PHASE II ORIENTATION from 02/22/2023 in [place] [facility]  Referring Provider Lynnette Caffey       Initial Encounter Date:  Flowsheet Row CARDIAC REHAB PHASE II ORIENTATION from 02/22/2023 in [place] C[facility]IAC [facility]  Date 02/22/23       Visit Diagnosis: S/P CABG x 3  ST elevation myocardial infarction ([name]), unspecified artery (HCC)  Patient's Home Medications on Admission:  Current Outpatient Medications:    Acetaminophen (TYLENOL 8 HOUR PO), Take 500 mg by mouth daily at 6 (six) AM. Pt takes 1000 mg 2 tablets once a day., Disp: , Rfl:    aspirin 81 MG EC tablet, Take 81 mg by mouth daily., Disp: , Rfl:    atorvastatin (LIPITOR) 80 MG tablet, Take 1 tablet (80 mg total) by mouth daily., Disp: 90 tablet, Rfl: 3   clopidogrel (PLAVIX) 75 MG tablet, Take 1 tablet (75 mg total) by mouth daily., Disp: 90 tablet, Rfl: 3   docusate (COLACE) 50 MG/5ML liquid, Take 50 mg by mouth daily., Disp: , Rfl:    Ferrous Sulfate (IRON) 325 (65 Fe) MG TABS, Take 1 tablet (325 mg total) by mouth daily. (Patient taking differently: Take 65 mg by mouth daily.), Disp: 30 tablet, Rfl: 0   Garlic 1000 MG CAPS, Take 1 capsule by mouth daily., Disp: , Rfl:    metoprolol tartrate (LOPRESSOR) 25 MG tablet, Take 1 tablet (25 mg total) by mouth 2 (two) times daily., Disp: 180 tablet, Rfl: 3   Multiple Vitamin (MULTIVITAMIN) tablet, Take 1 tablet by mouth daily., Disp: , Rfl:    NIACIN PO, Take by mouth. 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   [name] (ST elevation myocardial infarction) (HCC) 11/2022   S/P CABG   Tobacco use     Tobacco Use: Social History   Tobacco Use   Smoking Status Former   Packs/day: .25   Types: Cigarettes   Passive exposure: Past  Smokeless Tobacco Never    Labs: Review Flowsheet       Latest Ref Rng & Units 12/14/2022 12/18/2022 03/14/2023  Labs for ITP Cardiac and Pulmonary Rehab  Cholestrol 0 - 200 mg/dL 409  811  - 914   LDL (calc) 0 - 99 mg/dL 782  956  - 47   HDL-C >40 mg/dL 55  65  - 51   Trlycerides <150 mg/dL 36  85  - 62   Hemoglobin A1c 4.8 - 5.6 % 5.3  5.1  - -  PH, Arterial 7.35 - 7.45 - 7.313  7.331  7.352  7.403  7.336  7.375  7.44  -  PCO2 arterial 32 - 48 mmHg - 43.8  42.0  41.3  32.9  42.7  40.5  40  -  Bicarbonate 20.0 - 28.0 mmol/L - 22.1  22.1  23.1  20.5  22.8  23.7  27.2  -  TCO2 22 - 32 mmol/L - 23  23  24  20  21  25  25  24  25  25  25   -  Acid-base deficit 0.0 - 2.0 mmol/L - 4.0  4.0  3.0  4.0  3.0  1.0  -  O2 Saturation % -  96  98  97  100  100  100  97.8  -    Capillary Blood Glucose: Lab Results  Component Value Date   GLUCAP 99 12/23/2022   GLUCAP 94 12/23/2022   GLUCAP 107 (H) 12/23/2022   GLUCAP 110 (H) 12/22/2022   GLUCAP 100 (H) 12/22/2022     Exercise Target Goals: Exercise Program Goal: Individual exercise prescription set using results from initial 6 min walk test and THRR while considering  patient's activity barriers and safety.   Exercise Prescription Goal: Starting with aerobic activity 30 plus minutes a day, 3 days per week for initial exercise prescription. Provide home exercise prescription and guidelines that participant acknowledges understanding prior to discharge.  Activity Barriers & Risk Stratification:  Activity Barriers & Cardiac Risk Stratification - 02/22/23 0829       Activity Barriers & Cardiac Risk Stratification   Activity Barriers None    Cardiac Risk Stratification High             6 Minute Walk:  6 Minute Walk     Row Name 02/22/23 0923         6 Minute Walk   Phase Initial     Distance 1400 feet     Walk Time 6 minutes     # of  Rest Breaks 0     MPH 2.65     METS 2.8     RPE 12     VO2 Peak 9.83     Symptoms Yes (comment)     Comments L hip pain (6/10) when walking extended time     Resting HR 65 bpm     Resting BP 110/52     Resting Oxygen Saturation  95 %     Exercise Oxygen Saturation  during 6 min walk 96 %     Max Ex. HR 80 bpm     Max Ex. BP 118/54     2 Minute Post BP 108/54              Oxygen Initial Assessment:   Oxygen Re-Evaluation:   Oxygen Discharge (Final Oxygen Re-Evaluation):   Initial Exercise Prescription:  Initial Exercise Prescription - 02/22/23 0900       Date of Initial Exercise RX and Referring Provider   Date 02/22/23    Referring Provider Thukkani    Expected Discharge Date 05/16/23      Treadmill   MPH 1.3    Grade 0    Minutes 17      NuStep   Level 1    SPM 60    Minutes 22      Prescription Details   Frequency (times per week) 3    Duration Progress to 30 minutes of continuous aerobic without signs/symptoms of physical distress      Intensity   THRR 40-80% of Max Heartrate 59-118    Ratings of Perceived Exertion 11-13      Resistance Training   Training Prescription Yes    Weight 3    Reps 10-15             Perform Capillary Blood Glucose checks as needed.  Exercise Prescription Changes:   Exercise Prescription Changes     Row Name 03/05/23 1400 03/19/23 1200 04/02/23 1200 04/16/23 1200       Response to Exercise   Blood Pressure (Admit) 110/50 124/64 106/58 120/60    Blood Pressure (Exercise) 126/60 140/60 136/60 130/60    Blood Pressure (Exit) 110/60  116/60 120/60 108/60    Heart Rate (Admit) 62 bpm 60 bpm 78 bpm 54 bpm    Heart Rate (Exercise) 77 bpm 85 bpm 99 bpm 89 bpm    Heart Rate (Exit) 69 bpm 69 bpm 82 bpm 63 bpm    Rating of Perceived Exertion (Exercise) 12 13 13 13     Duration Continue with 30 min of aerobic exercise without signs/symptoms of physical distress. Continue with 30 min of aerobic exercise without  signs/symptoms of physical distress. Continue with 30 min of aerobic exercise without signs/symptoms of physical distress. Continue with 30 min of aerobic exercise without signs/symptoms of physical distress.    Intensity THRR unchanged THRR unchanged THRR unchanged THRR unchanged      Progression   Progression Continue to progress workloads to maintain intensity without signs/symptoms of physical distress. Continue to progress workloads to maintain intensity without signs/symptoms of physical distress. Continue to progress workloads to maintain intensity without signs/symptoms of physical distress. Continue to progress workloads to maintain intensity without signs/symptoms of physical distress.      Resistance Training   Training Prescription Yes Yes Yes Yes    Weight 4 5 5 5     Reps 10-15 10-15 10-15 10-15    Time 10 Minutes 10 Minutes 10 Minutes 10 Minutes      Treadmill   MPH 2.5 3 3  3.2    Grade 0 0 0 0    Minutes 17 17 17 17     METs 2.91 3.3 3.3 3.45      NuStep   Level 2 3 4 4     SPM 95 99 103 105    Minutes 22 22 22 22     METs 2.47 2.7 3.22 3.68             Exercise Comments:   Exercise Goals and Review:   Exercise Goals     Row Name 02/22/23 2440 03/20/23 0914 04/16/23 1243         Exercise Goals   Increase Physical Activity Yes Yes Yes     Intervention Provide advice, education, support and counseling about physical activity/exercise needs.;Develop an individualized exercise prescription for aerobic and resistive training based on initial evaluation findings, risk stratification, comorbidities and participant's personal goals. Provide advice, education, support and counseling about physical activity/exercise needs.;Develop an individualized exercise prescription for aerobic and resistive training based on initial evaluation findings, risk stratification, comorbidities and participant's personal goals. Provide advice, education, support and counseling about  physical activity/exercise needs.;Develop an individualized exercise prescription for aerobic and resistive training based on initial evaluation findings, risk stratification, comorbidities and participant's personal goals.     Expected Outcomes Short Term: Attend rehab on a regular basis to increase amount of physical activity.;Long Term: Add in home exercise to make exercise part of routine and to increase amount of physical activity.;Long Term: Exercising regularly at least 3-5 days a week. Short Term: Attend rehab on a regular basis to increase amount of physical activity.;Long Term: Add in home exercise to make exercise part of routine and to increase amount of physical activity.;Long Term: Exercising regularly at least 3-5 days a week. Short Term: Attend rehab on a regular basis to increase amount of physical activity.;Long Term: Add in home exercise to make exercise part of routine and to increase amount of physical activity.;Long Term: Exercising regularly at least 3-5 days a week.     Increase Strength and Stamina Yes Yes Yes     Intervention Develop an individualized exercise prescription for  aerobic and resistive training based on initial evaluation findings, risk stratification, comorbidities and participant's personal goals.;Provide advice, education, support and counseling about physical activity/exercise needs. Develop an individualized exercise prescription for aerobic and resistive training based on initial evaluation findings, risk stratification, comorbidities and participant's personal goals.;Provide advice, education, support and counseling about physical activity/exercise needs. Develop an individualized exercise prescription for aerobic and resistive training based on initial evaluation findings, risk stratification, comorbidities and participant's personal goals.;Provide advice, education, support and counseling about physical activity/exercise needs.     Expected Outcomes Short Term:  Increase workloads from initial exercise prescription for resistance, speed, and METs.;Short Term: Perform resistance training exercises routinely during rehab and add in resistance training at home;Long Term: Improve cardiorespiratory fitness, muscular endurance and strength as measured by increased METs and functional capacity ( ) Short Term: Increase workloads from initial exercise prescription for resistance, speed, and METs.;Short Term: Perform resistance training exercises routinely during rehab and add in resistance training at home;Long Term: Improve cardiorespiratory fitness, muscular endurance and strength as measured by increased METs and functional capacity ( ) Short Term: Increase workloads from initial exercise prescription for resistance, speed, and METs.;Short Term: Perform resistance training exercises routinely during rehab and add in resistance training at home;Long Term: Improve cardiorespiratory fitness, muscular endurance and strength as measured by increased METs and functional capacity ( )     Able to understand and use rate of perceived exertion (RPE) scale Yes Yes Yes     Intervention Provide education and explanation on how to use RPE scale Provide education and explanation on how to use RPE scale Provide education and explanation on how to use RPE scale     Expected Outcomes Short Term: Able to use RPE daily in rehab to express subjective intensity level;Long Term:  Able to use RPE to guide intensity level when exercising independently Short Term: Able to use RPE daily in rehab to express subjective intensity level;Long Term:  Able to use RPE to guide intensity level when exercising independently Short Term: Able to use RPE daily in rehab to express subjective intensity level;Long Term:  Able to use RPE to guide intensity level when exercising independently     Knowledge and understanding of Target Heart Rate Range (THRR) Yes Yes Yes     Intervention Provide education and  explanation of THRR including how the numbers were predicted and where they are located for reference Provide education and explanation of THRR including how the numbers were predicted and where they are located for reference Provide education and explanation of THRR including how the numbers were predicted and where they are located for reference     Expected Outcomes Short Term: Able to state/look up THRR;Long Term: Able to use THRR to govern intensity when exercising independently;Short Term: Able to use daily as guideline for intensity in rehab Short Term: Able to state/look up THRR;Long Term: Able to use THRR to govern intensity when exercising independently;Short Term: Able to use daily as guideline for intensity in rehab Short Term: Able to state/look up THRR;Long Term: Able to use THRR to govern intensity when exercising independently;Short Term: Able to use daily as guideline for intensity in rehab     Able to check pulse independently Yes Yes Yes     Intervention Provide education and demonstration on how to check pulse in carotid and radial arteries.;Review the importance of being able to check your own pulse for safety during independent exercise Provide education and demonstration on how to check pulse in carotid and radial  arteries.;Review the importance of being able to check your own pulse for safety during independent exercise Provide education and demonstration on how to check pulse in carotid and radial arteries.;Review the importance of being able to check your own pulse for safety during independent exercise     Expected Outcomes Short Term: Able to explain why pulse checking is important during independent exercise;Long Term: Able to check pulse independently and accurately Short Term: Able to explain why pulse checking is important during independent exercise;Long Term: Able to check pulse independently and accurately Short Term: Able to explain why pulse checking is important during  independent exercise;Long Term: Able to check pulse independently and accurately     Understanding of Exercise Prescription Yes Yes Yes     Intervention Provide education, explanation, and written materials on patient's individual exercise prescription Provide education, explanation, and written materials on patient's individual exercise prescription Provide education, explanation, and written materials on patient's individual exercise prescription     Expected Outcomes Short Term: Able to explain program exercise prescription;Long Term: Able to explain home exercise prescription to exercise independently Short Term: Able to explain program exercise prescription;Long Term: Able to explain home exercise prescription to exercise independently Short Term: Able to explain program exercise prescription;Long Term: Able to explain home exercise prescription to exercise independently              Exercise Goals Re-Evaluation :  Exercise Goals Re-Evaluation     Row Name 03/20/23 0914 04/16/23 1243           Exercise Goal Re-Evaluation   Exercise Goals Review Increase Physical Activity;Able to understand and use rate of perceived exertion (RPE) scale;Increase Strength and Stamina;Knowledge and understanding of Target Heart Rate Range (THRR);Understanding of Exercise Prescription;Able to check pulse independently Increase Physical Activity;Increase Strength and Stamina;Able to understand and use rate of perceived exertion (RPE) scale;Knowledge and understanding of Target Heart Rate Range (THRR);Able to check pulse independently;Understanding of Exercise Prescription      Comments Pt has completed 10 sessions of cardiac rehab. He seems motivated during class and has started to increase his workloads each week. He is tolerating exercise well. He is currently exercising at 3.30 METs on the treadmill. Will continue to monitor and progress as able. Pt has completed 17 sessions of cardiac rehab. He just come  back after being out a week on vacation and was egar to be in class. He continue to tolerate exercise well. HE is currently exercising at 3.68 METs on the stepper. Will continue to monitor and progress as able.      Expected Outcomes Through exercise at rehab and home, the patient will meet their stated goals. Through exercise at rehab and home, the patient will achieve their goals.                Discharge Exercise Prescription (Final Exercise Prescription Changes):  Exercise Prescription Changes - 04/16/23 1200       Response to Exercise   Blood Pressure (Admit) 120/60    Blood Pressure (Exercise) 130/60    Blood Pressure (Exit) 108/60    Heart Rate (Admit) 54 bpm    Heart Rate (Exercise) 89 bpm    Heart Rate (Exit) 63 bpm    Rating of Perceived Exertion (Exercise) 13    Duration Continue with 30 min of aerobic exercise without signs/symptoms of physical distress.    Intensity THRR unchanged      Progression   Progression Continue to progress workloads to maintain intensity without signs/symptoms  of physical distress.      Resistance Training   Training Prescription Yes    Weight 5    Reps 10-15    Time 10 Minutes      Treadmill   MPH 3.2    Grade 0    Minutes 17    METs 3.45      NuStep   Level 4    SPM 105    Minutes 22    METs 3.68             Nutrition:  Target Goals: Understanding of nutrition guidelines, daily intake of sodium 1500mg , cholesterol 200mg , calories 30% from fat and 7% or less from saturated fats, daily to have 5 or more servings of fruits and vegetables.  Biometrics:  Pre Biometrics - 02/22/23 0925       Pre Biometrics   Height 5\' 11"  (1.803 m)    Weight 84.2 kg    Waist Circumference 39 inches    Hip Circumference 42 inches    Waist to Hip Ratio 0.93 %    BMI (Calculated) 25.9    Triceps Skinfold 12 mm    % Body Fat 25.4 %    Grip Strength 35.7 kg    Flexibility 0 in    Single Leg Stand 6 seconds               Nutrition Therapy Plan and Nutrition Goals:  Nutrition Therapy & Goals - 04/09/23 0731       Nutrition Therapy   RD appointment deferred Yes      Personal Nutrition Goals   Comments We offer educational sessions on heart healthy nutrition with handouts and assistance with RD referral if patient is interested.      Intervention Plan   Intervention Nutrition handout(s) given to patient.    Expected Outcomes Short Term Goal: Understand basic principles of dietary content, such as calories, fat, sodium, cholesterol and nutrients.             Nutrition Assessments:  Nutrition Assessments - 02/22/23 0922       MEDFICTS Scores   Pre Score 50            MEDIFICTS Score Key: ?70 Need to make dietary changes  40-70 Heart Healthy Diet ? 40 Therapeutic Level Cholesterol Diet   Picture Your Plate Scores: <96 Unhealthy dietary pattern with much room for improvement. 41-50 Dietary pattern unlikely to meet recommendations for good health and room for improvement. 51-60 More healthful dietary pattern, with some room for improvement.  >60 Healthy dietary pattern, although there may be some specific behaviors that could be improved.    Nutrition Goals Re-Evaluation:   Nutrition Goals Discharge (Final Nutrition Goals Re-Evaluation):   Psychosocial: Target Goals: Acknowledge presence or absence of significant depression and/or stress, maximize coping skills, provide positive support system. Participant is able to verbalize types and ability to use techniques and skills needed for reducing stress and depression.  Initial Review & Psychosocial Screening:  Initial Psych Review & Screening - 02/22/23 0922       Initial Review   Current issues with None Identified      Family Dynamics   Good Support System? Yes      Barriers   Psychosocial barriers to participate in program There are no identifiable barriers or psychosocial needs.      Screening Interventions    Interventions Encouraged to exercise;To provide support and resources with identified psychosocial needs;Provide feedback about the scores to participant  Expected Outcomes Short Term goal: Identification and review with participant of any Quality of Life or Depression concerns found by scoring the questionnaire.             Quality of Life Scores:  Quality of Life - 02/22/23 0926       Quality of Life   Select Quality of Life      Quality of Life Scores   Health/Function Pre 29.57 %    Socioeconomic Pre 30 %    Psych/Spiritual Pre 30 %    Family Pre 30 %    GLOBAL Pre 29.81 %            Scores of 19 and below usually indicate a poorer quality of life in these areas.  A difference of  2-3 points is a clinically meaningful difference.  A difference of 2-3 points in the total score of the Quality of Life Index has been associated with significant improvement in overall quality of life, self-image, physical symptoms, and general health in studies assessing change in quality of life.  PHQ-9: Review Flowsheet       02/22/2023  Depression screen PHQ 2/9  Decreased Interest 0  Down, Depressed, Hopeless 0  PHQ - 2 Score 0  Altered sleeping 0  Tired, decreased energy 0  Change in appetite 0  Feeling bad or failure about yourself  0  Trouble concentrating 0  Moving slowly or fidgety/restless 0  Suicidal thoughts 0  PHQ-9 Score 0  Difficult doing work/chores Not difficult at all   Interpretation of Total Score  Total Score Depression Severity:  1-4 = Minimal depression, 5-9 = Mild depression, 10-14 = Moderate depression, 15-19 = Moderately severe depression, 20-27 = Severe depression   Psychosocial Evaluation and Intervention:  Psychosocial Evaluation - 02/22/23 0926       Psychosocial Evaluation & Interventions   Interventions Stress management education;Relaxation education;Encouraged to exercise with the program and follow exercise prescription    Comments Patient  has no psychosocial barriers or issues identified at his orientation visit. His PHQ-9 score was 0. He denies any depression or anxiety. He has a good support system with his wife of many years and a son and a daughter. He has 3 grandchildren. He says he had been in great health up until 2023 when he had a small bowel blockage requiring surgery and then his [name] and CABG. He feels he has handled things well. He quit smoking 09/2022 without any assistance. 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 He enjoys wood working as a hobby. He wants to participate in the program but is not sure about the drive from [place], [place]. I encouraged him to do at least half of the program and he agreed.    Expected Outcomes Patient will continue to have no psychosocial barriers or issues identified.    Continue Psychosocial Services  No Follow up required             Psychosocial Re-Evaluation:  Psychosocial Re-Evaluation     Row Name 03/12/23 1007 04/09/23 0702           Psychosocial Re-Evaluation   Current issues with None Identified None Identified      Comments Patient is new to the program. He has completed 7 sessions and doing well in the program. He continues to have no psychosocial barriers identified. He seems to enjoy the sessions and demonstrates an interest in improving his heath.  We will continue to monitor his progress. Patient has completed 16 sessions and continues to do well in the program. He continues to have no psychosocial barriers identified. He continues to enjoy the sessions and demonstrates an interest in improving his heath. We will continue to monitor his progress.      Expected Outcomes -- Patient will continue to have no psychosocial barriers identified.      Interventions Stress management education;Relaxation education;Encouraged to attend Cardiac Rehabilitation for the exercise Stress management education;Relaxation  education;Encouraged to attend Cardiac Rehabilitation for the exercise      Continue Psychosocial Services  No Follow up required No Follow up required               Psychosocial Discharge (Final Psychosocial Re-Evaluation):  Psychosocial Re-Evaluation - 04/09/23 0702       Psychosocial Re-Evaluation   Current issues with None Identified    Comments Patient has completed 16 sessions and continues to do well in the program. He continues to have no psychosocial barriers identified. He continues to enjoy the sessions and demonstrates an interest in improving his heath. We will continue to monitor his progress.    Expected Outcomes Patient will continue to have no psychosocial barriers identified.    Interventions Stress management education;Relaxation education;Encouraged to attend Cardiac Rehabilitation for the exercise    Continue Psychosocial Services  No Follow up required             Vocational Rehabilitation: Provide vocational rehab assistance to qualifying candidates.   Vocational Rehab Evaluation & Intervention:  Vocational Rehab - 02/22/23 0924       Initial Vocational Rehab Evaluation & Intervention   Assessment shows need for Vocational Rehabilitation No      Vocational Rehab Re-Evaulation   Comments Patient is retired and does not need vocational rehab.             Education: Education Goals: Education classes will be provided on a weekly basis, covering required topics. Participant will state understanding/return demonstration of topics presented.  Learning Barriers/Preferences:  Learning Barriers/Preferences - 02/22/23 0924       Learning Barriers/Preferences   Learning Barriers Hearing    Learning Preferences Skilled Demonstration;Pictoral             Education Topics: Hypertension, Hypertension Reduction -Define heart disease and high blood pressure. Discus how high blood pressure affects the body and ways to reduce high blood  pressure.   Exercise and Your Heart -Discuss why it is important to exercise, the FITT principles of exercise, normal and abnormal responses to exercise, and how to exercise safely.   Angina -Discuss definition of angina, causes of angina, treatment of angina, and how to decrease risk of having angina.   Cardiac Medications -Review what the following cardiac medications are used for, how they affect the body, and side effects that may occur when taking the medications.  Medications include Aspirin, Beta blockers, calcium channel blockers, ACE Inhibitors, angiotensin receptor blockers, diuretics, digoxin, and antihyperlipidemics.   Congestive Heart Failure -Discuss the definition of CHF, how to live with CHF, the signs and symptoms of CHF, and how keep track of weight and sodium intake. Flowsheet Row CARDIAC REHAB PHASE II EXERCISE from 04/04/2023 in [place] C[facility]AC [facility]  Date 02/28/23  Educator HB  Instruction Review Code 1- Verbalizes Understanding       Heart Disease and Intimacy -Discus the effect sexual activity has on the heart, how changes occur during intimacy as we age, and safety during  sexual activity. Flowsheet Row CARDIAC REHAB PHASE II EXERCISE from 04/04/2023 in [place] C[facility]IAC [facility]  Date 03/07/23  Educator handout       Smoking Cessation / COPD -Discuss different methods to quit smoking, the health benefits of quitting smoking, and the definition of COPD. Flowsheet Row CARDIAC REHAB PHASE II EXERCISE from 04/04/2023 in [place] C[facility]IAC [facility]  Date 03/14/23  Educator HB  Instruction Review Code 2- Demonstrated Understanding       Nutrition I: Fats -Discuss the types of cholesterol, what cholesterol does to the heart, and how cholesterol levels can be controlled. Flowsheet Row CARDIAC REHAB PHASE II EXERCISE from 04/04/2023 in [place] [facility]  Date 03/21/23  Educator HB  Instruction Review Code 1- Verbalizes  Understanding       Nutrition II: Labels -Discuss the different components of food labels and how to read food label Flowsheet Row CARDIAC REHAB PHASE II EXERCISE from 04/04/2023 in [facility] [facility]  Date 03/28/23  Educator HB  Instruction Review Code 1- Verbalizes Understanding       Heart Parts/Heart Disease and PAD -Discuss the anatomy of the heart, the pathway of blood circulation through the heart, and these are affected by heart disease. Flowsheet Row CARDIAC REHAB PHASE II EXERCISE from 04/04/2023 in [place] C[facility]IAC [facility]  Date 04/04/23  Educator DJ  Instruction Review Code 1- Verbalizes Understanding       Stress I: Signs and Symptoms -Discuss the causes of stress, how stress may lead to anxiety and depression, and ways to limit stress.   Stress II: Relaxation -Discuss different types of relaxation techniques to limit stress.   Warning Signs of Stroke / TIA -Discuss definition of a stroke, what the signs and symptoms are of a stroke, and how to identify when someone is having stroke.   Knowledge Questionnaire Score:  Knowledge Questionnaire Score - 02/22/23 0924       Knowledge Questionnaire Score   Pre Score 20/24             Core Components/Risk Factors/Patient Goals at Admission:  Personal Goals and Risk Factors at Admission - 02/22/23 0924       Core Components/Risk Factors/Patient Goals on Admission    Weight Management Weight Maintenance    Lipids Yes    Intervention Provide education and support for participant on nutrition & aerobic/resistive exercise along with prescribed medications to achieve LDL 70mg , HDL >40mg .    Expected Outcomes Short Term: Participant states understanding of desired cholesterol values and is compliant with medications prescribed. Participant is following exercise prescription and nutrition guidelines.;Long Term: Cholesterol controlled with medications as prescribed, with individualized  exercise RX and with personalized nutrition plan. Value goals: LDL < 70mg , HDL > 40 mg.    Personal Goal Other Yes    Personal Goal Patient wants to increase his strength and stamina and learn what he can do for exercise and activities.    Intervention Patient will attend CR 3 days/week with exercise and education.    Expected Outcomes Patient will complete the program meeting both personal and program goals.             Core Components/Risk Factors/Patient Goals Review:   Goals and Risk Factor Review     Row Name 03/12/23 1008 04/09/23 0702           Core Components/Risk Factors/Patient Goals Review   Personal Goals Review Weight Management/Obesity;Lipids;Other Weight Management/Obesity;Lipids;Other      Review Patient was referred to CR  with CABGx3 and [name]. He has multiple risk factors for CAD and is participating in the program for risk modification. He has completed 7 sessions. His current weight is 187.2 lbs up 2.2 lbs from his orientation weight. His blood pressure is well controlled. His personal goals for the program are to improve his strength and stamina and learn what he is able to do. We will continue to monitor his progress as he works towards meeting these goals. Patient has completed 16 sessions. His current weight is 189.9 lbs up 2.7 lbs since last 30 day review. He is doing well in the program with consistent attendance and progressions. His blood pressure continues to be at goal. He saw cardiology seeing Nicholas Dominguez 5/13. His lipids have improved and no changes were made. His wife did report to PA that she feels patient is depressed due to him sitting around the house a lot. He does wood working and she wants him to get back to doing this. He was concerned about lifting restricitions. Marcelino Duster reached out to Dr. Cliffton Asters and patient has not lifting restrictions. His personal goals for the program are to improve his strength and stamina and learn what he is able to do. We  will continue to monitor his progress as he works towards meeting these goals.      Expected Outcomes Patient will complete the program meeting both personal and program goals. Patient will complete the program meeting both personal and program goals.               Core Components/Risk Factors/Patient Goals at Discharge (Final Review):   Goals and Risk Factor Review - 04/09/23 0702       Core Components/Risk Factors/Patient Goals Review   Personal Goals Review Weight Management/Obesity;Lipids;Other    Review Patient has completed 16 sessions. His current weight is 189.9 lbs up 2.7 lbs since last 30 day review. He is doing well in the program with consistent attendance and progressions. His blood pressure continues to be at goal. He saw cardiology seeing Nicholas Dominguez 5/13. His lipids have improved and no changes were made. His wife did report to PA that she feels patient is depressed due to him sitting around the house a lot. He does wood working and she wants him to get back to doing this. He was concerned about lifting restricitions. Marcelino Duster reached out to Dr. Cliffton Asters and patient has not lifting restrictions. His personal goals for the program are to improve his strength and stamina and learn what he is able to do. We will continue to monitor his progress as he works towards meeting these goals.    Expected Outcomes Patient will complete the program meeting both personal and program goals.             ITP Comments:   Comments: ITP REVIEW Pt is making expected progress toward Cardiac Rehab goals after completing 17 sessions. Recommend continued exercise, life style modification, education, and increased stamina and strength.

## 2023-04-20 ENCOUNTER — Encounter (HOSPITAL_COMMUNITY)
Admission: RE | Admit: 2023-04-20 | Discharge: 2023-04-20 | Disposition: A | Discharge status: Home / Self Care | Payer: Medicare Other | Source: Ambulatory Visit | Attending: Internal Medicine | Admitting: Internal Medicine

## 2023-04-20 DIAGNOSIS — I213 ST elevation (STEMI) myocardial infarction of unspecified site: Secondary | ICD-10-CM

## 2023-04-20 DIAGNOSIS — Z951 Presence of aortocoronary bypass graft: Secondary | ICD-10-CM | POA: Diagnosis not present

## 2023-04-20 NOTE — Progress Notes (Signed)
Daily Session Note  Patient Details  Name: Nicholas Dominguez MRN: [id number] Date of Birth: 09/21/1951 Referring Provider:   Flowsheet Row CARDIAC REHAB PHASE II ORIENTATION from 02/22/2023 in [facility]  Referring Provider Nicholas Dominguez       Encounter Date: 04/20/2023  Check In:  Session Check In - 04/20/23 1059       Check-In   Supervising physician immediately available to respond to emergencies [facility] MD immediately available    Physician(s) Dr. Diona Browner    Location [facility]    Staff Present Ross Ludwig, BS, Exercise Physiologist;Tyreon Frigon, RN;Daphyne Daphine Deutscher, RN, BSN    Virtual Visit No    Medication changes reported     No    Fall or balance concerns reported    No    Tobacco Cessation No Change    Warm-up and Cool-down Performed as group-led instruction    Resistance Training Performed Yes    VAD Patient? No    PAD/SET Patient? No      Pain Assessment   Currently in Pain? No/denies    Pain Score 0-No pain    Multiple Pain Sites No             Capillary Blood Glucose: No results found for this or any previous visit (from the past 24 hour(s)).    Social History   Tobacco Use  Smoking Status Former   Packs/day: .25   Types: Cigarettes   Passive exposure: Past  Smokeless Tobacco Never    Goals Met:  Independence with exercise equipment Exercise tolerated well No report of concerns or symptoms today Strength training completed today  Goals Unmet:  Not Applicable  Comments: check out 12:00   Dr. Dina Rich is Medical Director for [facility]

## 2023-04-23 ENCOUNTER — Encounter (HOSPITAL_COMMUNITY): Payer: Medicare Other

## 2023-04-25 ENCOUNTER — Encounter (HOSPITAL_COMMUNITY): Payer: Medicare Other

## 2023-04-27 ENCOUNTER — Encounter (HOSPITAL_COMMUNITY): Payer: Medicare Other

## 2023-04-30 ENCOUNTER — Encounter (HOSPITAL_COMMUNITY): Payer: Medicare Other

## 2023-05-02 ENCOUNTER — Encounter (HOSPITAL_COMMUNITY): Payer: Medicare Other

## 2023-05-03 IMAGING — CT CT ABD-PELV W/ CM
2 of 5 series · 16 of 46 positions shown, 18 images · IV contrast (Omnipaque or Isovue)
Comparison: Chest and two views of the abdomen 03/06/2022.

CLINICAL DATA: Epigastric pain, constipation and diarrhea for 1
month.

EXAM:
CT ABDOMEN AND PELVIS WITH CONTRAST
TECHNIQUE: Multidetector CT imaging of the abdomen and pelvis was performed
using the standard protocol following bolus administration of
intravenous contrast.

[Series 2: axial st · axial · 0.85mm/px · z∈[+867,+1312]mm · 13 of 101 slices shown, 15 images]
[im 6/101  soft-tissue]
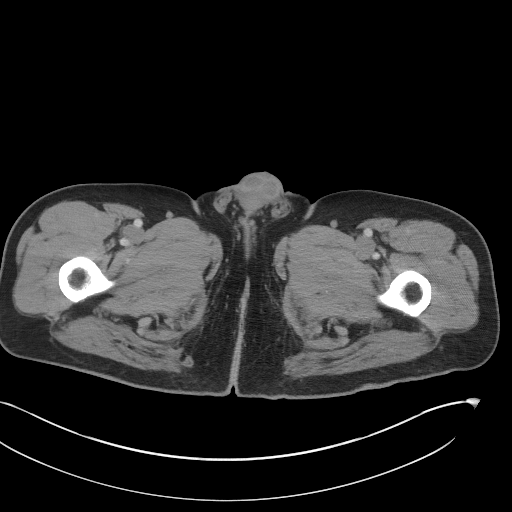
[im 6/101  bone]
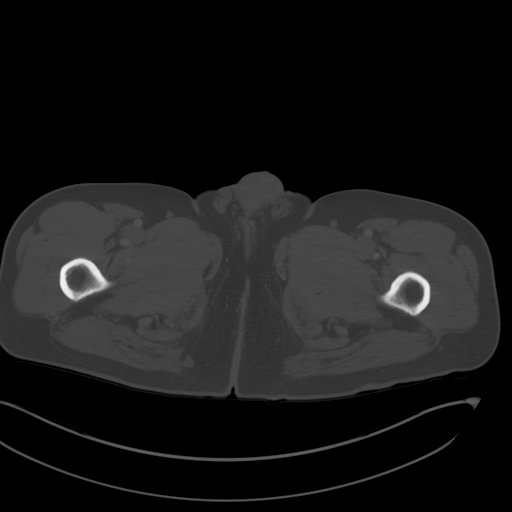
[im 12/101  soft-tissue]
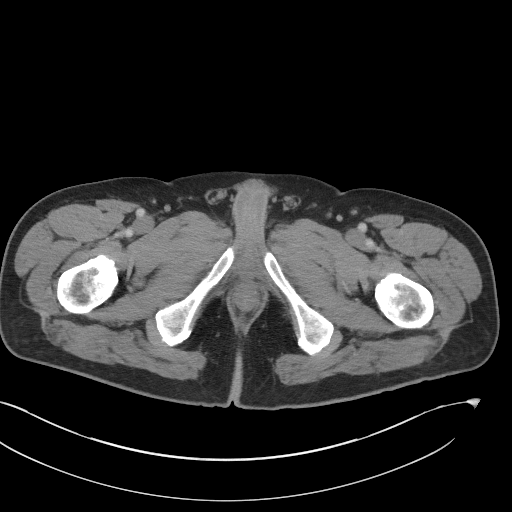
[im 23/101  soft-tissue]
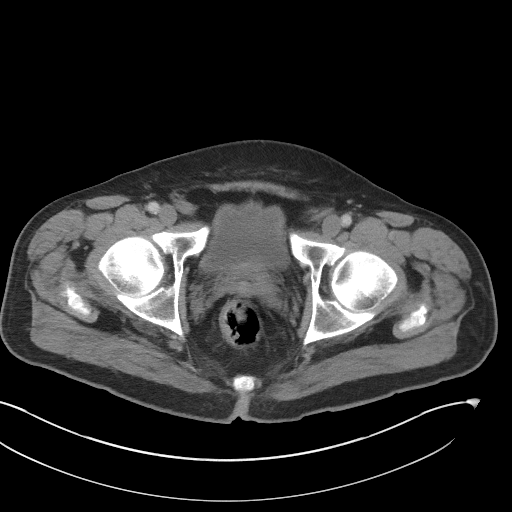
[im 28/101  soft-tissue]
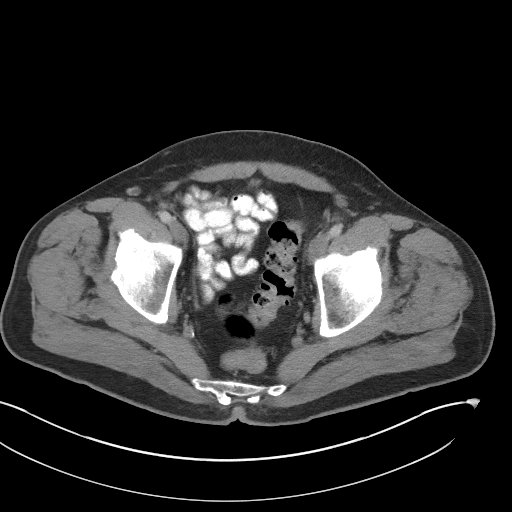
[im 34/101  soft-tissue]
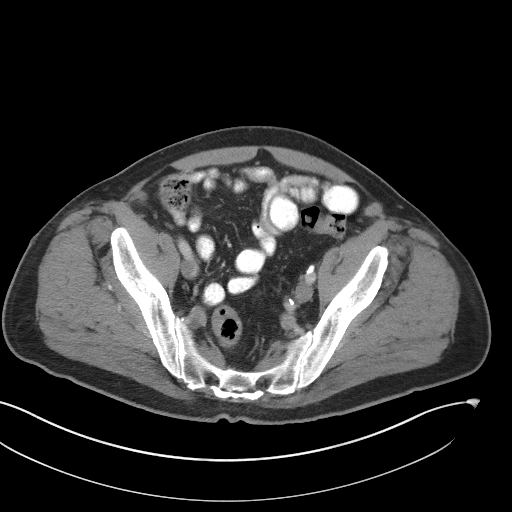
[im 45/101  soft-tissue]
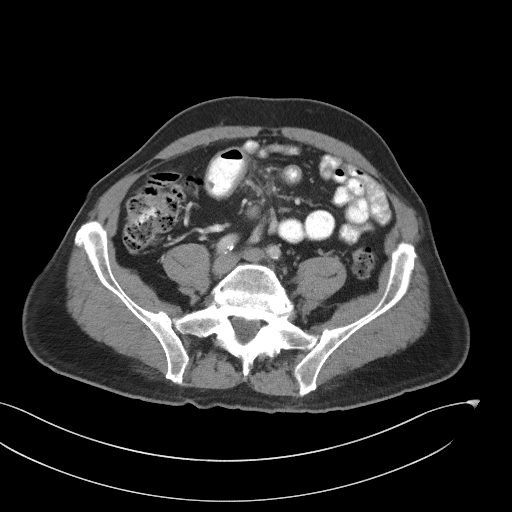
[im 51/101  soft-tissue]
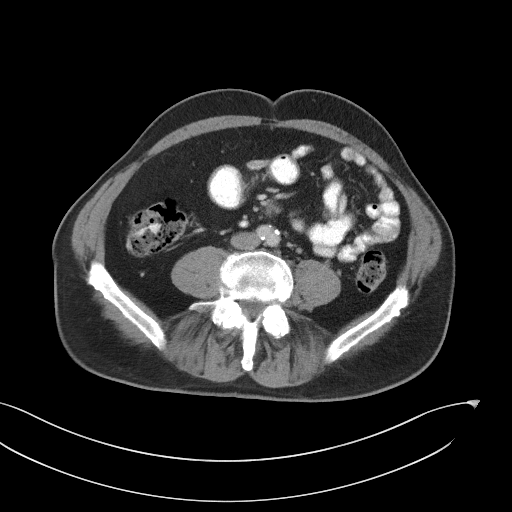
[im 56/101  soft-tissue]
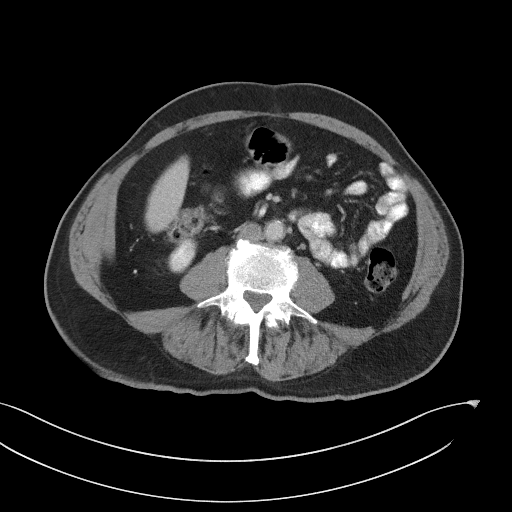
[im 67/101  soft-tissue]
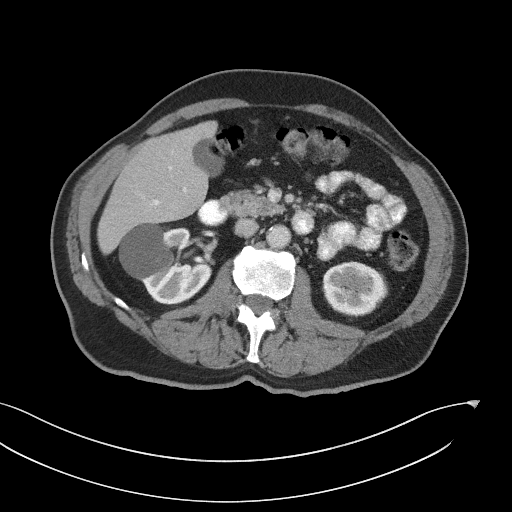
[im 67/101  bone]
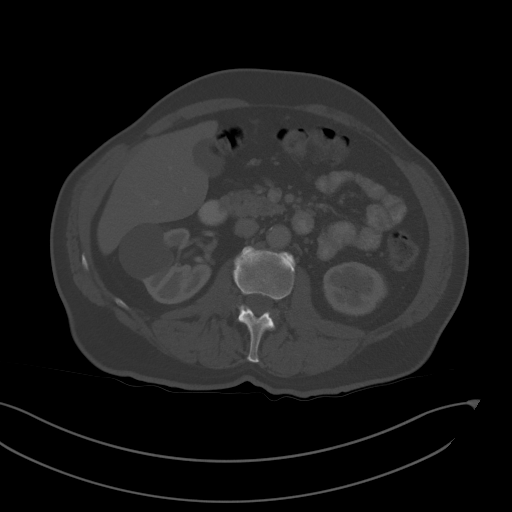
[im 73/101  soft-tissue]
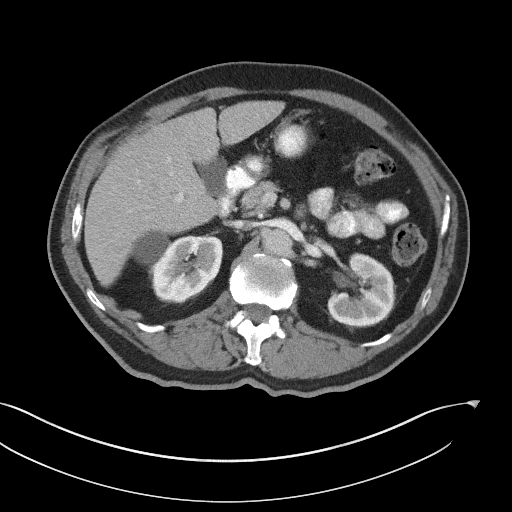
[im 78/101  soft-tissue]
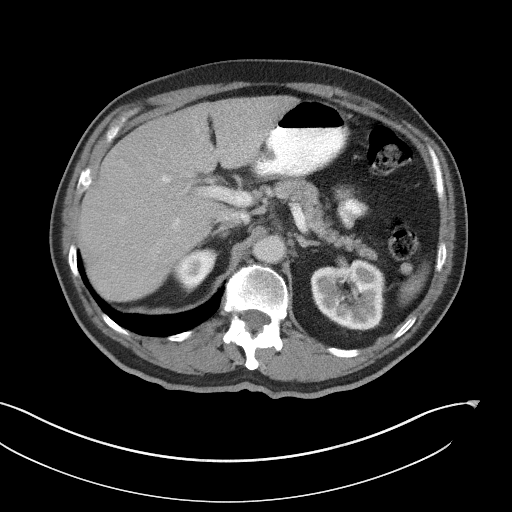
[im 89/101  soft-tissue]
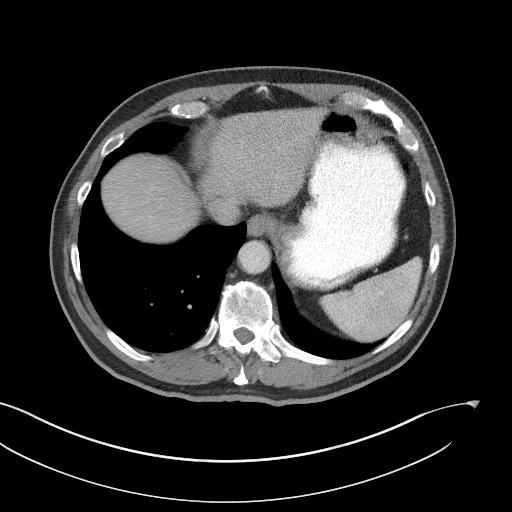
[im 95/101  soft-tissue]
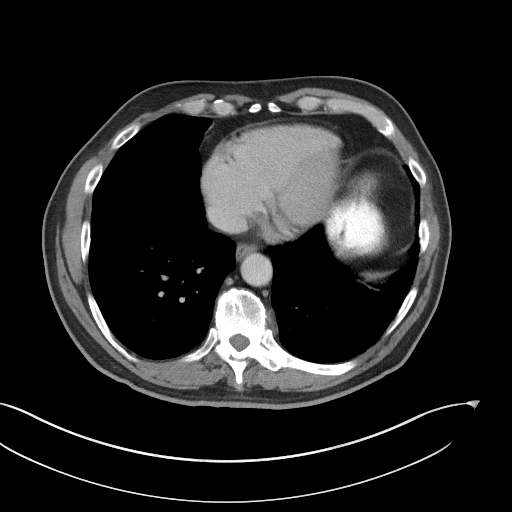

[Series 5: coronal st · coronal · 0.88mm/px · 3 of 110 slices shown]
[im 37/110  soft-tissue]
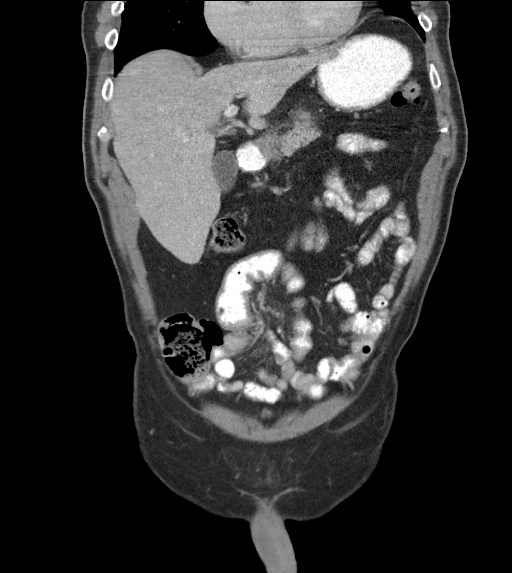
[im 49/110  soft-tissue]
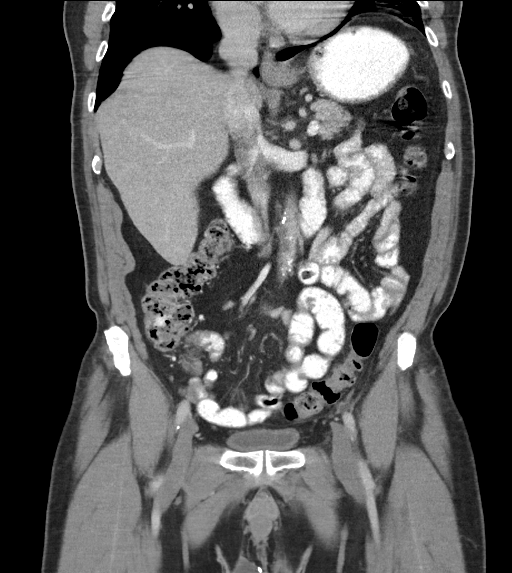
[im 61/110  soft-tissue]
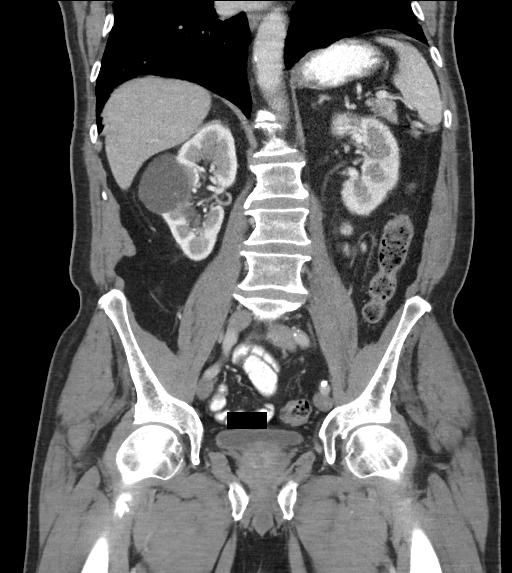

[16 of 46 positions shown; findings below may reference images not displayed]

RADIATION DOSE REDUCTION: This exam was performed according to the
departmental dose-optimization program which includes automated
exposure control, adjustment of the mA and/or kV according to
patient size and/or use of iterative reconstruction technique.

CONTRAST:  100 mL OMNIPAQUE IOHEXOL 300 MG/ML  SOLN
FINDINGS: Lower chest: Lung bases demonstrate minimal dependent atelectasis.
No pleural or pericardial effusion.

Hepatobiliary: No focal liver abnormality is seen. No gallstones,
gallbladder wall thickening, or biliary dilatation.

Pancreas: Unremarkable. No pancreatic ductal dilatation or
surrounding inflammatory changes.

Spleen: Normal in size without focal abnormality.

Adrenals/Urinary Tract: The adrenal glands and left kidney are
normal in appearance. There is a right renal cyst with a single thin
calcified septation. The cyst measures 4.8 cm. Also seen is a 2.2 cm
simple right renal cyst.

Stomach/Bowel: Stomach is within normal limits. Appendix appears
normal. No evidence of bowel wall thickening, distention, or
inflammatory changes.

Vascular/Lymphatic: Aortic atherosclerosis. No enlarged abdominal or
pelvic lymph nodes.

Reproductive: Prostate is unremarkable.

Other: None.

Musculoskeletal: No acute or focal abnormality. Convex left
thoracolumbar curvature lower lumbar degenerative change noted
IMPRESSION: No acute abnormality or finding to explain the patient's symptoms.

Aortic Atherosclerosis ([id number]).

## 2023-05-04 ENCOUNTER — Encounter (HOSPITAL_COMMUNITY): Payer: Medicare Other

## 2023-05-07 ENCOUNTER — Encounter (HOSPITAL_COMMUNITY): Payer: Medicare Other

## 2023-05-09 ENCOUNTER — Encounter (HOSPITAL_COMMUNITY): Payer: Medicare Other

## 2023-05-11 ENCOUNTER — Encounter (HOSPITAL_COMMUNITY): Payer: Medicare Other

## 2023-05-14 ENCOUNTER — Encounter (HOSPITAL_COMMUNITY): Payer: Medicare Other

## 2023-05-16 ENCOUNTER — Encounter (HOSPITAL_COMMUNITY): Payer: Medicare Other

## 2023-07-03 ENCOUNTER — Ambulatory Visit (INDEPENDENT_AMBULATORY_CARE_PROVIDER_SITE_OTHER): Payer: Medicare Other | Admitting: General Surgery

## 2023-07-03 ENCOUNTER — Encounter: Payer: Self-pay | Admitting: General Surgery

## 2023-07-03 ENCOUNTER — Encounter: Payer: Self-pay | Admitting: *Deleted

## 2023-07-03 VITALS — BP 130/73 | HR 63 | Temp 98.1°F | Resp 12 | Ht 71.0 in | Wt 198.0 lb

## 2023-07-03 DIAGNOSIS — R599 Enlarged lymph nodes, unspecified: Secondary | ICD-10-CM

## 2023-07-03 DIAGNOSIS — Z006 Encounter for examination for normal comparison and control in clinical research program: Secondary | ICD-10-CM

## 2023-07-03 NOTE — Patient Instructions (Signed)
Will call and get the CT approved and scheduled. You should hear something from the office by the end of the week. Call if you have not heard from [place] by 07/09/23.  I will call you with the results of the CT once it is done. We are doing the CT to follow up on the lymph node that was enlarged in the fat and blood vessels going to your intestines that I could not remove safely. This CT will help US show that this is not a concerning issue and hopefully has resolved or improved further since March.

## 2023-07-04 NOTE — Progress Notes (Signed)
[facility] Note   HPI:  72 y.o. 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 The bowel stricture was negative for any concerning feature but he had this enlarged node that I could not remove at the time of surgery due to the proximity of the SMA. This has been followed on CT scan and appears to be decreasing in size.   He has been in cardiac rehab and doing well.   Review of Systems:  No abdominal pain Regular Bms All other review of systems: otherwise negative   Vital Signs:  BP 130/73   Pulse 63   Temp 98.1 F (36.7 C) (Oral)   Resp 12   Ht 5\' 11"  (1.803 m)   Wt 198 lb (89.8 kg)   SpO2 94%   BMI 27.62 kg/m    Physical Exam:  Physical Exam Cardiovascular:     Rate and Rhythm: Normal rate.  Pulmonary:     Effort: Pulmonary effort is normal.  Abdominal:     General: There is no distension.     Palpations: Abdomen is soft.     Tenderness: There is no abdominal tenderness.  Musculoskeletal:     Cervical back: Normal range of motion.  Neurological:     Mental Status: He is alert.      Assessment:  72 y.o. 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 Repeat CT is due to ensure are not missing anything and this was reactive and not some lymphoma or something more concerning.  Plan:  Will call and get the CT approved and scheduled. You should hear something from the office by the end of the week. Call if you have not heard from [place] by 07/09/23.  I will call you with the results of the CT once it is done. We are doing the CT to follow up on the lymph node that was enlarged in the fat and blood vessels going to your intestines that I could not remove safely. This CT will help US show that this is not a concerning issue and hopefully has resolved or improved further since March.     Algis Greenhouse, MD [facility] [address] [place], [place] [address] [phone] (office)

## 2023-07-20 ENCOUNTER — Ambulatory Visit (HOSPITAL_COMMUNITY)
Admission: RE | Admit: 2023-07-20 | Discharge: 2023-07-20 | Disposition: A | Discharge status: Home / Self Care | Payer: Medicare Other | Source: Ambulatory Visit | Attending: General Surgery

## 2023-07-20 DIAGNOSIS — R599 Enlarged lymph nodes, unspecified: Secondary | ICD-10-CM | POA: Insufficient documentation

## 2023-07-20 MED ORDER — IOHEXOL 300 MG/ML  SOLN
100.0000 mL | Freq: Once | INTRAMUSCULAR | Status: AC | PRN
  Administered 2023-07-20: 100 mL via INTRAVENOUS

## 2023-07-25 ENCOUNTER — Ambulatory Visit: Payer: Medicare Other | Attending: Internal Medicine | Admitting: Internal Medicine

## 2023-07-25 ENCOUNTER — Encounter: Payer: Self-pay | Admitting: Internal Medicine

## 2023-07-25 VITALS — BP 120/70 | HR 56 | Ht 71.0 in | Wt 198.0 lb

## 2023-07-25 DIAGNOSIS — Z87891 Personal history of nicotine dependence: Secondary | ICD-10-CM | POA: Insufficient documentation

## 2023-07-25 DIAGNOSIS — Z951 Presence of aortocoronary bypass graft: Secondary | ICD-10-CM | POA: Diagnosis not present

## 2023-07-25 DIAGNOSIS — E7849 Other hyperlipidemia: Secondary | ICD-10-CM | POA: Insufficient documentation

## 2023-07-25 DIAGNOSIS — Z7902 Long term (current) use of antithrombotics/antiplatelets: Secondary | ICD-10-CM | POA: Insufficient documentation

## 2023-07-25 DIAGNOSIS — E785 Hyperlipidemia, unspecified: Secondary | ICD-10-CM | POA: Insufficient documentation

## 2023-07-25 DIAGNOSIS — I25810 Atherosclerosis of coronary artery bypass graft(s) without angina pectoris: Secondary | ICD-10-CM | POA: Diagnosis not present

## 2023-07-25 DIAGNOSIS — I2510 Atherosclerotic heart disease of native coronary artery without angina pectoris: Secondary | ICD-10-CM | POA: Insufficient documentation

## 2023-07-25 MED ORDER — NITROGLYCERIN 0.4 MG SL SUBL: 0.4000 mg | SUBLINGUAL_TABLET | SUBLINGUAL | 3 refills | Status: AC | PRN

## 2023-07-25 NOTE — Progress Notes (Signed)
Cardiology Office Note  Date: 07/25/2023   ID: [name], DOB 1951-06-03, MRN [id number]  PCP:  Patient, No Pcp Per  Cardiologist:  Nicholas Bicker, MD Electrophysiologist:  None   History of Present Illness: Nicholas Dominguez is a 72 y.o. male  Overall doing great, no symptoms.  No angina, DOE, orthopnea, PND, dizziness, presyncope, syncope and palpitations.  Former smoker, quit in 08/2022.  Compliant with medications and has no side effects.  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   [name] (ST elevation myocardial infarction) (HCC) 11/2022   S/P CABG   Tobacco use     Past Surgical History:  Procedure Laterality Date   BIOPSY  04/06/2022   Procedure: BIOPSY;  Surgeon: Nicholas Bal, DO;  [facility]: [facility];  Service: Endoscopy;;   BOWEL RESECTION  09/20/2022   Procedure: SMALL BOWEL RESECTION;  Surgeon: Nicholas Roers, MD;  Location: [facility] ORS;  Service: General;;   COLONOSCOPY WITH PROPOFOL N/A 04/06/2022   Procedure: COLONOSCOPY WITH PROPOFOL;  Surgeon: Nicholas Bal, DO;  Location: [facility];  Service: Endoscopy;  Laterality: N/A;  11:00am   CORONARY ANGIOGRAPHY N/A 12/14/2022   Procedure: CORONARY ANGIOGRAPHY;  Surgeon: Nicholas Pyo, MD;  Location: [facility] IN[facility];  Service: Cardiovascular;  Laterality: N/A;   CORONARY ARTERY BYPASS GRAFT N/A 12/18/2022   Procedure: CORONARY ARTERY BYPASS GRAFTING (CABG) X THREE USING LEFT INTERNAL MAMMARY ARTERY AND RIGHT GREATER SAPHENOUS LEG VEIN HARVESTED ENDOSCOPICALLY.;  Surgeon: Nicholas Skains, MD;  Location: [facility] OR;  Service: Open Heart Surgery;  Laterality: N/A;   CORONARY PRESSURE/FFR STUDY N/A 12/14/2022   Procedure: INTRAVASCULAR PRESSURE WIRE/FFR STUDY;  Surgeon: Nicholas Pyo, MD;  Location: [facility];  Service: Cardiovascular;  Laterality: N/A;   ESOPHAGOGASTRODUODENOSCOPY (EGD) WITH  PROPOFOL N/A 04/06/2022   Procedure: ESOPHAGOGASTRODUODENOSCOPY (EGD) WITH PROPOFOL;  Surgeon: Nicholas Bal, DO;  [facility];  Service: Endoscopy;  Laterality: N/A;   GIVENS CAPSULE STUDY N/A 09/04/2022   Procedure: GIVENS CAPSULE STUDY;  Surgeon: Nicholas Bal, DO;  Location[facility];  Service: Endoscopy;  Laterality: N/A;  7:30am   IR THORACENTESIS ASP PLEURAL SPACE W/IMG GUIDE  02/07/2023   LAPAROTOMY N/A 09/20/2022   Procedure: EXPLORATORY LAPAROTOMY, removal of capsule;  Surgeon: Nicholas Roers, MD;  Location: [facility];  Service: General;  Laterality: N/A;   POLYPECTOMY  04/06/2022   Procedure: POLYPECTOMY;  Surgeon: Nicholas Bal, DO;  Location[facility];  Service: Endoscopy;;   TEE WITHOUT CARDIOVERSION N/A 12/18/2022   Procedure: TRANSESOPHAGEAL ECHOCARDIOGRAM;  Surgeon: Nicholas Skains, MD;  Location: [facility] OR;  Service: Open Heart Surgery;  Laterality: N/A;   TONSILLECTOMY     VASECTOMY      Current Outpatient Medications  Medication Sig Dispense Refill   Acetaminophen (TYLENOL 8 HOUR PO) Take 500 mg by mouth daily at 6 (six) AM. Pt takes 1000 mg 2 tablets once a day.     aspirin 81 MG EC tablet Take 81 mg by mouth daily.     atorvastatin (LIPITOR) 80 MG tablet Take 1 tablet (80 mg total) by mouth daily. 90 tablet 3   clopidogrel (PLAVIX) 75 MG tablet Take 1 tablet (75 mg total) by mouth daily. 90 tablet 3   docusate (COLACE) 50 MG/5ML liquid Take 50 mg by mouth daily.  Ferrous Sulfate (IRON) 325 (65 Fe) MG TABS Take 1 tablet (325 mg total) by mouth daily. (Patient taking differently: Take 65 mg by mouth daily.) 30 tablet 0   Garlic 1000 MG CAPS Take 1 capsule by mouth daily.     metoprolol tartrate (LOPRESSOR) 25 MG tablet Take 1 tablet (25 mg total) by mouth 2 (two) times daily. 180 tablet 3   Multiple Vitamin (MULTIVITAMIN) tablet Take 1 tablet by mouth daily.     NIACIN PO Take by mouth. Otc one daily     nitroGLYCERIN (NITROSTAT) 0.4 MG  SL tablet Place 1 tablet (0.4 mg total) under the tongue every 5 (five) minutes as needed for chest pain. If a single episode of chest pain is not relieved by one tablet, the patient will try another within 5 minutes; and if this doesn't relieve the pain, the patient is instructed to call 911 for transportation to an emergency department. 25 tablet 3   No current facility-administered medications for this visit.   Allergies:  Patient has no known allergies.   Social History: The patient  reports that he has quit smoking. His smoking use included cigarettes. He has been exposed to tobacco smoke. He has never used smokeless tobacco. He reports that he does not currently use alcohol. He reports that he does not currently use drugs.   Family History: The patient's family history includes Heart disease in his mother; Prostate cancer in his father; Stroke in his father.   ROS:  Please see the history of present illness. Otherwise, complete review of systems is positive for none.  All other systems are reviewed and negative.   Physical Exam: VS:  BP 120/70   Pulse (!) 56   Ht 5\' 11"  (1.803 m)   Wt 198 lb (89.8 kg)   SpO2 95%   BMI 27.62 kg/m , BMI Body mass index is 27.62 kg/m.  Wt Readings from Last 3 Encounters:  07/25/23 198 lb (89.8 kg)  07/03/23 198 lb (89.8 kg)  04/16/23 192 lb 10.9 oz (87.4 kg)    General: Patient appears comfortable at rest. HEENT: Conjunctiva and lids normal, oropharynx clear with moist mucosa. Neck: Supple, no elevated JVP or carotid bruits, no thyromegaly. Lungs: Clear to auscultation, nonlabored breathing at rest. Cardiac: Regular rate and rhythm, no S3 or significant systolic murmur, no pericardial rub. Abdomen: Soft, nontender, no hepatomegaly, bowel sounds present, no guarding or rebound. Extremities: No pitting edema, distal pulses 2+. Skin: Warm and dry. Musculoskeletal: No kyphosis. Neuropsychiatric: Alert and oriented x3, affect grossly  appropriate.  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 Continue DAPT, stop Plavix in the end of February 2025 and continue aspirin 81 mg throughout life.  Continue atorvastatin 80 mg at bedtime. Continue metoprolol tartrate 25 mg twice daily.  SL NTG 0.4 mg as needed.  ER precautions for chest pain provided.  Post of A-fib in 11/2022: EKG today  showed NSR.  Treated with amiodarone in the postoperative setting and no recurrence since then.  No indication of systemic anticoagulation at this time.  HLD, at goal: Continue atorvastatin 80 mg nightly, goal LDL less than 70.    Medication Adjustments/Labs and Tests Ordered: Current medicines are reviewed at length with the patient today.  Concerns regarding medicines are outlined above.    Disposition:  Follow up 8 months  Signed, Kentrell Guettler Verne Spurr, MD, 07/25/2023 12:42 PM    [facility] [address], [place], [place] [address]

## 2023-07-25 NOTE — Patient Instructions (Signed)
Medication Instructions:  Your physician has recommended you make the following change in your medication:   -Stop Plavix on 12/28/2023 -SL Nitroglycerin 0.4 mg tablet. If a single episode of chest pain is not relieved by one tablet, the patient will try another within 5 minutes; and if this doesn't relieve the pain, the patient is instructed to call 911 for transportation to an emergency department.   *If you need a refill on your cardiac medications before your next appointment, please call your pharmacy*   Lab Work: None If you have labs (blood work) drawn today and your tests are completely normal, you will receive your results only by: MyChart Message (if you have MyChart) OR A paper copy in the mail If you have any lab test that is abnormal or we need to change your treatment, we will call you to review the results.   Testing/Procedures: None   Follow-Up: At [facility], you and your health needs are our priority.  As part of our continuing mission to provide you with exceptional heart care, we have created designated Provider Care Teams.  These Care Teams include your primary Cardiologist (physician) and Advanced Practice Providers (APPs -  Physician Assistants and Nurse Practitioners) who all work together to provide you with the care you need, when you need it.  We recommend signing up for the patient portal called "MyChart".  Sign up information is provided on this After Visit Summary.  MyChart is used to connect with patients for Virtual Visits (Telemedicine).  Patients are able to view lab/test results, encounter notes, upcoming appointments, etc.  Non-urgent messages can be sent to your provider as well.   To learn more about what you can do with MyChart, go to [email or web address].    Your next appointment:   8 month(s)  Provider:   You may see Vishnu P Mallipeddi, MD or one of the following Advanced Practice Providers on your designated Care Team:    [place], PA-C  Jacolyn Reedy, [place]     Other Instructions

## 2023-07-26 ENCOUNTER — Encounter: Payer: Self-pay | Admitting: *Deleted

## 2023-07-26 DIAGNOSIS — N289 Disorder of kidney and ureter, unspecified: Secondary | ICD-10-CM | POA: Insufficient documentation

## 2023-07-26 NOTE — Telephone Encounter (Signed)
This encounter was created in error - please disregard.

## 2023-07-26 NOTE — Progress Notes (Signed)
Can you let patient know that things are all stable and the node is slightly smaller. The radiologist recommends annual follow up (until 2028 (5 years stable)) for his renal cyst and we can plan to do that for him or he can have his PCP. Can we make a note to get in touch with him next year in August 2025.  The annual CT will also allow [place] to keep an eye on that lymph node.

## 2023-08-13 NOTE — Research (Signed)
24 week Follow-Up Visit Completed*   []  Not Necessary, No Potential Adverse Events Or Medication Issues Reported On Completed Subject Questionnaire   []  Yes, Contact With Subject/Alternate Contact Completed   []  Yes, No Contact With Subject/Alternate Contact Completed, But Electronic Health Record Was Reviewed   [x]  No, Unable To Contact Subject/Alternate Contact   Have you reviewed Ongoing medications on the Targeted Concomitant Medication form and updated the form as needed?   [x]  Yes   []  No   Subject Status*   [x]  Continuing In Follow-up   []  At Risk For Lost To Follow-up   []  Withdrawal From All Future Study Activities Including Passive Follow-up By Electronic Health Record Review Or Contact With Healthcare Provider Or Family Member/Friend   []  Death   Vital Status*   [x]  Alive   []  Deceased   []  Unknown   Last Known To Be Alive Source*   []  Subject Completed Follow-up Questionnaire/Seen In Person/Via Telephone Contact   []  Family Member or Caretaker   [x]  Primary Physician Or Medical Records   []  Publicly Available Source   []  Other

## 2023-09-19 ENCOUNTER — Encounter: Payer: Self-pay | Admitting: *Deleted

## 2023-09-19 DIAGNOSIS — Z006 Encounter for examination for normal comparison and control in clinical research program: Secondary | ICD-10-CM

## 2023-10-04 NOTE — Research (Signed)
72 Week Follow-Up Visit Completed*   []  Not Necessary, No Potential Adverse Events Or Medication Issues Reported On Completed Subject Questionnaire   []  Yes, Contact With Subject/Alternate Contact Completed   [x]  Yes, No Contact With Subject/Alternate Contact Completed, But Electronic Health Record Was Reviewed   []  No, Unable To Contact Subject/Alternate Contact   Have you reviewed Ongoing medications on the Targeted Concomitant Medication form and updated the form as needed?   [x]  Yes   []  No   Subject Status*   [x]  Continuing In Follow-up   []  At Risk For Lost To Follow-up   []  Withdrawal From All Future Study Activities Including Passive Follow-up By [place] Record Review Or Contact With Healthcare Provider Or Family Member/Friend   []  Death   Vital Status*   [x]  Alive   []  Deceased   []  Unknown   Last Known To Be Alive Source*   []  Subject Completed Follow-up Questionnaire/Seen In Person/Via Telephone Contact   []  Family Member or Caretaker   [x]  Primary Physician Or Medical Records   []  Publicly Available Source   []  Other

## 2023-11-09 ENCOUNTER — Encounter: Payer: Self-pay | Admitting: *Deleted

## 2023-11-09 DIAGNOSIS — Z006 Encounter for examination for normal comparison and control in clinical research program: Secondary | ICD-10-CM

## 2023-11-09 NOTE — Research (Signed)
48 Week Follow-Up Visit Completed*   []  Not Necessary, No Potential Adverse Events Or Medication Issues Reported On Completed Subject Questionnaire   [x]  Yes, Contact With Subject/Alternate Contact Completed   []  Yes, No Contact With Subject/Alternate Contact Completed, But Electronic Health Record Was Reviewed   []  No, Unable To Contact Subject/Alternate Contact   Have you reviewed Ongoing medications on the Targeted Concomitant Medication form and updated the form as needed?   [x]  Yes   []  No   Subject Status*   [x]  Continuing In Follow-up   []  At Risk For Lost To Follow-up   []  Withdrawal From All Future Study Activities Including Passive Follow-up By Electronic Health Record Review Or Contact With Healthcare Provider Or Family Member/Friend   []  Death   Vital Status*   [x]  Alive   []  Deceased   []  Unknown   Last Known To Be Alive Source*   [x]  Subject Completed Follow-up Questionnaire/Seen In Person/Via Telephone Contact   []  Family Member or Caretaker   []  Primary Physician Or Medical Records   []  Publicly Available Source   []  Other

## 2024-01-14 ENCOUNTER — Other Ambulatory Visit: Payer: Self-pay | Admitting: Nurse Practitioner

## 2024-03-11 ENCOUNTER — Encounter: Payer: Self-pay | Admitting: *Deleted

## 2024-03-11 DIAGNOSIS — Z006 Encounter for examination for normal comparison and control in clinical research program: Secondary | ICD-10-CM

## 2024-04-22 ENCOUNTER — Encounter: Payer: Self-pay | Admitting: Internal Medicine

## 2024-04-22 ENCOUNTER — Ambulatory Visit: Attending: Internal Medicine | Admitting: Internal Medicine

## 2024-04-22 VITALS — BP 130/80 | HR 56 | Ht 71.0 in | Wt 216.6 lb

## 2024-04-22 DIAGNOSIS — I25810 Atherosclerosis of coronary artery bypass graft(s) without angina pectoris: Secondary | ICD-10-CM | POA: Diagnosis not present

## 2024-04-22 DIAGNOSIS — E785 Hyperlipidemia, unspecified: Secondary | ICD-10-CM | POA: Insufficient documentation

## 2024-04-22 MED ORDER — METOPROLOL TARTRATE 25 MG PO TABS
12.5000 mg | ORAL_TABLET | Freq: Two times a day (BID) | ORAL | 3 refills | Status: AC
Start: 2024-04-22 — End: ?

## 2024-04-22 NOTE — Progress Notes (Signed)
 Cardiology Office Note  Date: 04/22/2024   ID: GINA COSTILLA, DOB 1951-06-29, MRN [id number]  PCP:  Patient, No Pcp Per  Cardiologist:  Genna Casimir P Rivkah Wolz, MD Electrophysiologist:  None   History of Present Illness: JAVOHN BASEY is a 73 y.o. male  Patient is here for follow-up visit with me. No interval ER visits or hospitalizations. Patient denied any rest or exertional chest discomfort, tightness, heaviness or pressure, rest or exertional dyspnea, palpitations, light-headedness, syncope and LE swelling. Compliant with medications and no side-effects. No bleeding complications.  Does not have PCP.  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   [name] (ST elevation myocardial infarction) (HCC) 11/2022   S/P CABG   Tobacco use     Past Surgical History:  Procedure Laterality Date   BIOPSY  04/06/2022   Procedure: BIOPSY;  Surgeon: Cindie Carlin POUR, DO;  [facility];  Service: Endoscopy;;   BOWEL RESECTION  09/20/2022   Procedure: SMALL BOWEL RESECTION;  Surgeon: Kallie Manuelita BROCKS, MD;  Location: [facility] ORS;  Service: General;;   COLONOSCOPY WITH PROPOFOL  N/A 04/06/2022   Procedure: COLONOSCOPY WITH PROPOFOL ;  Surgeon: Cindie Carlin POUR, DO;  [facility];  Service: Endoscopy;  Laterality: N/A;  11:00am   CORONARY ANGIOGRAPHY N/A 12/14/2022   Procedure: CORONARY ANGIOGRAPHY;  Surgeon: Wendel Lurena POUR, MD;  Location: [facility];  Service: Cardiovascular;  Laterality: N/A;   CORONARY ARTERY BYPASS GRAFT N/A 12/18/2022   Procedure: CORONARY ARTERY BYPASS GRAFTING (CABG) X THREE USING LEFT INTERNAL MAMMARY ARTERY AND RIGHT GREATER SAPHENOUS LEG VEIN HARVESTED ENDOSCOPICALLY.;  Surgeon: Shyrl Linnie KIDD, MD;  Location: [facility] OR;  Service: Open Heart Surgery;  Laterality: N/A;   CORONARY PRESSURE/FFR STUDY N/A 12/14/2022   Procedure: INTRAVASCULAR PRESSURE WIRE/FFR  STUDY;  Surgeon: Wendel Lurena POUR, MD;  Location: [facility];  Service: Cardiovascular;  Laterality: N/A;   ESOPHAGOGASTRODUODENOSCOPY (EGD) WITH PROPOFOL  N/A 04/06/2022   Procedure: ESOPHAGOGASTRODUODENOSCOPY (EGD) WITH PROPOFOL ;  Surgeon: Cindie Carlin POUR, DO;  [facility];  Service: Endoscopy;  Laterality: N/A;   GIVENS CAPSULE STUDY N/A 09/04/2022   Procedure: GIVENS CAPSULE STUDY;  Surgeon: Cindie Carlin POUR, DO;  [facility];  Service: Endoscopy;  Laterality: N/A;  7:30am   IR THORACENTESIS ASP PLEURAL SPACE W/IMG GUIDE  02/07/2023   LAPAROTOMY N/A 09/20/2022   Procedure: EXPLORATORY LAPAROTOMY, removal of capsule;  Surgeon: Kallie Manuelita BROCKS, MD;  Location: [facility];  Service: General;  Laterality: N/A;   POLYPECTOMY  04/06/2022   Procedure: POLYPECTOMY;  Surgeon: Cindie Carlin POUR, DO;  [facility];  Service: Endoscopy;;   TEE WITHOUT CARDIOVERSION N/A 12/18/2022   Procedure: TRANSESOPHAGEAL ECHOCARDIOGRAM;  Surgeon: Shyrl Linnie KIDD, MD;  Location: [facility];  Service: Open Heart Surgery;  Laterality: N/A;   TONSILLECTOMY     VASECTOMY      Current Outpatient Medications  Medication Sig Dispense Refill   Acetaminophen  (TYLENOL  8 HOUR PO) Take 500 mg by mouth daily at 6 (six) AM. Pt takes 1000 mg 2 tablets once a day.     aspirin  81 MG EC tablet Take 81 mg by mouth daily.     atorvastatin  (LIPITOR ) 80 MG tablet Take 1 tablet by mouth once daily 90 tablet 1   clopidogrel  (PLAVIX ) 75 MG tablet Take 1 tablet (75 mg total) by mouth daily. 90 tablet  3   docusate (COLACE) 50 MG/5ML liquid Take 50 mg by mouth daily.     Ferrous Sulfate  (IRON ) 325 (65 Fe) MG TABS Take 1 tablet (325 mg total) by mouth daily. (Patient taking differently: Take 65 mg by mouth daily.) 30 tablet 0   Garlic 1000 MG CAPS Take 1 capsule by mouth daily.     metoprolol  tartrate (LOPRESSOR ) 25 MG tablet Take 1 tablet by mouth twice daily 180 tablet 1   Multiple Vitamin  (MULTIVITAMIN) tablet Take 1 tablet by mouth daily.     NIACIN PO Take by mouth. Otc one daily     nitroGLYCERIN  (NITROSTAT ) 0.4 MG SL tablet Place 1 tablet (0.4 mg total) under the tongue every 5 (five) minutes as needed for chest pain. If a single episode of chest pain is not relieved by one tablet, the patient will try another within 5 minutes; and if this doesn't relieve the pain, the patient is instructed to call 911 for transportation to an emergency department. 25 tablet 3   No current facility-administered medications for this visit.   Allergies:  Patient has no known allergies.   Social History: The patient  reports that he has quit smoking. His smoking use included cigarettes. He has been exposed to tobacco smoke. He has never used smokeless tobacco. He reports that he does not currently use alcohol. He reports that he does not currently use drugs.   Family History: The patient's family history includes Heart disease in his mother; Prostate cancer in his father; Stroke in his father.   ROS:  Please see the history of present illness. Otherwise, complete review of systems is positive for none.  All other systems are reviewed and negative.   Physical Exam: VS:  There were no vitals taken for this visit., BMI There is no height or weight on file to calculate BMI.  Wt Readings from Last 3 Encounters:  07/25/23 198 lb (89.8 kg)  07/03/23 198 lb (89.8 kg)  04/16/23 192 lb 10.9 oz (87.4 kg)    General: Patient appears comfortable at rest. HEENT: Conjunctiva and lids normal, oropharynx clear with moist mucosa. Neck: Supple, no elevated JVP or carotid bruits, no thyromegaly. Lungs: Clear to auscultation, nonlabored breathing at rest. Cardiac: Regular rate and rhythm, no S3 or significant systolic murmur, no pericardial rub. Abdomen: Soft, nontender, no hepatomegaly, bowel sounds present, no guarding or rebound. Extremities: No pitting edema, distal pulses 2+. Skin: Warm and  dry. Musculoskeletal: No kyphosis. Neuropsychiatric: Alert and oriented x3, affect grossly appropriate.  Recent Labwork: No results found for requested labs within last 365 days.     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  Continue aspirin  81 mg once daily, atorvastatin  80 mg nightly.  SL NTG 0.4 mg as needed.  Sinus bradycardia: EKG today showed NSR, HR 51 bpm.  Asymptomatic.  Currently on metoprolol  tartrate 25 mg twice daily, will decrease the dose to 12.5 mg twice daily.  Post of A-fib in 11/2022: EKG today showed NSR.  Treated with amiodarone  in the postoperative setting and no recurrence since then.  No indication of systemic anticoagulation at this time.  HLD, at goal: Continue atorvastatin  80 mg nightly, goal LDL less than 70.  He does not have a PCP.  Will obtain fasting lipid panel tomorrow a.m.  LDL 47 in May 2024.    Medication Adjustments/Labs and Tests Ordered: Current medicines are reviewed at length with the patient today.  Concerns regarding medicines are outlined above.    Disposition:  Follow up 1 year  Signed, Jalyiah Shelley Arleta Maywood, MD, 04/22/2024 10:32 AM    [facility] [address] [place], [place] [address]

## 2024-04-22 NOTE — Patient Instructions (Addendum)
 Medication Instructions:  Your physician has recommended you make the following change in your medication:  Decrease Metoprolol  Tartrate from 25 mg to 12.5 mg twice daily Continue taking all other medications as prescribed  Labwork: Fasting Lipid Panel to be completed tomorrow at [facility] in [place]  Testing/Procedures: None   Follow-Up: Your physician recommends that you schedule a follow-up appointment in: 1 year. You will receive a reminder call in about 8 months reminding you to schedule your appointment. If you don't receive this call, please contact our office.   Any Other Special Instructions Will Be Listed Below (If Applicable). Thank you for choosing [facility]!     If you need a refill on your cardiac medications before your next appointment, please call your pharmacy.

## 2024-04-24 LAB — LIPID PANEL
Chol/HDL Ratio: 3 ratio (ref 0.0–5.0)
Cholesterol, Total: 116 mg/dL (ref 100–199)
HDL: 39 mg/dL — ABNORMAL LOW (ref >39)
LDL Chol Calc (NIH): 57 mg/dL (ref 0–99)
Triglycerides: 111 mg/dL (ref 0–149)
VLDL Cholesterol Cal: 20 mg/dL (ref 5–40)

## 2024-04-29 ENCOUNTER — Encounter: Payer: Self-pay | Admitting: *Deleted

## 2024-04-29 ENCOUNTER — Ambulatory Visit: Payer: Self-pay | Admitting: Internal Medicine

## 2024-04-29 DIAGNOSIS — Z006 Encounter for examination for normal comparison and control in clinical research program: Secondary | ICD-10-CM

## 2024-05-07 NOTE — Telephone Encounter (Signed)
 The patient has been notified of the result and verbalized understanding.  All questions (if any) were answered. Also gave her number to a PCP  Littie CHRISTELLA Croak, CMA 05/07/2024 10:11 AM

## 2024-05-07 NOTE — Telephone Encounter (Signed)
-----   Message from Vishnu P Mallipeddi sent at 04/29/2024 10:25 AM EDT ----- LDL 57 and TG 111.  Both at goal.  Continue current medications. ----- Message ----- From: Interface, [facility] Lab Results In Sent: 04/24/2024   5:37 AM EDT To: Vishnu P Mallipeddi, MD

## 2024-05-20 NOTE — Research (Signed)
 Week 60 Follow-Up Visit Completed*   []  Not Necessary, No Potential Adverse Events Or Medication Issues Reported On Completed Subject Questionnaire   []  Yes, Contact With Subject/Alternate Contact Completed   [x]  Yes, No Contact With Subject/Alternate Contact Completed, But Electronic Health Record Was Reviewed   []  No, Unable To Contact Subject/Alternate Contact   Have you reviewed Ongoing medications on the Targeted Concomitant Medication form and updated the form as needed?   []  Yes   []  No   Subject Status*   [x]  Continuing In Follow-up   []  At Risk For Lost To Follow-up   []  Withdrawal From All Future Study Activities Including Passive Follow-up By Electronic Health Record Review Or Contact With Healthcare Provider Or Family Member/Friend   []  Death   Vital Status*   [x]  Alive   []  Deceased   []  Unknown   Last Known To Be Alive Source*   []  Subject Completed Follow-up Questionnaire/Seen In Person/Via Telephone Contact   []  Family Member or Caretaker   [x]  Primary Physician Or Medical Records   []  Publicly Available Source   []  Other

## 2024-05-29 NOTE — Research (Signed)
Week 72Follow-Up Visit Completed*   []  Not Necessary, No Potential Adverse Events Or Medication Issues Reported On Completed Subject Questionnaire   []  Yes, Contact With Subject/Alternate Contact Completed   [x]  Yes, No Contact With Subject/Alternate Contact Completed, But Electronic Health Record Was Reviewed   []  No, Unable To Contact Subject/Alternate Contact   Have you reviewed Ongoing medications on the Targeted Concomitant Medication form and updated the form as needed?   [x]  Yes   []  No   Subject Status*   [x]  Continuing In Follow-up   []  At Risk For Lost To Follow-up   []  Withdrawal From All Future Study Activities Including Passive Follow-up By Electronic Health Record Review Or Contact With Healthcare Provider Or Family Member/Friend   []  Death   Vital Status*   [x]  Alive   []  Deceased   []  Unknown   Last Known To Be Alive Source*   []  Subject Completed Follow-up Questionnaire/Seen In Person/Via Telephone Contact   []  Family Member or Caretaker   [x]  Primary Physician Or Medical Records   []  Publicly Available Source   []  Other

## 2024-06-17 ENCOUNTER — Ambulatory Visit (INDEPENDENT_AMBULATORY_CARE_PROVIDER_SITE_OTHER): Admitting: General Surgery

## 2024-06-17 ENCOUNTER — Encounter: Payer: Self-pay | Admitting: General Surgery

## 2024-06-17 VITALS — BP 138/78 | HR 58 | Temp 98.0°F | Resp 12 | Ht 71.0 in | Wt 217.0 lb

## 2024-06-17 DIAGNOSIS — N289 Disorder of kidney and ureter, unspecified: Secondary | ICD-10-CM | POA: Diagnosis not present

## 2024-06-17 DIAGNOSIS — R599 Enlarged lymph nodes, unspecified: Secondary | ICD-10-CM | POA: Diagnosis not present

## 2024-06-17 NOTE — Progress Notes (Signed)
 [facility] Note   HPI:  73 y.o. 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 Patient reports no issues and no pain or issue with bowels.   Review of Systems:  Regular bms Eating normal  All other review of systems: otherwise negative   Vital Signs:  BP 138/78   Pulse (!) 58   Temp 98 F (36.7 C) (Oral)   Resp 12   Ht 5' 11 (1.803 m)   Wt 217 lb (98.4 kg)   SpO2 92%   BMI 30.27 kg/m    Physical Exam:  Physical Exam Eyes:     Extraocular Movements: Extraocular movements intact.  Cardiovascular:     Rate and Rhythm: Normal rate.  Pulmonary:     Effort: Pulmonary effort is normal.  Abdominal:     General: There is no distension.     Palpations: Abdomen is soft.     Tenderness: There is no abdominal tenderness.  Musculoskeletal:     Cervical back: Normal range of motion.  Neurological:     General: No focal deficit present.     Mental Status: He is alert.      Assessment:  73 y.o. yo Male with known mesenteric adenopathy and renal cyst. He had a history of a small bowel stricture and resection, and known mesenteric adenopathy we had been following to be safe as there was no signs of any pathology during surgery but the node was too deep to remove. The last CT showed that it was smaller.   Plan:  The last CT recommended following renal cyst for 5 years (2023- 2028).  Will get the repeat CT scan to look at your lymph node in your abdomen and your renal cyst. Will call with the results. All questions answered.   Manuelita Pander, MD [facility] [address] [place], [place] [address] [phone] (office)

## 2024-06-17 NOTE — Patient Instructions (Addendum)
 Right Sided Renal (kidney) Cyst - Stable Bosniak IIF right renal lesion seen on imaging since 03/22/2022. Recommend annual imaging follow-up until 5 years stability has been established.  Will get the repeat CT scan to look at your lymph node in your abdomen and your renal cyst. Will call with the results.

## 2024-07-07 ENCOUNTER — Ambulatory Visit (HOSPITAL_COMMUNITY)
Admission: RE | Admit: 2024-07-07 | Discharge: 2024-07-07 | Disposition: A | Discharge status: Home / Self Care | Source: Ambulatory Visit | Attending: General Surgery

## 2024-07-07 DIAGNOSIS — N289 Disorder of kidney and ureter, unspecified: Secondary | ICD-10-CM | POA: Insufficient documentation

## 2024-07-07 DIAGNOSIS — R599 Enlarged lymph nodes, unspecified: Secondary | ICD-10-CM | POA: Diagnosis present

## 2024-07-07 LAB — POCT I-STAT CREATININE: Creatinine, Ser: 1.1 mg/dL (ref 0.61–1.24)

## 2024-07-07 MED ORDER — IOHEXOL 300 MG/ML  SOLN
100.0000 mL | Freq: Once | INTRAMUSCULAR | Status: AC | PRN
  Administered 2024-07-07: 100 mL via INTRAVENOUS

## 2024-07-08 ENCOUNTER — Other Ambulatory Visit: Payer: Self-pay | Admitting: Internal Medicine

## 2024-07-15 ENCOUNTER — Encounter: Payer: Self-pay | Admitting: *Deleted

## 2024-07-15 ENCOUNTER — Ambulatory Visit (INDEPENDENT_AMBULATORY_CARE_PROVIDER_SITE_OTHER): Payer: Self-pay | Admitting: General Surgery

## 2024-07-15 DIAGNOSIS — R599 Enlarged lymph nodes, unspecified: Secondary | ICD-10-CM

## 2024-07-15 DIAGNOSIS — Z006 Encounter for examination for normal comparison and control in clinical research program: Secondary | ICD-10-CM

## 2024-07-15 DIAGNOSIS — N289 Disorder of kidney and ureter, unspecified: Secondary | ICD-10-CM

## 2024-07-15 NOTE — Progress Notes (Signed)
 Let patient know his lymph node and his kidney cyst are stable in size. We will need to follow up next year with a CT scan. Please make us  a reminder to see him and order a repeat CT scan.

## 2024-09-03 NOTE — Research (Signed)
 Week 84Follow-Up Visit Completed*   []  Not Necessary, No Potential Adverse Events Or Medication Issues Reported On Completed Subject Questionnaire   []  Yes, Contact With Subject/Alternate Contact Completed   [x]  Yes, No Contact With Subject/Alternate Contact Completed, But Electronic Health Record Was Reviewed   []  No, Unable To Contact Subject/Alternate Contact   Have you reviewed Ongoing medications on the Targeted Concomitant Medication form and updated the form as needed?   [x]  Yes   []  No   Subject Status*   [x]  Continuing In Follow-up   []  At Risk For Lost To Follow-up   []  Withdrawal From All Future Study Activities Including Passive Follow-up By Electronic Health Record Review Or Contact With Healthcare Provider Or Family Member/Friend   []  Death   Vital Status*   [x]  Alive   []  Deceased   []  Unknown   Last Known To Be Alive Source*   []  Subject Completed Follow-up Questionnaire/Seen In Person/Via Telephone Contact   []  Family Member or Caretaker   [x]  Primary Physician Or Medical Records   []  Publicly Available Source   []  Other

## 2024-10-05 ENCOUNTER — Other Ambulatory Visit: Payer: Self-pay | Admitting: Internal Medicine

## 2024-11-05 ENCOUNTER — Encounter: Payer: Self-pay | Admitting: *Deleted

## 2024-11-05 DIAGNOSIS — Z006 Encounter for examination for normal comparison and control in clinical research program: Secondary | ICD-10-CM

## 2024-11-05 NOTE — Research (Signed)
 Week 96 Follow-Up Visit Completed* Control    []  Not Necessary, No Potential Adverse Events Or Medication Issues Reported On Completed Subject Questionnaire   [x]  Yes, Contact With Subject/Alternate Contact Completed   []  Yes, No Contact With Subject/Alternate Contact Completed, But Electronic Health Record Was Reviewed   []  No, Unable To Contact Subject/Alternate Contact   Have you reviewed Ongoing medications on the Targeted Concomitant Medication form and updated the form as needed?   [x]  Yes   []  No   Subject Status*   [x]  Continuing In Follow-up   []  At Risk For Lost To Follow-up   []  Withdrawal From All Future Study Activities Including Passive Follow-up By Electronic Health Record Review Or Contact With Healthcare Provider Or Family Member/Friend   []  Death   Vital Status*   [x]  Alive   []  Deceased   []  Unknown   Last Known To Be Alive Source*   [x]  Subject Completed Follow-up Questionnaire/Seen In Person/Via Telephone Contact   []  Family Member or Caretaker   []  Primary Physician Or Medical Records   []  Publicly Available Source   []  Other
